# Patient Record
Sex: Male | Born: 1975 | Race: White | Hispanic: No | Marital: Married | Smoking: Former smoker
Health system: Southern US, Community
[De-identification: ages and names within clinical notes are randomized; demographics above are authoritative.]

## PROBLEM LIST (undated history)

## (undated) DIAGNOSIS — G35 Multiple sclerosis: Secondary | ICD-10-CM

## (undated) DIAGNOSIS — H539 Unspecified visual disturbance: Secondary | ICD-10-CM

## (undated) DIAGNOSIS — I10 Essential (primary) hypertension: Secondary | ICD-10-CM

## (undated) DIAGNOSIS — I4891 Unspecified atrial fibrillation: Secondary | ICD-10-CM

## (undated) HISTORY — DX: Unspecified visual disturbance: H53.9

## (undated) HISTORY — PX: CERVICAL FUSION: SHX112

## (undated) HISTORY — DX: Essential (primary) hypertension: I10

---

## 2016-07-01 ENCOUNTER — Ambulatory Visit
Admission: EM | Admit: 2016-07-01 | Discharge: 2016-07-01 | Disposition: A | Discharge status: Home / Self Care | Payer: BLUE CROSS/BLUE SHIELD | Source: Home / Self Care | Attending: Family Medicine | Admitting: Family Medicine

## 2016-07-01 ENCOUNTER — Inpatient Hospital Stay
Admission: EM | Admit: 2016-07-01 | Discharge: 2016-07-04 | DRG: 069 | Disposition: A | Discharge status: Home / Self Care | Payer: BLUE CROSS/BLUE SHIELD | Attending: Internal Medicine | Admitting: Internal Medicine

## 2016-07-01 ENCOUNTER — Emergency Department: Payer: BLUE CROSS/BLUE SHIELD

## 2016-07-01 DIAGNOSIS — I639 Cerebral infarction, unspecified: Secondary | ICD-10-CM | POA: Diagnosis not present

## 2016-07-01 DIAGNOSIS — R531 Weakness: Secondary | ICD-10-CM

## 2016-07-01 DIAGNOSIS — H538 Other visual disturbances: Secondary | ICD-10-CM | POA: Diagnosis present

## 2016-07-01 DIAGNOSIS — G35 Multiple sclerosis: Secondary | ICD-10-CM | POA: Diagnosis present

## 2016-07-01 DIAGNOSIS — R29704 NIHSS score 4: Secondary | ICD-10-CM | POA: Diagnosis present

## 2016-07-01 DIAGNOSIS — G459 Transient cerebral ischemic attack, unspecified: Secondary | ICD-10-CM | POA: Diagnosis not present

## 2016-07-01 DIAGNOSIS — I4891 Unspecified atrial fibrillation: Secondary | ICD-10-CM | POA: Diagnosis present

## 2016-07-01 DIAGNOSIS — Z8249 Family history of ischemic heart disease and other diseases of the circulatory system: Secondary | ICD-10-CM

## 2016-07-01 DIAGNOSIS — G8194 Hemiplegia, unspecified affecting left nondominant side: Secondary | ICD-10-CM | POA: Diagnosis present

## 2016-07-01 DIAGNOSIS — E785 Hyperlipidemia, unspecified: Secondary | ICD-10-CM | POA: Diagnosis present

## 2016-07-01 DIAGNOSIS — H547 Unspecified visual loss: Secondary | ICD-10-CM | POA: Diagnosis present

## 2016-07-01 DIAGNOSIS — Z823 Family history of stroke: Secondary | ICD-10-CM

## 2016-07-01 DIAGNOSIS — K219 Gastro-esophageal reflux disease without esophagitis: Secondary | ICD-10-CM | POA: Diagnosis present

## 2016-07-01 DIAGNOSIS — R29810 Facial weakness: Secondary | ICD-10-CM

## 2016-07-01 DIAGNOSIS — H5340 Unspecified visual field defects: Secondary | ICD-10-CM

## 2016-07-01 DIAGNOSIS — I10 Essential (primary) hypertension: Secondary | ICD-10-CM | POA: Diagnosis present

## 2016-07-01 DIAGNOSIS — F329 Major depressive disorder, single episode, unspecified: Secondary | ICD-10-CM | POA: Diagnosis present

## 2016-07-01 DIAGNOSIS — H02209 Unspecified lagophthalmos unspecified eye, unspecified eyelid: Secondary | ICD-10-CM | POA: Diagnosis present

## 2016-07-01 DIAGNOSIS — Z79899 Other long term (current) drug therapy: Secondary | ICD-10-CM

## 2016-07-01 DIAGNOSIS — Z981 Arthrodesis status: Secondary | ICD-10-CM

## 2016-07-01 LAB — URINALYSIS, ROUTINE W REFLEX MICROSCOPIC
BILIRUBIN URINE: NEGATIVE
Glucose, UA: NEGATIVE mg/dL
Hgb urine dipstick: NEGATIVE
KETONES UR: NEGATIVE mg/dL
LEUKOCYTES UA: NEGATIVE
NITRITE: NEGATIVE
PH: 5 (ref 5.0–8.0)
Protein, ur: NEGATIVE mg/dL
SPECIFIC GRAVITY, URINE: 1.021 (ref 1.005–1.030)

## 2016-07-01 LAB — COMPREHENSIVE METABOLIC PANEL
ALBUMIN: 4.8 g/dL (ref 3.5–5.0)
ALK PHOS: 48 U/L (ref 38–126)
ALT: 15 U/L — AB (ref 17–63)
ANION GAP: 9 (ref 5–15)
AST: 20 U/L (ref 15–41)
BUN: 15 mg/dL (ref 6–20)
CHLORIDE: 104 mmol/L (ref 101–111)
CO2: 27 mmol/L (ref 22–32)
Calcium: 9.3 mg/dL (ref 8.9–10.3)
Creatinine, Ser: 1.11 mg/dL (ref 0.61–1.24)
GFR calc non Af Amer: >60 mL/min (ref >60)
GLUCOSE: 102 mg/dL — AB (ref 65–99)
Potassium: 3.5 mmol/L (ref 3.5–5.1)
SODIUM: 140 mmol/L (ref 135–145)
Total Bilirubin: 0.7 mg/dL (ref 0.3–1.2)
Total Protein: 7.8 g/dL (ref 6.5–8.1)

## 2016-07-01 LAB — DIFFERENTIAL
BASOS PCT: 1 %
Basophils Absolute: 0.1 10*3/uL (ref 0–0.1)
EOS PCT: 6 %
Eosinophils Absolute: 0.7 10*3/uL (ref 0–0.7)
LYMPHS PCT: 35 %
Lymphs Abs: 3.8 10*3/uL — ABNORMAL HIGH (ref 1.0–3.6)
MONO ABS: 0.7 10*3/uL (ref 0.2–1.0)
Monocytes Relative: 6 %
NEUTROS ABS: 5.5 10*3/uL (ref 1.4–6.5)
NEUTROS PCT: 52 %

## 2016-07-01 LAB — CBC
HCT: 47.1 % (ref 40.0–52.0)
Hemoglobin: 15.7 g/dL (ref 13.0–18.0)
MCH: 29.3 pg (ref 26.0–34.0)
MCHC: 33.3 g/dL (ref 32.0–36.0)
MCV: 87.9 fL (ref 80.0–100.0)
PLATELETS: 267 10*3/uL (ref 150–440)
RBC: 5.35 MIL/uL (ref 4.40–5.90)
RDW: 13.5 % (ref 11.5–14.5)
WBC: 10.8 10*3/uL — AB (ref 3.8–10.6)

## 2016-07-01 LAB — URINE DRUG SCREEN, QUALITATIVE (ARMC ONLY)
Amphetamines, Ur Screen: NOT DETECTED
BARBITURATES, UR SCREEN: NOT DETECTED
Benzodiazepine, Ur Scrn: NOT DETECTED
COCAINE METABOLITE, UR ~~LOC~~: NOT DETECTED
Cannabinoid 50 Ng, Ur ~~LOC~~: NOT DETECTED
MDMA (Ecstasy)Ur Screen: NOT DETECTED
METHADONE SCREEN, URINE: NOT DETECTED
OPIATE, UR SCREEN: NOT DETECTED
Phencyclidine (PCP) Ur S: NOT DETECTED
Tricyclic, Ur Screen: NOT DETECTED

## 2016-07-01 LAB — GLUCOSE, CAPILLARY: GLUCOSE-CAPILLARY: 107 mg/dL — AB (ref 65–99)

## 2016-07-01 LAB — PROTIME-INR
INR: 0.95
PROTHROMBIN TIME: 12.7 s (ref 11.4–15.2)

## 2016-07-01 LAB — ETHANOL: Alcohol, Ethyl (B): <5 mg/dL (ref <5)

## 2016-07-01 LAB — TROPONIN I: Troponin I: <0.03 ng/mL (ref <0.03)

## 2016-07-01 LAB — APTT: aPTT: 30 seconds (ref 24–36)

## 2016-07-01 MED ORDER — ASPIRIN 300 MG RE SUPP: 300.0000 mg | Freq: Every day | RECTAL | Status: DC

## 2016-07-01 MED ORDER — ACETAMINOPHEN 325 MG PO TABS
650.0000 mg | ORAL_TABLET | ORAL | Status: DC | PRN
  Administered 2016-07-03 – 2016-07-04 (×3): 650 mg via ORAL
  Filled 2016-07-01 (×3): qty 2

## 2016-07-01 MED ORDER — METOPROLOL TARTRATE 50 MG PO TABS
50.0000 mg | ORAL_TABLET | Freq: Two times a day (BID) | ORAL | Status: DC
  Administered 2016-07-02 – 2016-07-04 (×5): 50 mg via ORAL
  Filled 2016-07-01 (×5): qty 1

## 2016-07-01 MED ORDER — SODIUM CHLORIDE 0.9 % IV SOLN
INTRAVENOUS | Status: DC
  Administered 2016-07-01: via INTRAVENOUS

## 2016-07-01 MED ORDER — ASPIRIN 81 MG PO CHEW
324.0000 mg | CHEWABLE_TABLET | Freq: Once | ORAL | Status: AC
  Administered 2016-07-01: 324 mg via ORAL
  Filled 2016-07-01: qty 4

## 2016-07-01 MED ORDER — STROKE: EARLY STAGES OF RECOVERY BOOK
Freq: Once | Status: AC
  Administered 2016-07-01

## 2016-07-01 MED ORDER — SERTRALINE HCL 50 MG PO TABS
100.0000 mg | ORAL_TABLET | Freq: Every day | ORAL | Status: DC
  Administered 2016-07-02 – 2016-07-04 (×3): 100 mg via ORAL
  Filled 2016-07-01 (×3): qty 2

## 2016-07-01 MED ORDER — ENOXAPARIN SODIUM 40 MG/0.4ML ~~LOC~~ SOLN
40.0000 mg | SUBCUTANEOUS | Status: DC
  Administered 2016-07-02 – 2016-07-03 (×2): 40 mg via SUBCUTANEOUS
  Filled 2016-07-01 (×2): qty 0.4

## 2016-07-01 MED ORDER — ASPIRIN 325 MG PO TABS
325.0000 mg | ORAL_TABLET | Freq: Every day | ORAL | Status: DC
  Administered 2016-07-02 – 2016-07-04 (×3): 325 mg via ORAL
  Filled 2016-07-01 (×3): qty 1

## 2016-07-01 MED ORDER — ACETAMINOPHEN 160 MG/5ML PO SOLN: 650.0000 mg | ORAL | Status: DC | PRN

## 2016-07-01 MED ORDER — ACETAMINOPHEN 650 MG RE SUPP: 650.0000 mg | RECTAL | Status: DC | PRN

## 2016-07-01 NOTE — H&P (Signed)
[facility]   PATIENT NAME: Sean Espinoza    MR#:  [id number]  DATE OF BIRTH:  [birth year]  DATE OF ADMISSION:  07/01/2016  PRIMARY CARE PHYSICIAN: No PCP Per Patient   REQUESTING/REFERRING PHYSICIAN: Sharyn Creamer, MD  CHIEF COMPLAINT:   Chief Complaint  Patient presents with  . Code Stroke    HISTORY OF PRESENT ILLNESS:  Sean Espinoza  is a 41 y.o. male with a known history of Atrial fibrillation being admitted for possible stroke and or multiple sclerosis.  He reports having left eye blurry vision for about 2 weeks associated with some headache.  He notes the symptoms due to his busy work schedule.  Started having roaring sound in his left ear about 3-4 days ago.  Around 1 PM today he started having some left sided facial droop and weakness which persisted and led him to come to the emergency department.  He also reports some questionable left arm and leg weakness mainly noticed at urgent care visit.  Before coming down here.  His initial CT scan was negative. TeleNeurology evaluated him, he was out of TPA window. , they recommended admission with MRI, MRA and evaluation for possible stroke and/or multiple sclerosis  PAST MEDICAL HISTORY:  No past medical history on file. Atrial fibrillation  PAST SURGICAL HISTORY:  No past surgical history on file. C3 to C7 cervical neck fusion - had one surgery 3 years ago, another one 10 years ago while he was in [place]  SOCIAL HISTORY:   Social History  Substance Use Topics  . Smoking status: Never Smoker  . Smokeless tobacco: Never Used  . Alcohol use No  He is a Management consultant for [employer]  FAMILY HISTORY:  No family history on file. Grant father had CVA  Mother and father with hypertension  DRUG ALLERGIES:  No Known Allergies  REVIEW OF SYSTEMS:   Review of Systems  Constitutional: Negative for chills, fever and weight loss.  HENT: Negative for nosebleeds and sore throat.    Eyes: Negative for blurred vision.  Respiratory: Negative for cough, shortness of breath and wheezing.   Cardiovascular: Negative for chest pain, orthopnea, leg swelling and PND.  Gastrointestinal: Negative for abdominal pain, constipation, diarrhea, heartburn, nausea and vomiting.  Genitourinary: Negative for dysuria and urgency.  Musculoskeletal: Negative for back pain.  Skin: Negative for rash.  Neurological: Positive for sensory change and focal weakness. Negative for dizziness, speech change and headaches.  Endo/Heme/Allergies: Does not bruise/bleed easily.  Psychiatric/Behavioral: Negative for depression.   MEDICATIONS AT HOME:   Prior to Admission medications   Medication Sig Start Date End Date Taking? Authorizing Provider  metoprolol (LOPRESSOR) 50 MG tablet Take 50 mg by mouth 2 (two) times daily.   Yes Historical Provider, MD  sertraline (ZOLOFT) 100 MG tablet Take 100 mg by mouth daily.   Yes Historical Provider, MD      VITAL SIGNS:  Blood pressure (!) 145/96, pulse 72, temperature 98 F (36.7 C), temperature source Oral, resp. rate 19, height 6\' 1"  (1.854 m), weight 132.9 kg (293 lb), SpO2 92 %.  PHYSICAL EXAMINATION:  Physical Exam  GENERAL:  41 y.o.-year-old patient lying in the bed with no acute distress.  EYES: Pupils equal, round, reactive to light and accommodation. No scleral icterus. Extraocular muscles intact.  HEENT: Head atraumatic, normocephalic. Oropharynx and nasopharynx clear.  NECK:  Supple, no jugular venous distention. No thyroid enlargement, no tenderness.  LUNGS: Normal breath sounds bilaterally,  no wheezing, rales,rhonchi or crepitation. No use of accessory muscles of respiration.  CARDIOVASCULAR: S1, S2 normal. No murmurs, rubs, or gallops.  ABDOMEN: Soft, nontender, nondistended. Bowel sounds present. No organomegaly or mass.  EXTREMITIES: No pedal edema, cyanosis, or clubbing.  NEUROLOGIC: Cranial nerves II through XII are intact. Muscle  strength 5/5 in all extremities. Sensation intact. Gait not checked. Left sided facial droop PSYCHIATRIC: The patient is alert and oriented x 3.  SKIN: No obvious rash, lesion, or ulcer.   LABORATORY PANEL:   CBC  Recent Labs Lab 07/01/16 2010  WBC 10.8*  HGB 15.7  HCT 47.1  PLT 267   ------------------------------------------------------------------------------------------------------------------  Chemistries   Recent Labs Lab 07/01/16 2010  NA 140  K 3.5  CL 104  CO2 27  GLUCOSE 102*  BUN 15  CREATININE 1.11  CALCIUM 9.3  AST 20  ALT 15*  ALKPHOS 48  BILITOT 0.7   ------------------------------------------------------------------------------------------------------------------  Cardiac Enzymes  Recent Labs Lab 07/01/16 2010  TROPONINI <0.03   ------------------------------------------------------------------------------------------------------------------  RADIOLOGY:  Ct Head Code Stroke W/o Cm  Addendum Date: 07/01/2016   ADDENDUM REPORT: 07/01/2016 20:18 ADDENDUM: These results were called by telephone at the time of interpretation on 07/01/2016 at 8:18 pm to Dr. Jene Every , who verbally acknowledged these results. Electronically Signed   By: Deatra Robinson M.D.   On: 07/01/2016 20:18   Result Date: 07/01/2016 CLINICAL DATA:  Code stroke.  Left-sided facial droop and weakness. EXAM: CT HEAD WITHOUT CONTRAST TECHNIQUE: Contiguous axial images were obtained from the base of the skull through the vertex without intravenous contrast. COMPARISON:  None. FINDINGS: Brain: No mass lesion, intraparenchymal hemorrhage or extra-axial collection. No evidence of acute cortical infarct. Brain parenchyma and CSF-containing spaces are normal for age. Vascular: No hyperdense vessel or unexpected calcification. Skull: Normal visualized skull base, calvarium and extracranial soft tissues. Sinuses/Orbits: No sinus fluid levels or advanced mucosal thickening. No mastoid effusion.  Normal orbits. ASPECTS [facility] Stroke Program Early CT Score) - Ganglionic level infarction (caudate, lentiform nuclei, internal capsule, insula, M1-M3 cortex): 7 - Supraganglionic infarction (M4-M6 cortex): 3 Total score (0-10 with 10 being normal): 10 IMPRESSION: 1. Normal head CT. 2. ASPECTS is 10. I attempted to contact Dr. Jene Every at 8:02 p.m. and am currently awaiting a return phone call. Electronically Signed: By: Deatra Robinson M.D. On: 07/01/2016 20:06   IMPRESSION AND PLAN:   41 year old male with a known history of atrial fibrillation being admitted for stroke workup   * Left-sided weakness and facial droop - Initial head CT negative - We will get MRI and MRA of the brain - Echo and carotid Dopplers - Neurology consultation - Aspirin and statin - Check fasting lipid profile  * History of atrial fibrillation - He is now in normal sinus rhythm - Not on any anticoagulation, continue metoprolol for rate control  * Depression - Continue Zoloft  * Hypertension - Continue metoprolol for blood pressure control    All the records are reviewed and case discussed with ED provider. Management plans discussed with the patient, family and they are in agreement.  CODE STATUS: FULL CODE  TOTAL TIME TAKING CARE OF THIS PATIENT: 45 minutes.    Delfino Lovett M.D on 07/01/2016 at 10:00 PM  Between 7am to 6pm - Pager - [phone]  After 6pm go to [email or web address] - Scientist, research (life sciences) [facility]  Office  [phone]  CC: Primary care physician; No PCP Per Patient   Note: This dictation  was prepared with Dragon dictation along with smaller phrase technology. Any transcriptional errors that result from this process are unintentional.

## 2016-07-01 NOTE — ED Notes (Signed)
Dr Sherryll Burger in to see pt regarding admission

## 2016-07-01 NOTE — ED Provider Notes (Signed)
[facility]    CSN: [id number] Arrival date & time: 07/01/16  1749     History   Chief Complaint Chief Complaint  Patient presents with  . Tinnitus  . Eye Problem    left eye cant focus    HPI Sean Espinoza is a 41 y.o. male.   Patient has wife comes in today with a history of him having difficulty focusing with his left eye for at least 1-2 weeks. Patient states S been going on but then today about 12 about 6 hours ago he started having weakness on left side of his face including his mouth I and jaw. He states she's never had anything like this before. He and his wife has a daughter who recently had or was diagnosed with Bell's palsy he's also has a history of hypertension and a history of A. fib and was placed on Toprol for that. He denies interval with weakness on the left side. Nothing chest pain or shortness of breath. No pertinent family medical history other than the grandmother who's had heart disease and heart attacks. He never smoked and he has no known drug allergies.   The history is provided by the patient and the spouse.  Eye Problem  Location:  Left eye Severity:  Unable to specify Onset quality:  Sudden Timing:  Constant Progression:  Worsening Chronicity:  New Context: not contact lens problem   Relieved by:  Nothing Ineffective treatments:  None tried Associated symptoms: blurred vision and double vision   Associated symptoms: no numbness, no photophobia, no redness, no vomiting and no weakness   Risk factors: no conjunctival hemorrhage, no exposure to pinkeye, no previous injury to eye and no recent URI     History reviewed. No pertinent past medical history.  There are no active problems to display for this patient.   History reviewed. No pertinent surgical history.     Home Medications    Prior to Admission medications   Medication Sig Start Date End Date Taking? Authorizing Provider  metoprolol (LOPRESSOR) 50 MG tablet Take 50  mg by mouth 2 (two) times daily.   Yes Historical Provider, MD  sertraline (ZOLOFT) 100 MG tablet Take 100 mg by mouth daily.   Yes Historical Provider, MD    Family History History reviewed. No pertinent family history.  Social History Social History  Substance Use Topics  . Smoking status: Never Smoker  . Smokeless tobacco: Never Used  . Alcohol use No     Allergies   Patient has no known allergies.   Review of Systems Review of Systems  Eyes: Positive for blurred vision and double vision. Negative for photophobia and redness.  Gastrointestinal: Negative for vomiting.  Neurological: Negative for weakness and numbness.  All other systems reviewed and are negative.    Physical Exam Triage Vital Signs ED Triage Vitals  Enc Vitals Group     BP 07/01/16 1824 (!) 144/91     Pulse Rate 07/01/16 1824 79     Resp 07/01/16 1824 18     Temp 07/01/16 1824 98.4 F (36.9 C)     Temp Source 07/01/16 1824 Oral     SpO2 07/01/16 1824 98 %     Weight 07/01/16 1824 294 lb (133.4 kg)     Height 07/01/16 1823 6\' 1"  (1.854 m)     Head Circumference --      Peak Flow --      Pain Score 07/01/16 1829 0  Pain Loc --      Pain Edu? --      Excl. in GC? --    No data found.   Updated Vital Signs BP (!) 144/91 (BP Location: Left Arm)   Pulse 79   Temp 98.4 F (36.9 C) (Oral)   Resp 18   Ht 6\' 1"  (1.854 m)   Wt 294 lb (133.4 kg)   SpO2 98%   BMI 38.79 kg/m   Visual Acuity Right Eye Distance: 20/20 Left Eye Distance: 20/50 Bilateral Distance: 20/20  Right Eye Near:   Left Eye Near:    Bilateral Near:     Physical Exam  Constitutional: He appears well-developed and well-nourished.  HENT:  Head: Normocephalic and atraumatic.  Right Ear: Hearing, tympanic membrane, external ear and ear canal normal.  Left Ear: Hearing, tympanic membrane, external ear and ear canal normal.  Nose: Nose normal.  Mouth/Throat: Uvula is midline and oropharynx is clear and moist. No oral  lesions.  Eyes: Conjunctivae are normal. Pupils are equal, round, and reactive to light.  Neck: Normal range of motion. Neck supple.  Cardiovascular: Normal rate and regular rhythm.   Pulmonary/Chest: Effort normal.  Musculoskeletal: Normal range of motion. He exhibits no edema or deformity.  Neurological: He is alert. He has normal reflexes. He is not disoriented. He displays no tremor. A cranial nerve deficit is present. No sensory deficit. He exhibits normal muscle tone. He displays no seizure activity.  Patient has weakness of the left upper and lower extremity. His eye gait sometimes is disconjugate with the left I do not seem to move as well as the right eye. His tongue is unable to protrude out completely and he does have marked facial weakness of the left lower jaw area.  Skin: Skin is warm.  Psychiatric: He has a normal mood and affect.  Vitals reviewed.    UC Treatments / Results  Labs (all labs ordered are listed, but only abnormal results are displayed) Labs Reviewed - No data to display  EKG  EKG Interpretation None       Radiology No results found.  Procedures Procedures (including critical care time)  Medications Ordered in UC Medications - No data to display   Initial Impression / Assessment and Plan / UC Course  I have reviewed the triage vital signs and the nursing notes.  Pertinent labs & imaging results that were available during my care of the patient were reviewed by me and considered in my medical decision making (see chart for details).     Explained to him and his wife concerned about the left upper arm and left lower leg weakness. He may have had those weakness before but I do not know that. Also his gait sometimes disconjugate and he has more weakness of the tongue and lower face and I would expect with a simple Bell's palsy. With these findings a history of A. fib I'm strongly suggesting they go to the ED for evaluation. Since everything started  about 6-7 hours ago he's beyond stroke busting notify [facility] of the impending arrival. I his wife checks his upper and lower strength and she agrees there is a discrepancy there  Final Clinical Impressions(s) / UC Diagnoses   Final diagnoses:  None    New Prescriptions New Prescriptions   No medications on file     Hassan Rowan, MD 07/01/16 1930

## 2016-07-01 NOTE — ED Notes (Signed)
Family at bedside. 

## 2016-07-01 NOTE — ED Notes (Signed)
Report given to Tobi Bastos at [facility] ED

## 2016-07-01 NOTE — ED Notes (Signed)
CODE STROKE CALLED TO 333 

## 2016-07-01 NOTE — ED Provider Notes (Signed)
[facility] Emergency Department Provider Note  ____________________________________________   First MD Initiated Contact with Patient 07/01/16 2016     (approximate)  I have reviewed the triage vital signs and the nursing notes.   HISTORY  Chief Complaint Code Stroke   HPI Sean Espinoza is a 41 y.o. male presents for evaluation of weakness over the left face and numbness ov and left arm starting about 1 PM today. This has been preceded by a feeling of blurring of vision in his left eye, and also some strange ringing in his left ear over the last 2 weeks.  Denies any tick bite. He does have a history of atrial fibrillation which resolved 5 years ago and he is currently on metoprolol  No headache. No fevers or chills. No rash. No nausea or vomiting. He has not noticed weakness in his left hand, but reports that on testing at urgent care they were concerned he had weakness he has noticed numbness  No history of multiple sclerosis  No past medical history on file.  There are no active problems to display for this patient.   No past surgical history on file.  Prior to Admission medications   Medication Sig Start Date End Date Taking? Authorizing Provider  metoprolol (LOPRESSOR) 50 MG tablet Take 50 mg by mouth 2 (two) times daily.    Historical Provider, MD  sertraline (ZOLOFT) 100 MG tablet Take 100 mg by mouth daily.    Historical Provider, MD    Allergies Patient has no known allergies.  No family history on file.  Social History Social History  Substance Use Topics  . Smoking status: Never Smoker  . Smokeless tobacco: Never Used  . Alcohol use No    Review of Systems Constitutional: No fever/chills Eyes: Some loss of vision in the left lateral eye starting about 2 weeks ago with some discomfort in the eye ENT: No sore throat. Cardiovascular: Denies chest pain. Respiratory: Denies shortness of breath. Gastrointestinal: No abdominal  pain.  No nausea, no vomiting.  No diarrhea.  No constipation. Genitourinary: Negative for dysuria. Musculoskeletal: Negative for back pain. Skin: Negative for rash. Neurological: See history of present illness  10-point ROS otherwise negative.  ____________________________________________   PHYSICAL EXAM:  VITAL SIGNS: ED Triage Vitals  Enc Vitals Group     BP 07/01/16 1947 (!) 170/110     Pulse Rate 07/01/16 1947 84     Resp 07/01/16 1947 20     Temp 07/01/16 1947 98 F (36.7 C)     Temp Source 07/01/16 1947 Oral     SpO2 07/01/16 1947 97 %     Weight 07/01/16 1948 293 lb (132.9 kg)     Height 07/01/16 1948 6\' 1"  (1.854 m)     Head Circumference --      Peak Flow --      Pain Score 07/01/16 1946 0     Pain Loc --      Pain Edu? --      Excl. in GC? --     Constitutional: Alert and oriented. Well appearing and in no acute distress. Eyes: Conjunctivae are normal. PERRL. EOMI.There is a slight loss of vision in the left lateral field Head: Atraumatic. Nose: No congestion/rhinnorhea. Mouth/Throat: Mucous membranes are moist.  Oropharynx non-erythematous. Neck: No stridor.   Cardiovascular: Normal rate, regular rhythm. Grossly normal heart sounds.  Good peripheral circulation. Respiratory: Normal respiratory effort.  No retractions. Lungs CTAB. Gastrointestinal: Soft and nontender. No  distention.  Musculoskeletal: No lower extremity tenderness nor edema.  No joint effusions. Neurologic:  Normal speech and language.  NIH score equals 3, performed by me at bedside. The patient has no pronator drift. The patient has normal cranial nerve exam except for some moderate weakness of the left seventh cranial nerve. Extraocular movements are normal. Visual fields are normal. Patient has 5 out of 5 strength in all extremities. There is no numbness or gross, acute sensory abnormality in the extremities bilaterally except over the left face and left arm where he has slight decrease  in sensation. No speech disturbance. No dysarthria. No aphasia. No ataxia. Normal finger nose finger bilat. Patient speaking in full and clear sentences.   Skin:  Skin is warm, dry and intact. No rash noted. Psychiatric: Mood and affect are normal. Speech and behavior are normal.  ____________________________________________   LABS (all labs ordered are listed, but only abnormal results are displayed)  Labs Reviewed  CBC - Abnormal; Notable for the following:       Result Value   WBC 10.8 (*)    All other components within normal limits  DIFFERENTIAL - Abnormal; Notable for the following:    Lymphs Abs 3.8 (*)    All other components within normal limits  COMPREHENSIVE METABOLIC PANEL - Abnormal; Notable for the following:    Glucose, Bld 102 (*)    ALT 15 (*)    All other components within normal limits  PROTIME-INR  APTT  TROPONIN I  ETHANOL  RAPID URINE DRUG SCREEN, HOSP PERFORMED  URINALYSIS, ROUTINE W REFLEX MICROSCOPIC  CBG MONITORING, ED   ____________________________________________  EKG  ED ECG REPORT I, Wilhemenia Camba, the attending physician, personally viewed and interpreted this ECG.  Date: 07/01/2016 EKG Time: 2040 Rate: 76 Rhythm: normal sinus rhythm QRS Axis: normal Intervals: normal ST/T Wave abnormalities: normal Conduction Disturbances: none Narrative Interpretation: unremarkable aside from some minimal Q waves in inferiorly  ____________________________________________  RADIOLOGY  Ct Head Code Stroke W/o Cm  Addendum Date: 07/01/2016   ADDENDUM REPORT: 07/01/2016 20:18 ADDENDUM: These results were called by telephone at the time of interpretation on 07/01/2016 at 8:18 pm to Dr. Jene Every , who verbally acknowledged these results. Electronically Signed   By: Deatra Robinson M.D.   On: 07/01/2016 20:18   Result Date: 07/01/2016 CLINICAL DATA:  Code stroke.  Left-sided facial droop and weakness. EXAM: CT HEAD WITHOUT CONTRAST TECHNIQUE:  Contiguous axial images were obtained from the base of the skull through the vertex without intravenous contrast. COMPARISON:  None. FINDINGS: Brain: No mass lesion, intraparenchymal hemorrhage or extra-axial collection. No evidence of acute cortical infarct. Brain parenchyma and CSF-containing spaces are normal for age. Vascular: No hyperdense vessel or unexpected calcification. Skull: Normal visualized skull base, calvarium and extracranial soft tissues. Sinuses/Orbits: No sinus fluid levels or advanced mucosal thickening. No mastoid effusion. Normal orbits. ASPECTS [facility] Stroke Program Early CT Score) - Ganglionic level infarction (caudate, lentiform nuclei, internal capsule, insula, M1-M3 cortex): 7 - Supraganglionic infarction (M4-M6 cortex): 3 Total score (0-10 with 10 being normal): 10 IMPRESSION: 1. Normal head CT. 2. ASPECTS is 10. I attempted to contact Dr. Jene Every at 8:02 p.m. and am currently awaiting a return phone call. Electronically Signed: By: Deatra Robinson M.D. On: 07/01/2016 20:06      ____________________________________________   PROCEDURES  Procedure(s) performed: None  Procedures  Critical Care performed: Yes, see critical care note(s)  CRITICAL CARE Performed by: Sharyn Creamer   Total critical care  time: 40 minutes  Critical care time was exclusive of separately billable procedures and treating other patients.  Critical care was necessary to treat or prevent imminent or life-threatening deterioration.  Critical care was time spent personally by me on the following activities: development of treatment plan with patient and/or surrogate as well as nursing, discussions with consultants, evaluation of patient's response to treatment, examination of patient, obtaining history from patient or surrogate, ordering and performing treatments and interventions, ordering and review of laboratory studies, ordering and review of radiographic studies, pulse oximetry and  re-evaluation of patient's condition.  ____________________________________________   INITIAL IMPRESSION / ASSESSMENT AND PLAN / ED COURSE  Pertinent labs & imaging results that were available during my care of the patient were reviewed by me and considered in my medical decision making (see chart for details).  Patient is not a TPA candidate. He is outside the TPA window with symptomatology today starting at approximately 1 PM. He does also have 2 weeks of preceding symptoms including left eye discomfort and some blurring of vision, which given his age and findings is somewhat suspicious for other neurologic abnormality such as multiple sclerosis as a possible cause. At this point, patient has been seen by tele-neurology, they recommended admission for MRI and MRA and further workup. He is currently not a candidate for neuro interventional care given time window and his minimal symptomatology, and I discussed this with tele-neurology. He is also not a TPA candidate. We given aspirin by mouth, and admitted for further workup.      ____________________________________________   FINAL CLINICAL IMPRESSION(S) / ED DIAGNOSES  Final diagnoses:  Acute left-sided weakness  Visual field cut      NEW MEDICATIONS STARTED DURING THIS VISIT:  New Prescriptions   No medications on file     Note:  This document was prepared using Dragon voice recognition software and may include unintentional dictation errors.     Sharyn Creamer, MD 07/01/16 2046

## 2016-07-01 NOTE — ED Notes (Signed)
ED Provider at bedside. 

## 2016-07-01 NOTE — ED Notes (Signed)
Pt uprite on stretcher in exam room with no distress, no c/o voiced; pt remains A&Ox3, MAEW, grips =& strong, PERRL

## 2016-07-01 NOTE — ED Notes (Signed)
Pt ambulatory to bathroom without difficulty or distress noted for urine sample

## 2016-07-01 NOTE — ED Notes (Addendum)
SOC in progress.  

## 2016-07-01 NOTE — ED Triage Notes (Addendum)
Pt c/o left sided facial drooping today, ringing in the left ear for 2 days. Left eye isnt focusing for the last week. His lips midday way to the left are numb.  RN checked strength in arms and legs, and there is a difference in strength on the left side it is weaker.

## 2016-07-02 ENCOUNTER — Observation Stay: Payer: BLUE CROSS/BLUE SHIELD

## 2016-07-02 ENCOUNTER — Observation Stay (HOSPITAL_BASED_OUTPATIENT_CLINIC_OR_DEPARTMENT_OTHER)
Admit: 2016-07-02 | Discharge: 2016-07-02 | Disposition: A | Discharge status: Home / Self Care | Payer: BLUE CROSS/BLUE SHIELD | Attending: Internal Medicine | Admitting: Internal Medicine

## 2016-07-02 DIAGNOSIS — G459 Transient cerebral ischemic attack, unspecified: Secondary | ICD-10-CM

## 2016-07-02 DIAGNOSIS — R29810 Facial weakness: Secondary | ICD-10-CM

## 2016-07-02 DIAGNOSIS — R531 Weakness: Secondary | ICD-10-CM

## 2016-07-02 DIAGNOSIS — H5340 Unspecified visual field defects: Secondary | ICD-10-CM

## 2016-07-02 LAB — LIPID PANEL
CHOLESTEROL: 197 mg/dL (ref 0–200)
HDL: 29 mg/dL — ABNORMAL LOW (ref >40)
LDL CALC: 127 mg/dL — AB (ref 0–99)
Total CHOL/HDL Ratio: 6.8 RATIO
Triglycerides: 206 mg/dL — ABNORMAL HIGH (ref <150)
VLDL: 41 mg/dL — AB (ref 0–40)

## 2016-07-02 LAB — ECHOCARDIOGRAM COMPLETE
HEIGHTINCHES: 73 in
Weight: 4688 oz

## 2016-07-02 MED ORDER — PANTOPRAZOLE SODIUM 40 MG PO TBEC
40.0000 mg | DELAYED_RELEASE_TABLET | Freq: Every day | ORAL | Status: DC
  Administered 2016-07-02 – 2016-07-04 (×3): 40 mg via ORAL
  Filled 2016-07-02 (×3): qty 1

## 2016-07-02 MED ORDER — GADOBENATE DIMEGLUMINE 529 MG/ML IV SOLN
20.0000 mL | Freq: Once | INTRAVENOUS | Status: AC | PRN
  Administered 2016-07-02: 10:00:00 20 mL via INTRAVENOUS

## 2016-07-02 MED ORDER — METHYLPREDNISOLONE SODIUM SUCC 1000 MG IJ SOLR
1000.0000 mg | Freq: Every day | INTRAMUSCULAR | Status: AC
  Administered 2016-07-02 – 2016-07-04 (×3): 1000 mg via INTRAVENOUS
  Filled 2016-07-02 (×3): qty 8

## 2016-07-02 MED ORDER — ATORVASTATIN CALCIUM 20 MG PO TABS
40.0000 mg | ORAL_TABLET | Freq: Every day | ORAL | Status: DC
  Administered 2016-07-02 – 2016-07-03 (×2): 40 mg via ORAL
  Filled 2016-07-02 (×2): qty 2

## 2016-07-02 NOTE — Evaluation (Signed)
Physical Therapy Evaluation Patient Details Name: Sean Espinoza MRN: [id number] DOB: [birth year] Today's Date: 07/02/2016   History of Present Illness  presented to ER secondary to subacute onset of blurred vision to L eye, roaring sound in L ear, decreased strength/sensation in L face and UE; admitted to rule out acute CVA.  MRI significant for white matter lesions consistent with MS diagnosis (new to patient)  Clinical Impression  Upon evaluation, patient alert and oriented; follows all commands and demonstrates good safety awareness/insight.  Demonstrates mild weakness and delayed speed of activation/coordination of L UE with persistent sensory deficits in L face, L UE.  Intermittent dizziness with visual tracking, but resolves with accommodation.  Able to complete bed mobility, sit/stand, basic transfers and gait (200') without assist device, mod indep/indep.  Good cadence and overall gait speed with good gait symmetry and mechanics.  BERG score 53/55 indicative of very minimal balance deficits; minimal fall risk with activity. Patient without significant mobility deficits related to acute diagnosis at this time.  Did educate about energy conservation, activity pacing and pathological response to heat in light of new MS diagnosis. Patient voiced understanding. No further acute PT needs identified at this time.  Will complete initial order; please reconsult should needs change.    Follow Up Recommendations No PT follow up    Equipment Recommendations       Recommendations for Other Services       Precautions / Restrictions Precautions Precautions: None Restrictions Weight Bearing Restrictions: No      Mobility  Bed Mobility Overal bed mobility: Independent             General bed mobility comments: cautious, no LOB, good safety awareness  Transfers Overall transfer level: Modified independent               General transfer comment: able to complete from bed  surface, no UE support required; good LE strength/power and control  Ambulation/Gait Ambulation/Gait assistance: Modified independent (Device/Increase time) Ambulation Distance (Feet): 200 Feet Assistive device: None       General Gait Details: reciprocal stepping pattern with good arm swing, trunk rotation; able to turn head and complete dynamic gait components without LOB or safety concern.  Stairs            Wheelchair Mobility    Modified Rankin (Stroke Patients Only)       Balance Overall balance assessment: Modified Independent                               Standardized Balance Assessment Standardized Balance Assessment : Berg Balance Test Berg Balance Test Sit to Stand: Able to stand without using hands and stabilize independently Standing Unsupported: Able to stand safely 2 minutes Sitting with Back Unsupported but Feet Supported on Floor or Stool: Able to sit safely and securely 2 minutes Stand to Sit: Sits safely with minimal use of hands Transfers: Able to transfer safely, minor use of hands Standing Unsupported with Eyes Closed: Able to stand 10 seconds with supervision Standing Ubsupported with Feet Together: Able to place feet together independently and stand 1 minute safely From Standing, Reach Forward with Outstretched Arm: Can reach confidently >25 cm (10") From Standing Position, Pick up Object from Floor: Able to pick up shoe safely and easily From Standing Position, Turn to Look Behind Over each Shoulder: Looks behind one side only/other side shows less weight shift Turn 360 Degrees: Able to turn 360  degrees safely in 4 seconds or less Standing Unsupported, Alternately Place Feet on Step/Stool: Able to stand independently and safely and complete 8 steps in 20 seconds Standing Unsupported, One Foot in Front: Able to plae foot ahead of the other independently and hold 30 seconds Standing on One Leg: Able to lift leg independently and hold >  10 seconds Total Score: 53         Pertinent Vitals/Pain Pain Assessment: No/denies pain    Home Living Family/patient expects to be discharged to:: Private residence Living Arrangements: Alone (recently moved to area for work; family still back in [place]) Available Help at Discharge: Family;Available 24 hours/day ([facility], wife/wife's family could be there 24/7 if needed; in [place], mother could be there 24/7 initially if needed) Type of Home: Mobile home (home in [place]; planning to return there upon discharge) Home Access: Stairs to enter Entrance Stairs-Rails: Can reach both;Left;Right Entrance Stairs-Number of Steps: 4 Home Layout: One level Home Equipment: None      Prior Function Level of Independence: Independent         Comments: Indep with ADLs, household and community mobility without assist device; 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 Strength grossly 4-/5 throughout L UE with decreased sensation, coordination and speed of activation) LUE Deficits / Details: grossly 4-/5, except tricep 3+/5; AROM WFL; decreased sensation (light touch, sharp/dull) on LUE  LUE Sensation: decreased light touch    Lower Extremity Assessment Lower Extremity Assessment:  (L LE grossly 4+ to 5/5 without sensory deficit reported.  Negative babinski, clonus.  R LE grossly WFL)    Cervical / Trunk Assessment Cervical / Trunk Assessment: Normal  Communication   Communication: No difficulties  Cognition Arousal/Alertness: Awake/alert Behavior During Therapy: WFL for tasks assessed/performed Overall Cognitive Status: Within Functional Limits for tasks assessed                                 General Comments: reports mild dizziness that "comes and goes"       General Comments      Exercises  Other Exercises Other Exercises: Educated patient on role of heat sensitivity and activity pacing/energy conservation with MS diagnosis; patient voiced understanding/agreement. Other Exercises: Mild vertical nystagmus noted with upward gaze, decreased L eye convergence; mild L facial droop.  May benefit from neuro-opthamology consult   Assessment/Plan    PT Assessment Patent does not need any further PT services  PT Problem List         PT Treatment Interventions      PT Goals (Current goals can be found in the Care Plan section)  Acute Rehab PT Goals Patient Stated Goal: go home PT Goal Formulation: All assessment and education complete, DC therapy Time For Goal Achievement: 07/16/16    Frequency     Barriers to discharge        Co-evaluation               End of Session   Activity Tolerance: Patient tolerated treatment well Patient left: in bed;with call bell/phone within reach   PT Visit Diagnosis: Muscle weakness (generalized) (M62.81);Hemiplegia and hemiparesis Hemiplegia - Right/Left: Left Hemiplegia - dominant/non-dominant: Non-dominant Hemiplegia - caused by: Other cerebrovascular disease    Time: 9604-5409 PT Time  Calculation (min) (ACUTE ONLY): 18 min   Charges:   PT Evaluation $PT Eval Low Complexity: 1 Procedure     PT G Codes:   PT G-Codes **NOT FOR INPATIENT CLASS** Functional Assessment Tool Used: Berg;Clinical judgement;AM-PAC 6 Clicks Basic Mobility Functional Limitation: Mobility: Walking and moving around Mobility: Walking and Moving Around Current Status (O9629): At least 1 percent but less than 20 percent impaired, limited or restricted Mobility: Walking and Moving Around Goal Status [phone]): At least 1 percent but less than 20 percent impaired, limited or restricted Mobility: Walking and Moving Around Discharge Status [phone]): At least 1 percent but less than 20 percent impaired, limited or restricted   Pranit Owensby H. Manson Passey, PT, DPT,  NCS 07/02/16, 5:24 PM [phone]

## 2016-07-02 NOTE — Progress Notes (Signed)
*  PRELIMINARY RESULTS* Echocardiogram 2D Echocardiogram has been performed.  Sean Espinoza 07/02/2016, 2:04 PM

## 2016-07-02 NOTE — Progress Notes (Signed)
SLP Cancellation Note  Patient Details Name: Sean Espinoza MRN: [id number] DOB: [birth year]   Cancelled treatment:       Reason Eval/Treat Not Completed: SLP screened, no needs identified, will sign off (chart reviewed; NSG and pt/wife consulted). Pt denied any difficulty swallowing and is currently on a regular diet. Pt conversed at conversational level w/out deficits noted; pt and wife denied any speech-language deficits.  No further skilled ST services indicated as pt appears at his baseline. Pt agreed. NSG to reconsult if any change in status.    Jerilynn Som, MS, CCC-SLP Watson,Katherine 07/02/2016, 1:43 PM

## 2016-07-02 NOTE — Evaluation (Signed)
Occupational Therapy Evaluation Patient Details Name: Sean Espinoza MRN: [id number] DOB: 14-May-1975 Today's Date: 07/02/2016    History of Present Illness Pt is a 41 y.o.malewho reports that about 2 weeks ago he noted blurry vision from his left eye. About 3-4 days ago he noted a roaring sensation in his left ear. Then at 1300 on 4/2 he noted left facial droop, some slurring of speech, left sided weakness and left sided numbness. Patient thought he may be having as stroke and presented for evaluation evening of 4/2. MRI of the brain reviewed and shows bilateral white matter lesions in a distribution consistent with MS. Three of these lesions were contrast enhancing. Although with no previous clinical symptoms, MRI suggests lesions in more than one time and space. Acute MS treatment indicated per neurology.    Clinical Impression   Pt seen for OT evaluation this date. 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 Permanent residence in [place] with spouse and 2 adolescent children. Pt's mother present for evaluation, lives in [place] but willing/able to provide 24/7 support if needed ("whatever we need") upon discharge. Pt reports family in [place] could also provide 24/7 assist if needed. Pt presents with sensory deficits in LUE, decreased strength in LUE, L facial droop/decreased sensation, blurry vision in L eye only which with additional time goes away when R eye is closed, impaired convergence and accommodation, R pupil larger than L pupil upon exam. Pt able to perform functional tasks at modif indep level, cautious with good safety awareness and no LOB noted. Pt reports dizziness that "comes and goes" with no clear pattern. Pt would benefit from additional skilled OT services on outpatient basis to address noted impairments and support safety and  functional independence at home, compensatory strategies for visual/cognitive/sensory/strength deficits and to minimize falls risk.  No additional acute care OT needs at this time. Will sign off. Please re-consult if additional acute OT needs are identified.    Follow Up Recommendations  Outpatient OT    Equipment Recommendations  None recommended by OT    Recommendations for Other Services       Precautions / Restrictions Precautions Precautions: None Restrictions Weight Bearing Restrictions: No      Mobility Bed Mobility Overal bed mobility: Modified Independent             General bed mobility comments: cautious, no LOB, good safety awareness  Transfers Overall transfer level: Modified independent               General transfer comment: cautious, no LOB, good safety awareness    Balance Overall balance assessment: No apparent balance deficits (not formally assessed);Modified Independent                                         ADL either performed or assessed with clinical judgement   ADL Overall ADL's : Modified independent                                       General ADL Comments: Pt able to perform all bed mobility, functional transfers, toileting, LB dressing tasks at modified indep level, cautious, good safety awareness  Vision Baseline Vision/History: No visual deficits Patient Visual Report: Blurring of vision (L eye blurry vision only, additional time for L eye to accommodate to near/far) Vision Assessment?: Yes Eye Alignment: Within Functional Limits Ocular Range of Motion: Within Functional Limits Alignment/Gaze Preference: Within Defined Limits Tracking/Visual Pursuits: Able to track stimulus in all quads without difficulty Saccades: Decreased speed of saccadic movement Convergence: Impaired - to be further tested in functional context Visual Fields: No apparent deficits Additional Comments: R pupil  larger than L pupil; additional time required for L eye to accommodate near/far, increased blurriness with nearsight     Perception     Praxis Praxis Praxis tested?: Within functional limits    Pertinent Vitals/Pain Pain Assessment: No/denies pain     Hand Dominance Right   Extremity/Trunk Assessment Upper Extremity Assessment Upper Extremity Assessment: Overall WFL for tasks assessed;LUE deficits/detail LUE Deficits / Details: grossly 4-/5, except tricep 3+/5; AROM WFL; decreased sensation (light touch, sharp/dull) on LUE  LUE Sensation: decreased light touch   Lower Extremity Assessment Lower Extremity Assessment: Defer to PT evaluation;Overall [facility] for tasks assessed   Cervical / Trunk Assessment Cervical / Trunk Assessment: Normal   Communication Communication Communication: No difficulties   Cognition Arousal/Alertness: Awake/alert Behavior During Therapy: WFL for tasks assessed/performed Overall Cognitive Status: Within Functional Limits for tasks assessed                                 General Comments: reports mild dizziness that "comes and goes"    General Comments       Exercises     Shoulder Instructions      Home Living Family/patient expects to be discharged to:: Private residence Living Arrangements: Spouse/significant other;Children (In [place] lives with spouse, 2 adolescent children; recently moved to [place] living alone for work assignment) Available Help at Discharge: Family;Available 24 hours/day ([facility], wife/wife's family could be there 24/7 if needed; in [place], mother could be there 24/7 initially if needed) Type of Home: Mobile home (home in [place]) Home Access: Stairs to enter [employer] of Steps: 4 Entrance Stairs-Rails: Can reach both;Left;Right Home Layout: One level     Bathroom Shower/Tub: Walk-in shower;Door   [employer] Toilet: Standard Bathroom Accessibility: Yes How Accessible: Accessible via walker Home  Equipment: None          Prior Functioning/Environment Level of Independence: Independent        Comments: Pt was indep with ADL, IADL, including caregiving for children, driving, 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       Co-evaluation              End of Session Equipment Utilized During Treatment: Gait belt  Activity Tolerance: Patient tolerated treatment well Patient left: in bed;with call bell/phone within reach;with family/visitor present  OT Visit Diagnosis: Muscle weakness (generalized) (M62.81);Hemiplegia and hemiparesis Hemiplegia - Right/Left: Left Hemiplegia - dominant/non-dominant: Non-Dominant Hemiplegia - caused by: Unspecified  Time: 1610-9604 OT Time Calculation (min): 23 min Charges:  OT General Charges $OT Visit: 1 Procedure OT Evaluation $OT Eval Low Complexity: 1 Procedure OT Treatments $Self Care/Home Management : 8-22 mins G-Codes: OT G-codes **NOT FOR INPATIENT CLASS** Functional Assessment Tool Used: AM-PAC 6 Clicks Daily Activity;Clinical judgement Functional Limitation: Self care Self Care Current Status (V4098): At least 20 percent but less than 40 percent impaired, limited or restricted Self Care Goal Status (J1914): At least 20 percent but less than 40 percent impaired, limited or restricted Self Care Discharge Status [phone]): At least 20 percent but less than 40 percent impaired, limited or restricted   Richrd Prime, MPH, MS, OTR/L ascom [phone] 07/02/16, 3:55 PM

## 2016-07-02 NOTE — Progress Notes (Signed)
OT Cancellation Note  Patient Details Name: Sean Espinoza MRN: [id number] DOB: 1976-01-26   Cancelled Treatment:    Reason Eval/Treat Not Completed: Patient at procedure or test/ unavailable. Order received, chart reviewed. Pt out of room for testing. Will re-attempt OT evaluation at later date/time as pt is available.  Richrd Prime, MPH, MS, OTR/L ascom [phone] 07/02/16, 9:21 AM

## 2016-07-02 NOTE — Consult Note (Signed)
Reason for Consult:Left sided weakness, visual changes Referring Physician: Luberta Mutter  CC: Left sided weakness, visual changes  HPI: Sean Espinoza is an 41 y.o. male who reports that about two weeks ago he noted blurry vision from his left eye.  About 3-4 days ago he noted a roaring sensation in his left ear.  Then at 1300 on yesterday he noted left facial droop, some slurring of speech, left sided weakness and left sided numbness.  Patient thought he may be having as stroke and presented for evaluation.  Initial NIHSS of 4.    Past medical history: Atrial fibrillation  History reviewed. No pertinent surgical history.  Family history: Parents alive and well.  Son alive and well.    Social History:  reports that he has never smoked. He has never used smokeless tobacco. He reports that he does not drink alcohol or use drugs.  No Known Allergies  Medications:  I have reviewed the patient's current medications. Prior to Admission:  Prescriptions Prior to Admission  Medication Sig Dispense Refill Last Dose  . lansoprazole (PREVACID) 30 MG capsule Take 20 mg by mouth daily at 12 noon.     . metoprolol (LOPRESSOR) 50 MG tablet Take 50 mg by mouth 2 (two) times daily.     . sertraline (ZOLOFT) 100 MG tablet Take 100 mg by mouth daily.      Scheduled: . aspirin  300 mg Rectal Daily   Or  . aspirin  325 mg Oral Daily  . atorvastatin  40 mg Oral q1800  . enoxaparin (LOVENOX) injection  40 mg Subcutaneous Q24H  . methylPREDNISolone (SOLU-MEDROL) injection  1,000 mg Intravenous Daily  . metoprolol  50 mg Oral BID  . pantoprazole  40 mg Oral Daily  . sertraline  100 mg Oral Daily    ROS: History obtained from the patient  General ROS: negative for - chills, fatigue, fever, night sweats, weight gain or weight loss Psychological ROS: negative for - behavioral disorder, hallucinations, memory difficulties, mood swings or suicidal ideation Ophthalmic ROS: as noted in HPI ENT ROS:  negative for - epistaxis, nasal discharge, oral lesions, sore throat, tinnitus or vertigo Allergy and Immunology ROS: negative for - hives or itchy/watery eyes Hematological and Lymphatic ROS: negative for - bleeding problems, bruising or swollen lymph nodes Endocrine ROS: negative for - galactorrhea, hair pattern changes, polydipsia/polyuria or temperature intolerance Respiratory ROS: negative for - cough, hemoptysis, shortness of breath or wheezing Cardiovascular ROS: negative for - chest pain, dyspnea on exertion, edema or irregular heartbeat Gastrointestinal ROS: negative for - abdominal pain, diarrhea, hematemesis, nausea/vomiting or stool incontinence Genito-Urinary ROS: negative for - dysuria, hematuria, incontinence or urinary frequency/urgency Musculoskeletal ROS: negative for - joint swelling or muscular weakness Neurological ROS: as noted in HPI Dermatological ROS: negative for rash and skin lesion changes  Physical Examination: Blood pressure 124/76, pulse 63, temperature 98 F (36.7 C), temperature source Oral, resp. rate 20, height 6\' 1"  (1.854 m), weight 132.9 kg (293 lb), SpO2 96 %.  HEENT-  Normocephalic, no lesions, without obvious abnormality.  Normal external eye and conjunctiva.  Normal TM's bilaterally.  Normal auditory canals and external ears. Normal external nose, mucus membranes and septum.  Normal pharynx. Cardiovascular- S1, S2 normal, pulses palpable throughout   Lungs- chest clear, no wheezing, rales, normal symmetric air entry Abdomen- soft, non-tender; bowel sounds normal; no masses,  no organomegaly Extremities- no edema Lymph-no adenopathy palpable Musculoskeletal-no joint tenderness, deformity or swelling Skin-warm and dry, no hyperpigmentation, vitiligo, or  suspicious lesions  Neurological Examination   Mental Status: Alert, oriented, thought content appropriate.  Speech fluent without evidence of aphasia.  Able to follow 3 step commands without  difficulty. Cranial Nerves: II: Discs difficult to visualize; Visual fields grossly normal, pupils equal, round, reactive to light and accommodation III,IV, VI: ptosis not present, extra-ocular motions intact bilaterally V,VII: left facial droop, facial light touch sensation decreased on the left VIII: hearing normal bilaterally IX,X: gag reflex present XI: bilateral shoulder shrug XII: midline tongue extension Motor: Right : Upper extremity   5/5    Left:     Upper extremity   5-/5  Lower extremity   5/5     Lower extremity   5-/5 Tone and bulk:normal tone throughout; no atrophy noted Sensory: Pinprick and light touch decreased in the LUE Deep Tendon Reflexes: 2+ and symmetric throughout Plantars: Right: downgoing   Left: downgoing Cerebellar: Normal finger-to-nose and normal heel-to-shin testing bilaterally Gait: not tested due to safety concerns   Laboratory Studies:   Basic Metabolic Panel:  Recent Labs Lab 07/01/16 2010  NA 140  K 3.5  CL 104  CO2 27  GLUCOSE 102*  BUN 15  CREATININE 1.11  CALCIUM 9.3    Liver Function Tests:  Recent Labs Lab 07/01/16 2010  AST 20  ALT 15*  ALKPHOS 48  BILITOT 0.7  PROT 7.8  ALBUMIN 4.8   No results for input(s): LIPASE, AMYLASE in the last 168 hours. No results for input(s): AMMONIA in the last 168 hours.  CBC:  Recent Labs Lab 07/01/16 2010  WBC 10.8*  NEUTROABS 5.5  HGB 15.7  HCT 47.1  MCV 87.9  PLT 267    Cardiac Enzymes:  Recent Labs Lab 07/01/16 2010  TROPONINI <0.03    BNP: Invalid input(s): POCBNP  CBG:  Recent Labs Lab 07/01/16 2045  GLUCAP 107*    Microbiology: No results found for this or any previous visit.  Coagulation Studies:  Recent Labs  07/01/16 2010  LABPROT 12.7  INR 0.95    Urinalysis:  Recent Labs Lab 07/01/16 2020  COLORURINE YELLOW*  LABSPEC 1.021  PHURINE 5.0  GLUCOSEU NEGATIVE  HGBUR NEGATIVE  BILIRUBINUR NEGATIVE  KETONESUR NEGATIVE  PROTEINUR  NEGATIVE  NITRITE NEGATIVE  LEUKOCYTESUR NEGATIVE    Lipid Panel:     Component Value Date/Time   CHOL 197 07/02/2016 0353   TRIG 206 (H) 07/02/2016 0353   HDL 29 (L) 07/02/2016 0353   CHOLHDL 6.8 07/02/2016 0353   VLDL 41 (H) 07/02/2016 0353   LDLCALC 127 (H) 07/02/2016 0353    HgbA1C: No results found for: HGBA1C  Urine Drug Screen:     Component Value Date/Time   LABOPIA NONE DETECTED 07/01/2016 2020   COCAINSCRNUR NONE DETECTED 07/01/2016 2020   LABBENZ NONE DETECTED 07/01/2016 2020   AMPHETMU NONE DETECTED 07/01/2016 2020   THCU NONE DETECTED 07/01/2016 2020   LABBARB NONE DETECTED 07/01/2016 2020    Alcohol Level:  Recent Labs Lab 07/01/16 2020  ETH <5    Other results: EKG: sinus rhythm at 76 bpm.  Imaging: Dg Chest 2 View  Result Date: 07/02/2016 CLINICAL DATA:  Shortness of breath.  Weakness. EXAM: CHEST  2 VIEW COMPARISON:  No prior exams . FINDINGS: Mediastinum and hilar structures normal. Low lung volumes with mild bibasilar atelectasis. No pleural effusion or pneumothorax. Heart size normal. Cervicothoracic spine fusion. IMPRESSION: Low lung volumes with mild bibasilar atelectasis . Electronically Signed   By: Maisie Fus  Register   On:  07/02/2016 10:02   Mr Laqueta Jean BM Contrast  Result Date: 07/02/2016 CLINICAL DATA:  Blurred vision on the left over the last 2 weeks. Headache. Atrial fibrillation. Left-sided facial droop. EXAM: MRI HEAD WITHOUT AND WITH CONTRAST TECHNIQUE: Multiplanar, multiecho pulse sequences of the brain and surrounding structures were obtained without and with intravenous contrast. CONTRAST:  20mL MULTIHANCE GADOBENATE DIMEGLUMINE 529 MG/ML IV SOLN COMPARISON:  CT 07/01/2016 FINDINGS: Brain: Brainstem and cerebellum are normal. Within the cerebral hemispheres, there are numerous scattered foci of T2 and FLAIR signal primarily within the deep white matter but with some involvement of the subcortical white matter. Some of these show low level  restricted diffusion. Findings are most consistent with demyelinating disease/ multiple sclerosis. The possibility of small vessel infarctions is considerably less likely given the pattern. After contrast administration, enhancement occurs within 80 right posterior frontal subcortical white matter focus. There is also enhancement of a a left posterior parietal subcortical white matter focus. Small focus of enhancement seen within the right side of the corpus callosum. Vascular: Major vessels at the base of the brain show flow. Skull and upper cervical spine: Negative Sinuses/Orbits: Clear/normal Other: None significant IMPRESSION: Multifocal white matter lesions most consistent with multiple sclerosis/ demyelinating disease. Deep and subcortical white matter involvement. Scattered areas of low level restricted diffusion. Contrast enhancement of 3 foci, in the right posterior frontal subcortical white matter, the right corpus callosum, in the left parietal subcortical white matter. Electronically Signed   By: Paulina Fusi M.D.   On: 07/02/2016 10:03   US Carotid Bilateral (at [facility] And [facility] Only)  Result Date: 07/02/2016 CLINICAL DATA:  TIA. EXAM: BILATERAL CAROTID DUPLEX ULTRASOUND TECHNIQUE: Wallace Cullens scale imaging, color Doppler and duplex ultrasound were performed of bilateral carotid and vertebral arteries in the neck. COMPARISON:  MRI 07/02/2016 . FINDINGS: Criteria: Quantification of carotid stenosis is based on velocity parameters that correlate the residual internal carotid diameter with NASCET-based stenosis levels, using the diameter of the distal internal carotid lumen as the denominator for stenosis measurement. The following velocity measurements were obtained: RIGHT ICA:  59/26 cm/sec CCA:  74/19 cm/sec SYSTOLIC ICA/CCA RATIO:  0.8 DIASTOLIC ICA/CCA RATIO:  1.3 ECA:  75 cm/sec LEFT ICA:  58/24 cm/sec CCA:  89/20 cm/sec SYSTOLIC ICA/CCA RATIO:  0.7 DIASTOLIC ICA/CCA RATIO:  1.2 ECA:  74 cm/sec RIGHT CAROTID  ARTERY: No significant carotid atherosclerotic vascular disease. No flow limiting stenosis. RIGHT VERTEBRAL ARTERY:  Patent with antegrade flow. LEFT CAROTID ARTERY: No significant carotid atherosclerotic vascular disease. No flow limiting stenosis . LEFT VERTEBRAL ARTERY:  Patent with antegrade flow. IMPRESSION: 1. No significant carotid atherosclerotic vascular disease. No flow limiting stenosis. 2. Vertebrals are patent antegrade flow . Electronically Signed   By: Maisie Fus  Register   On: 07/02/2016 11:22   Ct Head Code Stroke W/o Cm  Addendum Date: 07/01/2016   ADDENDUM REPORT: 07/01/2016 20:18 ADDENDUM: These results were called by telephone at the time of interpretation on 07/01/2016 at 8:18 pm to Dr. Jene Every , who verbally acknowledged these results. Electronically Signed   By: Deatra Robinson M.D.   On: 07/01/2016 20:18   Result Date: 07/01/2016 CLINICAL DATA:  Code stroke.  Left-sided facial droop and weakness. EXAM: CT HEAD WITHOUT CONTRAST TECHNIQUE: Contiguous axial images were obtained from the base of the skull through the vertex without intravenous contrast. COMPARISON:  None. FINDINGS: Brain: No mass lesion, intraparenchymal hemorrhage or extra-axial collection. No evidence of acute cortical infarct. Brain parenchyma and CSF-containing spaces are  normal for age. Vascular: No hyperdense vessel or unexpected calcification. Skull: Normal visualized skull base, calvarium and extracranial soft tissues. Sinuses/Orbits: No sinus fluid levels or advanced mucosal thickening. No mastoid effusion. Normal orbits. ASPECTS [facility] Stroke Program Early CT Score) - Ganglionic level infarction (caudate, lentiform nuclei, internal capsule, insula, M1-M3 cortex): 7 - Supraganglionic infarction (M4-M6 cortex): 3 Total score (0-10 with 10 being normal): 10 IMPRESSION: 1. Normal head CT. 2. ASPECTS is 10. I attempted to contact Dr. Jene Every at 8:02 p.m. and am currently awaiting a return phone call.  Electronically Signed: By: Deatra Robinson M.D. On: 07/01/2016 20:06     Assessment/Plan: 41 year old male presenting with a tow week history of decreased vision and a more recent history of left sided numbness and weakness.  MRI of the brain reviewed and shows bilateral white matter lesions in a distribution consistent with MS.  Three of these lesions were contrast enhancing.  Although with no previous clinical symptoms, MRI suggests lesions in more than one time and space.  Acute MS treatment indicated.    Recommendations: 1.  Solumedrol 1000mg  IV daily for 3 days 2.  Lyme titer 3.  Ophthalmology consult  4.  Patient to follow up with neurology as an outpatient.  Would like to see the MS specialist in [facility] with [facility]- Dr. Epimenio Foot.  5.  Agree with PT and OT consults  Thana Farr, MD Neurology [phone] 07/02/2016, 2:30 PM

## 2016-07-02 NOTE — Progress Notes (Signed)
PT Cancellation Note  Patient Details Name: ZADIEL BREDAHL MRN: [id number] DOB: June 06, 1975   Cancelled Treatment:    Reason Eval/Treat Not Completed: Patient at procedure or test/unavailable (Consult received and chart reviewed.  Patient currently off unit for diagnostic testing.  Will re-attempt at later time/date as patient available and medically appropriate.)   Nyanna Heideman H. Manson Passey, PT, DPT, NCS 07/02/16, 11:03 AM [phone]

## 2016-07-02 NOTE — Progress Notes (Signed)
[facility] [place] [facility]   PATIENT NAME: Sean Espinoza    MR#:  [id number]  DATE OF BIRTH:  1975-11-16  SUBJECTIVE: Seen at the bedside, admitted for blurred vision on the left eye, left hand weakness and possible TIA versus multiple sclerosis. Patient MRI of the brain shows demyelinated plaques concerning for multiple sclerosis. Patient still has weakness in the left arm, blurred vision in the left eye without any other neurological changes. No weakness in the left leg.   CHIEF COMPLAINT:   Chief Complaint  Patient presents with  . Code Stroke    REVIEW OF SYSTEMS:    Review of Systems  Constitutional: Negative for chills and fever.  HENT: Negative for hearing loss.   Eyes: Positive for blurred vision. Negative for double vision and photophobia.  Respiratory: Negative for cough, hemoptysis and shortness of breath.   Cardiovascular: Negative for palpitations, orthopnea and leg swelling.  Gastrointestinal: Negative for abdominal pain, diarrhea and vomiting.  Genitourinary: Negative for dysuria and urgency.  Musculoskeletal: Negative for myalgias and neck pain.  Skin: Negative for rash.  Neurological: Positive for focal weakness. Negative for dizziness, seizures, weakness and headaches.  Psychiatric/Behavioral: Negative for memory loss. The patient does not have insomnia.     Nutrition: Tolerating Diet: Tolerating PT:      DRUG ALLERGIES:  No Known Allergies  VITALS:  Blood pressure (!) 134/99, pulse 68, temperature 98.4 F (36.9 C), temperature source Oral, resp. rate 20, height 6\' 1"  (1.854 m), weight 132.9 kg (293 lb), SpO2 97 %.  PHYSICAL EXAMINATION:   Physical Exam  GENERAL:  41 y.o.-year-old patient lying in the bed with no acute distress.  EYES: Pupils equal, round, reactive to light and accommodation. No scleral icterus. Extraocular muscles intact.  HEENT: Head atraumatic, normocephalic. Oropharynx and nasopharynx clear.   NECK:  Supple, no jugular venous distention. No thyroid enlargement, no tenderness.  LUNGS: Normal breath sounds bilaterally, no wheezing, rales,rhonchi or crepitation. No use of accessory muscles of respiration.  CARDIOVASCULAR: S1, S2 normal. No murmurs, rubs, or gallops.  ABDOMEN: Soft, nontender, nondistended. Bowel sounds present. No organomegaly or mass.  EXTREMITIES: No pedal edema, cyanosis, or clubbing.  NEUROLOGIC: Cranial nerves II through XII are intact. Muscle strength 3/5  On LUEextremity. Strength 5 /5 and left lower extremities, right upper and lower extremity.  /PSYCHIATRIC: The patient is alert and oriented x 3.  SKIN: No obvious rash, lesion, or ulcer.    LABORATORY PANEL:   CBC  Recent Labs Lab 07/01/16 2010  WBC 10.8*  HGB 15.7  HCT 47.1  PLT 267   ------------------------------------------------------------------------------------------------------------------  Chemistries   Recent Labs Lab 07/01/16 2010  NA 140  K 3.5  CL 104  CO2 27  GLUCOSE 102*  BUN 15  CREATININE 1.11  CALCIUM 9.3  AST 20  ALT 15*  ALKPHOS 48  BILITOT 0.7   ------------------------------------------------------------------------------------------------------------------  Cardiac Enzymes  Recent Labs Lab 07/01/16 2010  TROPONINI <0.03   ------------------------------------------------------------------------------------------------------------------  RADIOLOGY:  Dg Chest 2 View  Result Date: 07/02/2016 CLINICAL DATA:  Shortness of breath.  Weakness. EXAM: CHEST  2 VIEW COMPARISON:  No prior exams . FINDINGS: Mediastinum and hilar structures normal. Low lung volumes with mild bibasilar atelectasis. No pleural effusion or pneumothorax. Heart size normal. Cervicothoracic spine fusion. IMPRESSION: Low lung volumes with mild bibasilar atelectasis . Electronically Signed   By: Maisie Fus  Register   On: 07/02/2016 10:02   Mr Brain W Wo Contrast  Result Date:  07/02/2016 CLINICAL DATA:  Blurred vision on the left over the last 2 weeks. Headache. Atrial fibrillation. Left-sided facial droop. EXAM: MRI HEAD WITHOUT AND WITH CONTRAST TECHNIQUE: Multiplanar, multiecho pulse sequences of the brain and surrounding structures were obtained without and with intravenous contrast. CONTRAST:  20mL MULTIHANCE GADOBENATE DIMEGLUMINE 529 MG/ML IV SOLN COMPARISON:  CT 07/01/2016 FINDINGS: Brain: Brainstem and cerebellum are normal. Within the cerebral hemispheres, there are numerous scattered foci of T2 and FLAIR signal primarily within the deep white matter but with some involvement of the subcortical white matter. Some of these show low level restricted diffusion. Findings are most consistent with demyelinating disease/ multiple sclerosis. The possibility of small vessel infarctions is considerably less likely given the pattern. After contrast administration, enhancement occurs within 80 right posterior frontal subcortical white matter focus. There is also enhancement of a a left posterior parietal subcortical white matter focus. Small focus of enhancement seen within the right side of the corpus callosum. Vascular: Major vessels at the base of the brain show flow. Skull and upper cervical spine: Negative Sinuses/Orbits: Clear/normal Other: None significant IMPRESSION: Multifocal white matter lesions most consistent with multiple sclerosis/ demyelinating disease. Deep and subcortical white matter involvement. Scattered areas of low level restricted diffusion. Contrast enhancement of 3 foci, in the right posterior frontal subcortical white matter, the right corpus callosum, in the left parietal subcortical white matter. Electronically Signed   By: Paulina Fusi M.D.   On: 07/02/2016 10:03   US Carotid Bilateral (at [facility] And [facility] Only)  Result Date: 07/02/2016 CLINICAL DATA:  TIA. EXAM: BILATERAL CAROTID DUPLEX ULTRASOUND TECHNIQUE: Wallace Cullens scale imaging, color Doppler and duplex ultrasound  were performed of bilateral carotid and vertebral arteries in the neck. COMPARISON:  MRI 07/02/2016 . FINDINGS: Criteria: Quantification of carotid stenosis is based on velocity parameters that correlate the residual internal carotid diameter with NASCET-based stenosis levels, using the diameter of the distal internal carotid lumen as the denominator for stenosis measurement. The following velocity measurements were obtained: RIGHT ICA:  59/26 cm/sec CCA:  74/19 cm/sec SYSTOLIC ICA/CCA RATIO:  0.8 DIASTOLIC ICA/CCA RATIO:  1.3 ECA:  75 cm/sec LEFT ICA:  58/24 cm/sec CCA:  89/20 cm/sec SYSTOLIC ICA/CCA RATIO:  0.7 DIASTOLIC ICA/CCA RATIO:  1.2 ECA:  74 cm/sec RIGHT CAROTID ARTERY: No significant carotid atherosclerotic vascular disease. No flow limiting stenosis. RIGHT VERTEBRAL ARTERY:  Patent with antegrade flow. LEFT CAROTID ARTERY: No significant carotid atherosclerotic vascular disease. No flow limiting stenosis . LEFT VERTEBRAL ARTERY:  Patent with antegrade flow. IMPRESSION: 1. No significant carotid atherosclerotic vascular disease. No flow limiting stenosis. 2. Vertebrals are patent antegrade flow . Electronically Signed   By: Maisie Fus  Register   On: 07/02/2016 11:22   Ct Head Code Stroke W/o Cm  Addendum Date: 07/01/2016   ADDENDUM REPORT: 07/01/2016 20:18 ADDENDUM: These results were called by telephone at the time of interpretation on 07/01/2016 at 8:18 pm to Dr. Jene Every , who verbally acknowledged these results. Electronically Signed   By: Deatra Robinson M.D.   On: 07/01/2016 20:18   Result Date: 07/01/2016 CLINICAL DATA:  Code stroke.  Left-sided facial droop and weakness. EXAM: CT HEAD WITHOUT CONTRAST TECHNIQUE: Contiguous axial images were obtained from the base of the skull through the vertex without intravenous contrast. COMPARISON:  None. FINDINGS: Brain: No mass lesion, intraparenchymal hemorrhage or extra-axial collection. No evidence of acute cortical infarct. Brain parenchyma and  CSF-containing spaces are normal for age. Vascular: No hyperdense vessel or unexpected calcification. Skull: Normal  visualized skull base, calvarium and extracranial soft tissues. Sinuses/Orbits: No sinus fluid levels or advanced mucosal thickening. No mastoid effusion. Normal orbits. ASPECTS [facility] Stroke Program Early CT Score) - Ganglionic level infarction (caudate, lentiform nuclei, internal capsule, insula, M1-M3 cortex): 7 - Supraganglionic infarction (M4-M6 cortex): 3 Total score (0-10 with 10 being normal): 10 IMPRESSION: 1. Normal head CT. 2. ASPECTS is 10. I attempted to contact Dr. Jene Every at 8:02 p.m. and am currently awaiting a return phone call. Electronically Signed: By: Deatra Robinson M.D. On: 07/01/2016 20:06     ASSESSMENT AND PLAN:   Active Problems:   TIA (transient ischemic attack)   Weakness   #1. left eye blurred vision, weakness of the left hand; the brain concerning for multiple sclerosis: Explained the findings to patient, waiting for neurology input, may need MRI of the cervical spine and , IV steroids. #2 proximal atrial fibrillation: Controlled, continue beta blockers #3. Hyperlipidemia: Tinea atorvastatin 40 mg by mouth daily. #4 GERD continue PPIs. #5 depression: Continue Zoloft 100 mg by mouth daily.    All the records are reviewed and case discussed with Care Management/Social Workerr. Management plans discussed with the patient, family and they are in agreement.  CODE STATUS: full  TOTAL TIME TAKING CARE OF THIS PATIENT:35 minutes.   POSSIBLE D/C IN 1-2DAYS, DEPENDING ON CLINICAL CONDITION.   Katha Hamming M.D on 07/02/2016 at 12:45 PM  Between 7am to 6pm - Pager - [phone]  After 6pm go to [email or web address] - password [email or web address] [facility]  [facility]  Office  [phone]  CC: Primary care physician; No PCP Per Patient

## 2016-07-03 DIAGNOSIS — R29704 NIHSS score 4: Secondary | ICD-10-CM | POA: Diagnosis present

## 2016-07-03 DIAGNOSIS — Z79899 Other long term (current) drug therapy: Secondary | ICD-10-CM | POA: Diagnosis not present

## 2016-07-03 DIAGNOSIS — F329 Major depressive disorder, single episode, unspecified: Secondary | ICD-10-CM | POA: Diagnosis present

## 2016-07-03 DIAGNOSIS — G459 Transient cerebral ischemic attack, unspecified: Secondary | ICD-10-CM | POA: Diagnosis present

## 2016-07-03 DIAGNOSIS — H538 Other visual disturbances: Secondary | ICD-10-CM

## 2016-07-03 DIAGNOSIS — G8194 Hemiplegia, unspecified affecting left nondominant side: Secondary | ICD-10-CM | POA: Diagnosis present

## 2016-07-03 DIAGNOSIS — Z981 Arthrodesis status: Secondary | ICD-10-CM | POA: Diagnosis not present

## 2016-07-03 DIAGNOSIS — I4891 Unspecified atrial fibrillation: Secondary | ICD-10-CM | POA: Diagnosis present

## 2016-07-03 DIAGNOSIS — I10 Essential (primary) hypertension: Secondary | ICD-10-CM | POA: Diagnosis present

## 2016-07-03 DIAGNOSIS — K219 Gastro-esophageal reflux disease without esophagitis: Secondary | ICD-10-CM | POA: Diagnosis present

## 2016-07-03 DIAGNOSIS — Z823 Family history of stroke: Secondary | ICD-10-CM | POA: Diagnosis not present

## 2016-07-03 DIAGNOSIS — H547 Unspecified visual loss: Secondary | ICD-10-CM | POA: Diagnosis present

## 2016-07-03 DIAGNOSIS — G35 Multiple sclerosis: Secondary | ICD-10-CM | POA: Diagnosis present

## 2016-07-03 DIAGNOSIS — Z8249 Family history of ischemic heart disease and other diseases of the circulatory system: Secondary | ICD-10-CM | POA: Diagnosis not present

## 2016-07-03 DIAGNOSIS — E785 Hyperlipidemia, unspecified: Secondary | ICD-10-CM | POA: Diagnosis present

## 2016-07-03 DIAGNOSIS — R29810 Facial weakness: Secondary | ICD-10-CM | POA: Diagnosis present

## 2016-07-03 DIAGNOSIS — H02209 Unspecified lagophthalmos unspecified eye, unspecified eyelid: Secondary | ICD-10-CM | POA: Diagnosis present

## 2016-07-03 LAB — HEMOGLOBIN A1C
HEMOGLOBIN A1C: 5.1 % (ref 4.8–5.6)
MEAN PLASMA GLUCOSE: 100 mg/dL

## 2016-07-03 LAB — HIV ANTIBODY (ROUTINE TESTING W REFLEX): HIV SCREEN 4TH GENERATION: NONREACTIVE

## 2016-07-03 LAB — B. BURGDORFI ANTIBODIES

## 2016-07-03 MED ORDER — POLYVINYL ALCOHOL 1.4 % OP SOLN
2.0000 [drp] | Freq: Three times a day (TID) | OPHTHALMIC | Status: DC
  Administered 2016-07-03 – 2016-07-04 (×2): 2 [drp] via OPHTHALMIC
  Filled 2016-07-03: qty 15

## 2016-07-03 NOTE — Progress Notes (Signed)
[facility] - [place] at [facility]   PATIENT NAME: Sean Espinoza    MR#:  [id number]  DATE OF BIRTH:  1975-12-01  SUBJECTIVE: Dr. on high-dose steroids for MS flare. His left hand weakness, left eye blurred  Vision  slightly better but not completely  Resolved.  CHIEF COMPLAINT:   Chief Complaint  Patient presents with  . Code Stroke    REVIEW OF SYSTEMS:    Review of Systems  Constitutional: Negative for chills and fever.  HENT: Negative for hearing loss.   Eyes: Positive for blurred vision. Negative for double vision and photophobia.  Respiratory: Negative for cough, hemoptysis and shortness of breath.   Cardiovascular: Negative for palpitations, orthopnea and leg swelling.  Gastrointestinal: Negative for abdominal pain, diarrhea and vomiting.  Genitourinary: Negative for dysuria and urgency.  Musculoskeletal: Negative for myalgias and neck pain.  Skin: Negative for rash.  Neurological: Positive for focal weakness. Negative for dizziness, seizures, weakness and headaches.  Psychiatric/Behavioral: Negative for memory loss. The patient does not have insomnia.     Nutrition: Tolerating Diet: Tolerating PT:      DRUG ALLERGIES:  No Known Allergies  VITALS:  Blood pressure (!) 142/85, pulse 82, temperature 98 F (36.7 C), temperature source Oral, resp. rate 20, height 6\' 1"  (1.854 m), weight 132.9 kg (293 lb), SpO2 97 %.  PHYSICAL EXAMINATION:   Physical Exam  GENERAL:  41 y.o.-year-old patient lying in the bed with no acute distress.  EYES: Pupils equal, round, reactive to light and accommodation. No scleral icterus. Extraocular muscles intact.  HEENT: Head atraumatic, normocephalic. Oropharynx and nasopharynx clear.  NECK:  Supple, no jugular venous distention. No thyroid enlargement, no tenderness.  LUNGS: Normal breath sounds bilaterally, no wheezing, rales,rhonchi or crepitation. No use of accessory muscles of respiration.   CARDIOVASCULAR: S1, S2 normal. No murmurs, rubs, or gallops.  ABDOMEN: Soft, nontender, nondistended. Bowel sounds present. No organomegaly or mass.  EXTREMITIES: No pedal edema, cyanosis, or clubbing.  NEUROLOGIC: Cranial nerves II through XII are intact. Muscle strength 3/5  On LUEextremity. Strength 5 /5 and left lower extremities, right upper and lower extremity.  /PSYCHIATRIC: The patient is alert and oriented x 3.  SKIN: No obvious rash, lesion, or ulcer.    LABORATORY PANEL:   CBC  Recent Labs Lab 07/01/16 2010  WBC 10.8*  HGB 15.7  HCT 47.1  PLT 267   ------------------------------------------------------------------------------------------------------------------  Chemistries   Recent Labs Lab 07/01/16 2010  NA 140  K 3.5  CL 104  CO2 27  GLUCOSE 102*  BUN 15  CREATININE 1.11  CALCIUM 9.3  AST 20  ALT 15*  ALKPHOS 48  BILITOT 0.7   ------------------------------------------------------------------------------------------------------------------  Cardiac Enzymes  Recent Labs Lab 07/01/16 2010  TROPONINI <0.03   ------------------------------------------------------------------------------------------------------------------  RADIOLOGY:  Dg Chest 2 View  Result Date: 07/02/2016 CLINICAL DATA:  Shortness of breath.  Weakness. EXAM: CHEST  2 VIEW COMPARISON:  No prior exams . FINDINGS: Mediastinum and hilar structures normal. Low lung volumes with mild bibasilar atelectasis. No pleural effusion or pneumothorax. Heart size normal. Cervicothoracic spine fusion. IMPRESSION: Low lung volumes with mild bibasilar atelectasis . Electronically Signed   By: Maisie Fus  Register   On: 07/02/2016 10:02   Mr Brain W Wo Contrast  Result Date: 07/02/2016 CLINICAL DATA:  Blurred vision on the left over the last 2 weeks. Headache. Atrial fibrillation. Left-sided facial droop. EXAM: MRI HEAD WITHOUT AND WITH CONTRAST TECHNIQUE: Multiplanar, multiecho pulse sequences of the  brain  and surrounding structures were obtained without and with intravenous contrast. CONTRAST:  20mL MULTIHANCE GADOBENATE DIMEGLUMINE 529 MG/ML IV SOLN COMPARISON:  CT 07/01/2016 FINDINGS: Brain: Brainstem and cerebellum are normal. Within the cerebral hemispheres, there are numerous scattered foci of T2 and FLAIR signal primarily within the deep white matter but with some involvement of the subcortical white matter. Some of these show low level restricted diffusion. Findings are most consistent with demyelinating disease/ multiple sclerosis. The possibility of small vessel infarctions is considerably less likely given the pattern. After contrast administration, enhancement occurs within 80 right posterior frontal subcortical white matter focus. There is also enhancement of a a left posterior parietal subcortical white matter focus. Small focus of enhancement seen within the right side of the corpus callosum. Vascular: Major vessels at the base of the brain show flow. Skull and upper cervical spine: Negative Sinuses/Orbits: Clear/normal Other: None significant IMPRESSION: Multifocal white matter lesions most consistent with multiple sclerosis/ demyelinating disease. Deep and subcortical white matter involvement. Scattered areas of low level restricted diffusion. Contrast enhancement of 3 foci, in the right posterior frontal subcortical white matter, the right corpus callosum, in the left parietal subcortical white matter. Electronically Signed   By: Paulina Fusi M.D.   On: 07/02/2016 10:03   US Carotid Bilateral (at [facility] Only)  Result Date: 07/02/2016 CLINICAL DATA:  TIA. EXAM: BILATERAL CAROTID DUPLEX ULTRASOUND TECHNIQUE: Wallace Cullens scale imaging, color Doppler and duplex ultrasound were performed of bilateral carotid and vertebral arteries in the neck. COMPARISON:  MRI 07/02/2016 . FINDINGS: Criteria: Quantification of carotid stenosis is based on velocity parameters that correlate the residual internal  carotid diameter with NASCET-based stenosis levels, using the diameter of the distal internal carotid lumen as the denominator for stenosis measurement. The following velocity measurements were obtained: RIGHT ICA:  59/26 cm/sec CCA:  74/19 cm/sec SYSTOLIC ICA/CCA RATIO:  0.8 DIASTOLIC ICA/CCA RATIO:  1.3 ECA:  75 cm/sec LEFT ICA:  58/24 cm/sec CCA:  89/20 cm/sec SYSTOLIC ICA/CCA RATIO:  0.7 DIASTOLIC ICA/CCA RATIO:  1.2 ECA:  74 cm/sec RIGHT CAROTID ARTERY: No significant carotid atherosclerotic vascular disease. No flow limiting stenosis. RIGHT VERTEBRAL ARTERY:  Patent with antegrade flow. LEFT CAROTID ARTERY: No significant carotid atherosclerotic vascular disease. No flow limiting stenosis . LEFT VERTEBRAL ARTERY:  Patent with antegrade flow. IMPRESSION: 1. No significant carotid atherosclerotic vascular disease. No flow limiting stenosis. 2. Vertebrals are patent antegrade flow . Electronically Signed   By: Maisie Fus  Register   On: 07/02/2016 11:22   Ct Head Code Stroke W/o Cm  Addendum Date: 07/01/2016   ADDENDUM REPORT: 07/01/2016 20:18 ADDENDUM: These results were called by telephone at the time of interpretation on 07/01/2016 at 8:18 pm to Dr. Jene Every , who verbally acknowledged these results. Electronically Signed   By: Deatra Robinson M.D.   On: 07/01/2016 20:18   Result Date: 07/01/2016 CLINICAL DATA:  Code stroke.  Left-sided facial droop and weakness. EXAM: CT HEAD WITHOUT CONTRAST TECHNIQUE: Contiguous axial images were obtained from the base of the skull through the vertex without intravenous contrast. COMPARISON:  None. FINDINGS: Brain: No mass lesion, intraparenchymal hemorrhage or extra-axial collection. No evidence of acute cortical infarct. Brain parenchyma and CSF-containing spaces are normal for age. Vascular: No hyperdense vessel or unexpected calcification. Skull: Normal visualized skull base, calvarium and extracranial soft tissues. Sinuses/Orbits: No sinus fluid levels or advanced  mucosal thickening. No mastoid effusion. Normal orbits. ASPECTS [facility] Stroke Program Early CT Score) - Ganglionic level infarction (caudate, lentiform  nuclei, internal capsule, insula, M1-M3 cortex): 7 - Supraganglionic infarction (M4-M6 cortex): 3 Total score (0-10 with 10 being normal): 10 IMPRESSION: 1. Normal head CT. 2. ASPECTS is 10. I attempted to contact Dr. Jene Every at 8:02 p.m. and am currently awaiting a return phone call. Electronically Signed: By: Deatra Robinson M.D. On: 07/01/2016 20:06     ASSESSMENT AND PLAN:   Active Problems:   TIA (transient ischemic attack)   Weakness   #1. left eye blurred vision, weakness of the left hand; MS flare: Newly diagnosed. Continue IV steroids 1 g daily for 3 days, ophthalmology consult for optic neuritis. Patient needs follow-up with MS specialist in [place]. PTtherapy did not recommend any needs, occupational and  recommended outpatient OT  #2 proximal atrial fibrillation: Controlled, continue beta blockers #3. Hyperlipidemia: Tinea atorvastatin 40 mg by mouth daily. #4 GERD continue PPIs. #5 depression: Continue Zoloft 100 mg by mouth daily.    All the records are reviewed and case discussed with Care Management/Social Workerr. Management plans discussed with the patient, family and they are in agreement.  CODE STATUS: full  TOTAL TIME TAKING CARE OF THIS PATIENT:35 minutes.   POSSIBLE D/C IN 1-2DAYS, DEPENDING ON CLINICAL CONDITION.   Katha Hamming M.D on 07/03/2016 at 10:14 AM  Between 7am to 6pm - Pager - [phone]  After 6pm go to [email or web address] - password [email or web address] [facility]  [place] [facility]  Office  [phone]  CC: Primary care physician; No PCP Per Patient

## 2016-07-03 NOTE — H&P (Signed)
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  Chief complaint: blurred vision left eye HPI: Pt states that he developed blurred vision of the left eye about 2 weeks ago.  He was admitted via the ED 2 days ago for stroke vs. Multiple sclerosis (given MRI with demyelinating plaques visible) when presenting with symptoms of left facial droop and left sided numbness and weakness.  He has never had such an episode in the past.   History reviewed. No pertinent past medical history.  Healthy per patient.   Review of Systems  Constitutional: Negative for chills and fever.  HENT: Negative for hearing loss.   Eyes: Positive for blurred vision. Negative for double vision, photophobia, pain, discharge and redness.  Respiratory: Negative for cough and hemoptysis.   Cardiovascular: Negative for chest pain and palpitations.  Gastrointestinal: Negative for heartburn, nausea and vomiting.  Genitourinary: Negative for dysuria.  Skin: Negative for itching and rash.  Neurological: Negative for dizziness and headaches.    History reviewed. No pertinent surgical history.  History reviewed. No pertinent family history.  Social History:  reports that he has never smoked. He has never used smokeless tobacco. He reports that he does not drink alcohol or use drugs.  Is moving to this area from [place] with his wife and kids later this year.  Allergies: No Known Allergies  Medications: I have reviewed the patient's current medications.  Results for orders placed or performed during the hospital encounter of 07/01/16 (from the past 48 hour(s))  Protime-INR     Status: None   Collection Time: 07/01/16  8:10 PM  Result Value Ref Range   Prothrombin Time 12.7 11.4 - 15.2 seconds   INR 0.95   APTT     Status: None   Collection Time: 07/01/16  8:10 PM  Result Value Ref Range   aPTT 30 24 - 36 seconds  CBC     Status: Abnormal    Collection Time: 07/01/16  8:10 PM  Result Value Ref Range   WBC 10.8 (H) 3.8 - 10.6 K/uL   RBC 5.35 4.40 - 5.90 MIL/uL   Hemoglobin 15.7 13.0 - 18.0 g/dL   HCT 47.1 40.0 - 52.0 %   MCV 87.9 80.0 - 100.0 fL   MCH 29.3 26.0 - 34.0 pg   MCHC 33.3 32.0 - 36.0 g/dL   RDW 13.5 11.5 - 14.5 %   Platelets 267 150 - 440 K/uL  Differential     Status: Abnormal   Collection Time: 07/01/16  8:10 PM  Result Value Ref Range   Neutrophils Relative % 52 %   Neutro Abs 5.5 1.4 - 6.5 K/uL   Lymphocytes Relative 35 %   Lymphs Abs 3.8 (H) 1.0 - 3.6 K/uL   Monocytes Relative 6 %   Monocytes Absolute 0.7 0.2 - 1.0 K/uL   Eosinophils Relative 6 %   Eosinophils Absolute 0.7 0 - 0.7 K/uL   Basophils Relative 1 %   Basophils Absolute 0.1 0 - 0.1 K/uL  Comprehensive metabolic panel     Status: Abnormal   Collection Time: 07/01/16  8:10 PM  Result Value Ref Range   Sodium 140 135 - 145 mmol/L   Potassium 3.5 3.5 - 5.1 mmol/L   Chloride 104 101 - 111 mmol/L   CO2 27 22 - 32 mmol/L   Glucose, Bld 102 (H) 65 - 99 mg/dL   BUN 15 6 - 20 mg/dL  Creatinine, Ser 1.11 0.61 - 1.24 mg/dL   Calcium 9.3 8.9 - 83.3 mg/dL   Total Protein 7.8 6.5 - 8.1 g/dL   Albumin 4.8 3.5 - 5.0 g/dL   AST 20 15 - 41 U/L   ALT 15 (L) 17 - 63 U/L   Alkaline Phosphatase 48 38 - 126 U/L   Total Bilirubin 0.7 0.3 - 1.2 mg/dL   GFR calc non Af Amer >60 >60 mL/min   GFR calc Af Amer >60 >60 mL/min    Comment: (NOTE) The eGFR has been calculated using the CKD EPI equation. This calculation has not been validated in all clinical situations. eGFR's persistently <60 mL/min signify possible Chronic Kidney Disease.    Anion gap 9 5 - 15  Troponin I     Status: None   Collection Time: 07/01/16  8:10 PM  Result Value Ref Range   Troponin I <0.03 <0.03 ng/mL  Ethanol     Status: None   Collection Time: 07/01/16  8:20 PM  Result Value Ref Range   Alcohol, Ethyl (B) <5 <5 mg/dL    Comment:        LOWEST DETECTABLE LIMIT FOR SERUM  ALCOHOL IS 5 mg/dL FOR MEDICAL PURPOSES ONLY   Urinalysis, Routine w reflex microscopic     Status: Abnormal   Collection Time: 07/01/16  8:20 PM  Result Value Ref Range   Color, Urine YELLOW (A) YELLOW   APPearance CLEAR (A) CLEAR   Specific Gravity, Urine 1.021 1.005 - 1.030   pH 5.0 5.0 - 8.0   Glucose, UA NEGATIVE NEGATIVE mg/dL   Hgb urine dipstick NEGATIVE NEGATIVE   Bilirubin Urine NEGATIVE NEGATIVE   Ketones, ur NEGATIVE NEGATIVE mg/dL   Protein, ur NEGATIVE NEGATIVE mg/dL   Nitrite NEGATIVE NEGATIVE   Leukocytes, UA NEGATIVE NEGATIVE  Urine Drug Screen, Qualitative ([facility] only)     Status: None   Collection Time: 07/01/16  8:20 PM  Result Value Ref Range   Tricyclic, Ur Screen NONE DETECTED NONE DETECTED   Amphetamines, Ur Screen NONE DETECTED NONE DETECTED   MDMA (Ecstasy)Ur Screen NONE DETECTED NONE DETECTED   Cocaine Metabolite,Ur [place] NONE DETECTED NONE DETECTED   Opiate, Ur Screen NONE DETECTED NONE DETECTED   Phencyclidine (PCP) Ur S NONE DETECTED NONE DETECTED   Cannabinoid 50 Ng, Ur [place] NONE DETECTED NONE DETECTED   Barbiturates, Ur Screen NONE DETECTED NONE DETECTED   Benzodiazepine, Ur Scrn NONE DETECTED NONE DETECTED   Methadone Scn, Ur NONE DETECTED NONE DETECTED    Comment: (NOTE) 100  Tricyclics, urine               Cutoff 1000 ng/mL 200  Amphetamines, urine             Cutoff 1000 ng/mL 300  MDMA (Ecstasy), urine           Cutoff 500 ng/mL 400  Cocaine Metabolite, urine       Cutoff 300 ng/mL 500  Opiate, urine                   Cutoff 300 ng/mL 600  Phencyclidine (PCP), urine      Cutoff 25 ng/mL 700  Cannabinoid, urine              Cutoff 50 ng/mL 800  Barbiturates, urine             Cutoff 200 ng/mL 900  Benzodiazepine, urine           Cutoff  200 ng/mL 1000 Methadone, urine                Cutoff 300 ng/mL 1100 1200 The urine drug screen provides only a preliminary, unconfirmed 1300 analytical test result and should not be used for non-medical 1400  purposes. Clinical consideration and professional judgment should 1500 be applied to any positive drug screen result due to possible 1600 interfering substances. A more specific alternate chemical method 1700 must be used in order to obtain a confirmed analytical result.  1800 Gas chromato graphy / mass spectrometry (GC/MS) is the preferred 1900 confirmatory method.   Glucose, capillary     Status: Abnormal   Collection Time: 07/01/16  8:45 PM  Result Value Ref Range   Glucose-Capillary 107 (H) 65 - 99 mg/dL  Lipid panel     Status: Abnormal   Collection Time: 07/02/16  3:53 AM  Result Value Ref Range   Cholesterol 197 0 - 200 mg/dL   Triglycerides 047 (H) <150 mg/dL   HDL 29 (L) >37 mg/dL   Total CHOL/HDL Ratio 6.8 RATIO   VLDL 41 (H) 0 - 40 mg/dL   LDL Cholesterol 256 (H) 0 - 99 mg/dL    Comment:        Total Cholesterol/HDL:CHD Risk Coronary Heart Disease Risk Table                     Men   Women  1/2 Average Risk   3.4   3.3  Average Risk       5.0   4.4  2 X Average Risk   9.6   7.1  3 X Average Risk  23.4   11.0        Use the calculated Patient Ratio above and the CHD Risk Table to determine the patient's CHD Risk.        ATP III CLASSIFICATION (LDL):  <100     mg/dL   Optimal  690-503  mg/dL   Near or Above                    Optimal  130-159  mg/dL   Borderline  997-137  mg/dL   High  >345     mg/dL   Very High   Hemoglobin A1c     Status: None   Collection Time: 07/02/16  3:53 AM  Result Value Ref Range   Hgb A1c MFr Bld 5.1 4.8 - 5.6 %    Comment: (NOTE)         Pre-diabetes: 5.7 - 6.4         Diabetes: >6.4         Glycemic control for adults with diabetes: <7.0    Mean Plasma Glucose 100 mg/dL    Comment: (NOTE) Performed At: [facility] [address] [place], [place] [id number] Mila Homer MD AT:[id number]   HIV antibody     Status: None   Collection Time: 07/02/16  3:53 AM  Result Value Ref Range   HIV Screen 4th Generation  wRfx Non Reactive Non Reactive    Comment: (NOTE) Performed At: [facility] [address] [place], [place] [id number] Mila Homer MD EO:[id number]     Dg Chest 2 View  Result Date: 07/02/2016 CLINICAL DATA:  Shortness of breath.  Weakness. EXAM: CHEST  2 VIEW COMPARISON:  No prior exams . FINDINGS: Mediastinum and hilar structures normal. Low lung volumes with mild bibasilar atelectasis. No pleural effusion or pneumothorax.  Heart size normal. Cervicothoracic spine fusion. IMPRESSION: Low lung volumes with mild bibasilar atelectasis . Electronically Signed   By: Marcello Moores  Register   On: 07/02/2016 10:02   Mr Brain W Wo Contrast  Result Date: 07/02/2016 CLINICAL DATA:  Blurred vision on the left over the last 2 weeks. Headache. Atrial fibrillation. Left-sided facial droop. EXAM: MRI HEAD WITHOUT AND WITH CONTRAST TECHNIQUE: Multiplanar, multiecho pulse sequences of the brain and surrounding structures were obtained without and with intravenous contrast. CONTRAST:  55m MULTIHANCE GADOBENATE DIMEGLUMINE 529 MG/ML IV SOLN COMPARISON:  CT 07/01/2016 FINDINGS: Brain: Brainstem and cerebellum are normal. Within the cerebral hemispheres, there are numerous scattered foci of T2 and FLAIR signal primarily within the deep white matter but with some involvement of the subcortical white matter. Some of these show low level restricted diffusion. Findings are most consistent with demyelinating disease/ multiple sclerosis. The possibility of small vessel infarctions is considerably less likely given the pattern. After contrast administration, enhancement occurs within 80 right posterior frontal subcortical white matter focus. There is also enhancement of a a left posterior parietal subcortical white matter focus. Small focus of enhancement seen within the right side of the corpus callosum. Vascular: Major vessels at the base of the brain show flow. Skull and upper cervical spine: Negative Sinuses/Orbits:  Clear/normal Other: None significant IMPRESSION: Multifocal white matter lesions most consistent with multiple sclerosis/ demyelinating disease. Deep and subcortical white matter involvement. Scattered areas of low level restricted diffusion. Contrast enhancement of 3 foci, in the right posterior frontal subcortical white matter, the right corpus callosum, in the left parietal subcortical white matter. Electronically Signed   By: MNelson ChimesM.D.   On: 07/02/2016 10:03   [place] (at [facility])  Result Date: 07/02/2016 CLINICAL DATA:  TIA. EXAM: BILATERAL CAROTID DUPLEX ULTRASOUND TECHNIQUE: GPearline Cablesscale imaging, color Doppler and duplex ultrasound were performed of bilateral carotid and vertebral arteries in the neck. COMPARISON:  MRI 07/02/2016 . FINDINGS: Criteria: Quantification of carotid stenosis is based on velocity parameters that correlate the residual internal carotid diameter with NASCET-based stenosis levels, using the diameter of the distal internal carotid lumen as the denominator for stenosis measurement. The following velocity measurements were obtained: RIGHT ICA:  59/26 cm/sec CCA:  793/26cm/sec SYSTOLIC ICA/CCA RATIO:  0.8 DIASTOLIC ICA/CCA RATIO:  1.3 ECA:  75 cm/sec LEFT ICA:  58/24 cm/sec CCA:  871/24cm/sec SYSTOLIC ICA/CCA RATIO:  0.7 DIASTOLIC ICA/CCA RATIO:  1.2 ECA:  74 cm/sec RIGHT CAROTID ARTERY: No significant carotid atherosclerotic vascular disease. No flow limiting stenosis. RIGHT VERTEBRAL ARTERY:  Patent with antegrade flow. LEFT CAROTID ARTERY: No significant carotid atherosclerotic vascular disease. No flow limiting stenosis . LEFT VERTEBRAL ARTERY:  Patent with antegrade flow. IMPRESSION: 1. No significant carotid atherosclerotic vascular disease. No flow limiting stenosis. 2. Vertebrals are patent antegrade flow . Electronically Signed   By: TMarcello Moores Register   On: 07/02/2016 11:22   Ct Head Code Stroke W/o Cm  Addendum Date: 07/01/2016   ADDENDUM REPORT:  07/01/2016 20:18 ADDENDUM: These results were called by telephone at the time of interpretation on 07/01/2016 at 8:18 pm to Dr. RLavonia Drafts, who verbally acknowledged these results. Electronically Signed   By: KUlyses JarredM.D.   On: 07/01/2016 20:18   Result Date: 07/01/2016 CLINICAL DATA:  Code stroke.  Left-sided facial droop and weakness. EXAM: CT HEAD WITHOUT CONTRAST TECHNIQUE: Contiguous axial images were obtained from the base of the skull through the vertex without intravenous  contrast. COMPARISON:  None. FINDINGS: Brain: No mass lesion, intraparenchymal hemorrhage or extra-axial collection. No evidence of acute cortical infarct. Brain parenchyma and CSF-containing spaces are normal for age. Vascular: No hyperdense vessel or unexpected calcification. Skull: Normal visualized skull base, calvarium and extracranial soft tissues. Sinuses/Orbits: No sinus fluid levels or advanced mucosal thickening. No mastoid effusion. Normal orbits. ASPECTS [facility] Stroke Program Early CT Score) - Ganglionic level infarction (caudate, lentiform nuclei, internal capsule, insula, M1-M3 cortex): 7 - Supraganglionic infarction (M4-M6 cortex): 3 Total score (0-10 with 10 being normal): 10 IMPRESSION: 1. Normal head CT. 2. ASPECTS is 10. I attempted to contact Dr. Lavonia Drafts at 8:02 p.m. and am currently awaiting a return phone call. Electronically Signed: By: Ulyses Jarred M.D. On: 07/01/2016 20:06    Blood pressure (!) 142/85, pulse 82, temperature 98 F (36.7 C), temperature source Oral, resp. rate 20, height '6\' 1"'$  (1.854 m), weight 132.9 kg (293 lb), SpO2 97 %.  Mental status: Alert and Oriented x 4  Visual Acuity:  20/20 OD  20/25-2 near [place]  Pupils:  Equally round/ reactive to light.  No Afferent pupillary defect.  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  2. Demyelinating disease on MRI: No sign of optic neuritis on exam.  Blurred vision may be due to dry eye but would confirm in eye clinic with refraction and formal visual field testing.  Delynn Flavin [facility] 07/03/2016, 1:06 PM

## 2016-07-03 NOTE — Plan of Care (Signed)
Problem: Education: Goal: Knowledge of  General Education information/materials will improve Outcome: Progressing VSS, free of falls during shift.  NIH 2.  Neuro checks stable.  Denies pain, no needs overnight.  Bed in low position, wife at bedside.  Call bell within reach, WCTM.

## 2016-07-03 NOTE — Plan of Care (Signed)
Problem: Health Behavior/Discharge Planning: Goal: Ability to manage health-related needs will improve Outcome: Progressing Eye md saw  Today  Solumedrol  Given  Today again

## 2016-07-04 NOTE — Plan of Care (Signed)
Problem: Education: Goal: Knowledge of [facility] Education information/materials will improve Outcome: Progressing VSS, free of falls during shift.  NIH 2.  Neuro checks stable.  HA resolved, denies pain.  No complaints overnight.  Wife at bedside, call bell within reach.  WCTM.

## 2016-07-04 NOTE — Progress Notes (Signed)
[facility]         [place], [place].   07/04/2016  Patient: Sean Espinoza   Date of Birth:  1976-03-21  Date of admission:  07/01/2016  Date of Discharge  07/04/2016    To Whom it May Concern:   Jeromey Cuny  may return to work on 07/15/2016.   If you have any questions or concerns, please don't hesitate to call.  Sincerely,   Milagros Loll R M.D Office : [phone]   .

## 2016-07-04 NOTE — Progress Notes (Signed)
Discharge instructions along with home medications and follow up gone over with patient and wife. Both verbalize that they understood instructions. No prescriptions given to patient. IV removed. Pt being discharged home on room air, no distress noted. Shaheen Mende S Fenton, RN 

## 2016-07-04 NOTE — Discharge Instructions (Signed)
No driving till you follow up with opthalmology.  Resume diet as before

## 2016-07-06 NOTE — Discharge Summary (Signed)
[facility] [place] [facility]   PATIENT NAME: Sean Espinoza    MR#:  [id number]  DATE OF BIRTH:  05/23/1975  DATE OF ADMISSION:  07/01/2016 ADMITTING PHYSICIAN: Delfino Lovett, MD  DATE OF DISCHARGE: 07/04/2016 11:20 AM  PRIMARY CARE PHYSICIAN: No PCP Per Patient   ADMISSION DIAGNOSIS:  TIA (transient ischemic attack) [G45.9] Facial droop [R29.810] Visual field cut [H53.40] Acute left-sided weakness [M62.89]  DISCHARGE DIAGNOSIS:  Active Problems:   TIA (transient ischemic attack)   Weakness   Blurred vision, left eye   SECONDARY DIAGNOSIS:  History reviewed. No pertinent past medical history.   ADMITTING HISTORY  HISTORY OF PRESENT ILLNESS:  Sean Espinoza  is a 41 y.o. male with a known history of Atrial fibrillation being admitted for possible stroke and or multiple sclerosis.  He reports having left eye blurry vision for about 2 weeks associated with some headache.  He notes the symptoms due to his busy work schedule.  Started having roaring sound in his left ear about 3-4 days ago.  Around 1 PM today he started having some left sided facial droop and weakness which persisted and led him to come to the emergency department.  He also reports some questionable left arm and leg weakness mainly noticed at urgent care visit.  Before coming down here.  His initial CT scan was negative. TeleNeurology evaluated him, he was out of TPA window. , they recommended admission with MRI, MRA and evaluation for possible stroke and/or multiple sclerosis    HOSPITAL COURSE:   * Multiple sclerosis flare with left eye blurred vision and left-sided weakness. Patient treated with high-dose IV steroids with 1 g of Solu-Medrol daily for 3 days. Symptoms have improved but not resolved completely. He was also seen by ophthalmology and suggested outpatient follow-up. No optic neuritis. He will need outpatient occupational therapy which has been set up. Discharge home to follow-up with  Dr. Epimenio Foot of neurology in [place] for multiple sclerosis. I have advised him to not drive until he follows with neurology regarding his blood vision.  Work note given for 10 days at discharge.  CONSULTS OBTAINED:  Treatment Team:  Thana Farr, MD Sherald Hess, MD  DRUG ALLERGIES:  No Known Allergies  DISCHARGE MEDICATIONS:   Discharge Medication List as of 07/04/2016 10:28 AM    CONTINUE these medications which have NOT CHANGED   Details  lansoprazole (PREVACID) 30 MG capsule Take 20 mg by mouth daily at 12 noon., Historical Med    metoprolol (LOPRESSOR) 50 MG tablet Take 50 mg by mouth 2 (two) times daily., Historical Med    sertraline (ZOLOFT) 100 MG tablet Take 100 mg by mouth daily., Historical Med        Today   VITAL SIGNS:  Blood pressure 129/82, pulse 70, temperature 97.6 F (36.4 C), temperature source Oral, resp. rate 18, height 6\' 1"  (1.854 m), weight 132.9 kg (293 lb), SpO2 96 %.  I/O:  No intake or output data in the 24 hours ending 07/06/16 1330  PHYSICAL EXAMINATION:  Physical Exam  GENERAL:  41 y.o.-year-old patient lying in the bed with no acute distress.  LUNGS: Normal breath sounds bilaterally, no wheezing, rales,rhonchi or crepitation. No use of accessory muscles of respiration.  CARDIOVASCULAR: S1, S2 normal. No murmurs, rubs, or gallops.  ABDOMEN: Soft, non-tender, non-distended. Bowel sounds present. No organomegaly or mass.  NEUROLOGIC: Moves all 4 extremities. Mild left-sided weakness PSYCHIATRIC: The patient is alert and oriented x 3.  SKIN:  No obvious rash, lesion, or ulcer.   DATA REVIEW:   CBC  Recent Labs Lab 07/01/16 2010  WBC 10.8*  HGB 15.7  HCT 47.1  PLT 267    Chemistries   Recent Labs Lab 07/01/16 2010  NA 140  K 3.5  CL 104  CO2 27  GLUCOSE 102*  BUN 15  CREATININE 1.11  CALCIUM 9.3  AST 20  ALT 15*  ALKPHOS 48  BILITOT 0.7    Cardiac Enzymes  Recent Labs Lab 07/01/16 2010   TROPONINI <0.03    Microbiology Results  No results found for this or any previous visit.  RADIOLOGY:  No results found.  Follow up with PCP in 1 week.  Management plans discussed with the patient, family and they are in agreement.  CODE STATUS:  Code Status History    Date Active Date Inactive Code Status Order ID Comments User Context   07/01/2016 11:23 PM 07/04/2016  2:31 PM Full Code [id number]  Delfino Lovett, MD ED      TOTAL TIME TAKING CARE OF THIS PATIENT ON DAY OF DISCHARGE: more than 30 minutes.   Milagros Loll R M.D on 07/06/2016 at 1:30 PM  Between 7am to 6pm - Pager - [phone]  After 6pm go to [email or web address] - password [email or web address] [facility]  [facility]  Office  [phone]  CC: Primary care physician; No PCP Per Patient  Note: This dictation was prepared with Dragon dictation along with smaller phrase technology. Any transcriptional errors that result from this process are unintentional.

## 2016-07-16 ENCOUNTER — Telehealth: Payer: Self-pay | Admitting: *Deleted

## 2016-07-16 ENCOUNTER — Telehealth: Payer: Self-pay | Admitting: Neurology

## 2016-07-16 ENCOUNTER — Ambulatory Visit: Payer: BLUE CROSS/BLUE SHIELD | Admitting: Neurology

## 2016-07-16 NOTE — Telephone Encounter (Signed)
Pt and his wife called because after driving 4 hours from [place] they were told the appointment was cancelled. Pt is new MS pt and has questions, he is unable to return to work until he has been seen. Pt said that he has until 4-20 to turn in FMLA paperwork or he will be without a job.  Pt did agree to have another appointment rescheduled and to be put on a wait list but he would very much like to be seen on 07-17-2016 because of the cancellation being done due to no fault of his own. He can be reached on his moblile but also gave his wife # [phone]

## 2016-07-16 NOTE — Telephone Encounter (Signed)
Left message letting him know that our office is without power and his appt has been canceled today.  Provided our number to call back to reschedule his new patient appt.

## 2016-07-16 NOTE — Telephone Encounter (Signed)
I have spoken with pt's wife, Rosey Bath, and given appt. tomorrow afternoon, arrival time of 3pm, then will fit him in asap/fim

## 2016-07-17 ENCOUNTER — Ambulatory Visit (INDEPENDENT_AMBULATORY_CARE_PROVIDER_SITE_OTHER): Payer: BLUE CROSS/BLUE SHIELD | Admitting: Neurology

## 2016-07-17 ENCOUNTER — Other Ambulatory Visit: Payer: Self-pay | Admitting: *Deleted

## 2016-07-17 ENCOUNTER — Encounter: Payer: Self-pay | Admitting: Neurology

## 2016-07-17 DIAGNOSIS — H469 Unspecified optic neuritis: Secondary | ICD-10-CM | POA: Diagnosis not present

## 2016-07-17 DIAGNOSIS — R202 Paresthesia of skin: Secondary | ICD-10-CM | POA: Insufficient documentation

## 2016-07-17 DIAGNOSIS — R200 Anesthesia of skin: Secondary | ICD-10-CM | POA: Diagnosis not present

## 2016-07-17 DIAGNOSIS — G35 Multiple sclerosis: Secondary | ICD-10-CM | POA: Insufficient documentation

## 2016-07-17 DIAGNOSIS — Z79899 Other long term (current) drug therapy: Secondary | ICD-10-CM

## 2016-07-17 DIAGNOSIS — R269 Unspecified abnormalities of gait and mobility: Secondary | ICD-10-CM

## 2016-07-17 DIAGNOSIS — G35D Multiple sclerosis, unspecified: Secondary | ICD-10-CM

## 2016-07-17 NOTE — Progress Notes (Signed)
[facility]  PATIENT: Sean Espinoza DOB: [birth year]  REFERRING DOCTOR OR PCP:  Referred by Dr. Thad Ranger SOURCE: paitent, ED notes, imaging reports, lab reports, MRI images on PACS  _________________________________   HISTORICAL  CHIEF COMPLAINT:  Chief Complaint  Patient presents with  . Abnormal MRI    Sts. mid March 2018, he began having blurry vision left eye.  Sts. he was seen in the ER at [facility] where he was told MRI showed MS lesions. He received 3 days of IV steroids. Denies family hx. of MS./fim    HISTORY OF PRESENT ILLNESS:  I had the pleasure seeing you patient, Sean Espinoza, at [facility] for neurologic consultation regarding his abnormal MRI worrisome for multiple sclerosis.  In mid March, he had the onset of blurry vision out of the left eye. He noted that the visual field was worse on the left side than the right side. Additionally he was unable to read out of the left eye. Colors were mildly desaturated. On April 2, he had the onset of left facial numbness. Over the next day the numbness increased to involve the entire left side, worse in the arm than the leg. He also noted mild weakness in the left arm and reduced balance and gait.[name] He presented to [facility] and an MRI showed many T2/FLAIR hyperintense foci, a few that enhanced, consistent with the diagnosis of MS. He was admitted and received 3 days of IV Solu-Medrol. His symptoms improved some and he was discharged.   His vision is better now than it was last month but is not back to baseline.  He notes that he is still having some difficulty with his gait, especially balance. He has not fallen. He feels dizzy at times.     He notes mild weakness in the left arm and feels strong elsewhere. The numbness on the left side of his body has almost entirely resolved though there is still limited a bit of numbness in the left arm.  He denies  any difficulty with bowel or bladder function.  He notes that he has been much more fatigued the last month. He was feeling a little bit better but the fatigue worsened again this week. He is sleeping well most nights and only has occasional nights when he has trouble staying asleep.  He denies any significant problems with depression.  He is on sertraline for anxiety.. About 6 months ago he began to note decreased focus and attention. This has persisted. It is not necessarily worse the last month.  I personally reviewed the MRI of the brain dated 07/02/2016. It shows many T2/FLAIR hyperintense foci many in the periventricular and  juxtacortical white matter. There is a normal hemisphere focus and 2 small pontine foci.   4 foci enhanced after contrast administration (right frontal subcortical white matter, right corpus callosum, left parietal subcortical white matter and left mesial temporal lobe)  REVIEW OF SYSTEMS: Constitutional: No fevers, chills, sweats, or change in appetite.   Notes fatigue. Eyes: No visual changes, double vision, eye pain Ear, nose and throat: No hearing loss, ear pain, nasal congestion, sore throat Cardiovascular: No chest pain, palpitations Respiratory: No shortness of breath at rest or with exertion.   No wheezes GastrointestinaI: No nausea, vomiting, diarrhea, abdominal pain, fecal incontinence Genitourinary: No dysuria, urinary retention or frequency.  No nocturia. Musculoskeletal: No neck pain, back pain Integumentary: No rash, pruritus, skin lesions Neurological: as above Psychiatric: No depression  at this time.  Mild anxiety Endocrine: No palpitations, diaphoresis, change in appetite, change in weigh or increased thirst Hematologic/Lymphatic: No anemia, purpura, petechiae. Allergic/Immunologic: No itchy/runny eyes, nasal congestion, recent allergic reactions, rashes  ALLERGIES: No Known Allergies  HOME MEDICATIONS:  Current Outpatient Prescriptions:    .  lansoprazole (PREVACID) 30 MG capsule, Take 20 mg by mouth daily at 12 noon., Disp: , Rfl:  .  metoprolol (LOPRESSOR) 50 MG tablet, Take 50 mg by mouth 2 (two) times daily., Disp: , Rfl:  .  sertraline (ZOLOFT) 100 MG tablet, Take 100 mg by mouth daily., Disp: , Rfl:   PAST MEDICAL HISTORY: Past Medical History:  Diagnosis Date  . Hypertension   . Vision abnormalities     PAST SURGICAL HISTORY: Past Surgical History:  Procedure Laterality Date  . CERVICAL FUSION  2005, 2014    FAMILY HISTORY: Family History  Problem Relation Age of Onset  . Lupus Mother   . Healthy Father   . Lymphoma Maternal Grandfather   . Colon cancer Paternal Grandmother     SOCIAL HISTORY:  Social History   Social History  . Marital status: Married    Spouse name: N/A  . Number of children: N/A  . Years of education: N/A   Occupational History  . Not on file.   Social History Main Topics  . Smoking status: Former Smoker    Quit date: 2003  . Smokeless tobacco: Never Used  . Alcohol use No  . Drug use: No  . Sexual activity: Not on file   Other Topics Concern  . Not on file   Social History Narrative  . No narrative on file     PHYSICAL EXAM  Vitals:   07/17/16 1533  BP: 138/86  Pulse: 77  Resp: 18  Weight: 289 lb 8 oz (131.3 kg)  Height: 6\' 1"  (1.854 m)    Body mass index is 38.19 kg/m.   General: The patient is well-developed and well-nourished and in no acute distress  Eyes:  Funduscopic exam shows normal optic discs and retinal vessels.  Neck: The neck is supple, no carotid bruits are noted.  The neck is nontender.  Cardiovascular: The heart has a regular rate and rhythm with a normal S1 and S2. There were no murmurs, gallops or rubs. Lungs are clear to auscultation.  Skin: Extremities are without significant edema.  Musculoskeletal:  Back is nontender  Neurologic Exam  Mental status: The patient is alert and oriented x 3 at the time of the examination.  The patient has apparent normal recent and remote memory, with an apparently normal attention span and concentration ability.   Speech is normal.  Cranial nerves: Extraocular movements are full. Pupils Show a 1+ left afferent pupillary defect.  He has mildly reduced visual acuity out of the left eye. Colors are desaturated out of the left eye.  Visual field with finger counting is normal bilaterally. Facial symmetry is present. There is good facial sensation to soft touch bilaterally.Facial strength is normal.  Trapezius and sternocleidomastoid strength is normal. No dysarthria is noted.  The tongue is midline, and the patient has symmetric elevation of the soft palate. No obvious hearing deficits are noted.  Motor:  Muscle bulk is normal.   Tone is normal. Strength is  5 / 5 in all 4 extremities.   Sensory: Sensory testing is intact to pinprick and temperature and touch in all 4 extremities.  There is reduced vibration sensation in the left arm.  Coordination: Cerebellar testing reveals good finger-nose-finger and heel-to-shin bilaterally.  Gait and station: Station is normal.   Gait is . There is no foot drop. Tandem walk is very wide. Romberg is negative.   Reflexes: Deep tendon reflexes are symmetric and increased at the knees with spread. Deep tendon reflexes are increased at the ankles without clonus.    Plantar responses are flexor.    DIAGNOSTIC DATA (LABS, IMAGING, TESTING) - I reviewed patient records, labs, notes, testing and imaging myself where available.  Lab Results  Component Value Date   WBC 10.8 (H) 07/01/2016   HGB 15.7 07/01/2016   HCT 47.1 07/01/2016   MCV 87.9 07/01/2016   PLT 267 07/01/2016      Component Value Date/Time   NA 140 07/01/2016 2010   K 3.5 07/01/2016 2010   CL 104 07/01/2016 2010   CO2 27 07/01/2016 2010   GLUCOSE 102 (H) 07/01/2016 2010   BUN 15 07/01/2016 2010   CREATININE 1.11 07/01/2016 2010   CALCIUM 9.3 07/01/2016 2010   PROT 7.8  07/01/2016 2010   ALBUMIN 4.8 07/01/2016 2010   AST 20 07/01/2016 2010   ALT 15 (L) 07/01/2016 2010   ALKPHOS 48 07/01/2016 2010   BILITOT 0.7 07/01/2016 2010   GFRNONAA >60 07/01/2016 2010   GFRAA >60 07/01/2016 2010   Lab Results  Component Value Date   CHOL 197 07/02/2016   HDL 29 (L) 07/02/2016   LDLCALC 127 (H) 07/02/2016   TRIG 206 (H) 07/02/2016   CHOLHDL 6.8 07/02/2016   Lab Results  Component Value Date   HGBA1C 5.1 07/02/2016        ASSESSMENT AND PLAN  Multiple sclerosis (HCC) - Plan: Quantiferon tb gold assay, Stratify JCV Antibody Test (Quest), Hepatic Function Panel, Hep B Surface Antibody, Hep B Surface Antigen, Hepatitis B Core AB, Total, Varicella zoster antibody, IgG  High risk medication use - Plan: Quantiferon tb gold assay, Stratify JCV Antibody Test (Quest), Hepatic Function Panel, Hep B Surface Antibody, Hep B Surface Antigen, Hepatitis B Core AB, Total, Varicella zoster antibody, IgG  Gait disturbance  Left optic neuritis  Numbness   In summary, Mr. Jeanlouis is a 41 year old man who had optic neuritis on the left last month and left-sided numbness and gait disturbance this month. His MRI is very consistent with MS showing lesions in the periventricular white matter, juxtacortical white matter, brain stem and cerebellum. 4 foci enhanced after contrast consistent with more acute lesions. He meets the McDonald criteria and he does not need to have a lumbar puncture performed.   I reviewed his brain MRI in the presence of Mr. Senat, his wife and his parents.  Questions were answered. We went over treatment options. He would prefer an oral agent and we spent most of the time discussing Tecfidera and Aubagio as well as an oral agent in clinical trials.    We will check blood work and that him know the results and he will think a little bit more about those 2 options. We also need to check an MRI of the cervical spine due to his gait disturbance,  hyperreflexia(to determine if he has an MS-related transverse myelitis or a compressive myelopathy that could also be playing a role in his symptoms.  He reports a lot of fatigue. If this persists, consider modafinil or a stimulant.   He should be able to return to work next week and I gave him a note. We reviewed the symptoms of relapses and he  is to call us if he is concerned about new worsening neurologic symptoms.   Otherwise, he will return to see me for visit in 2 months.  Thank you for asking me to see Mr. Curington. Please let me know if I can be of further assistance with MRI of the patients in the future.    Richard A. Epimenio Foot, MD, PhD 07/17/2016, 5:48 PM Dir., [facility]  Certified in Neurology, Clinical Neurophysiology, Sleep Medicine, Pain Medicine and Neuroimaging   [facility] [address] [place], [place] [address] [phone]

## 2016-07-18 LAB — VARICELLA ZOSTER ANTIBODY, IGG: Varicella zoster IgG: >4000 index (ref >165)

## 2016-07-18 LAB — HEPATIC FUNCTION PANEL
ALT: 17 IU/L (ref 0–44)
AST: 14 IU/L (ref 0–40)
Albumin: 4.8 g/dL (ref 3.5–5.5)
Alkaline Phosphatase: 61 IU/L (ref 39–117)
Bilirubin Total: 0.4 mg/dL (ref 0.0–1.2)
Bilirubin, Direct: 0.09 mg/dL (ref 0.00–0.40)
TOTAL PROTEIN: 7.3 g/dL (ref 6.0–8.5)

## 2016-07-18 LAB — HEPATITIS B CORE ANTIBODY, TOTAL: Hep B Core Total Ab: NEGATIVE

## 2016-07-18 LAB — HEPATITIS B SURFACE ANTIGEN: HEP B S AG: NEGATIVE

## 2016-07-18 LAB — HEPATITIS B SURFACE ANTIBODY,QUALITATIVE: HEP B SURFACE AB, QUAL: NONREACTIVE

## 2016-07-19 LAB — QUANTIFERON IN TUBE
QFT TB AG MINUS NIL VALUE: 0 [IU]/mL
QUANTIFERON MITOGEN VALUE: 5.84 IU/mL
QUANTIFERON TB AG VALUE: 0.1 IU/mL
QUANTIFERON TB GOLD: NEGATIVE
Quantiferon Nil Value: 0.1 IU/mL

## 2016-07-19 LAB — QUANTIFERON TB GOLD ASSAY (BLOOD)

## 2016-07-23 ENCOUNTER — Telehealth: Payer: Self-pay | Admitting: *Deleted

## 2016-07-23 NOTE — Telephone Encounter (Signed)
I have spoken with Sean Espinoza this afternoon and per RAS, advised that lab work done in our office is ok--he can start either Aubagio or Tecfidera, as discussed in last ov.  He verbalized understanding of same, sts. would like to start Tecfidera.  SRF completed and faxed to [employer]/fim

## 2016-07-24 ENCOUNTER — Ambulatory Visit (INDEPENDENT_AMBULATORY_CARE_PROVIDER_SITE_OTHER): Payer: BLUE CROSS/BLUE SHIELD

## 2016-07-24 DIAGNOSIS — R200 Anesthesia of skin: Secondary | ICD-10-CM

## 2016-07-24 DIAGNOSIS — R269 Unspecified abnormalities of gait and mobility: Secondary | ICD-10-CM

## 2016-07-24 DIAGNOSIS — G35 Multiple sclerosis: Secondary | ICD-10-CM

## 2016-07-24 MED ORDER — GADOPENTETATE DIMEGLUMINE 469.01 MG/ML IV SOLN: 20.0000 mL | Freq: Once | INTRAVENOUS | Status: AC | PRN

## 2016-07-25 NOTE — Telephone Encounter (Signed)
Fax received from [facility]. Pt.'s Tecfidera rx. has been sent to [facility], phone# [phone]/fim

## 2016-07-29 ENCOUNTER — Telehealth: Payer: Self-pay | Admitting: *Deleted

## 2016-07-29 ENCOUNTER — Encounter: Payer: Self-pay | Admitting: *Deleted

## 2016-07-29 NOTE — Telephone Encounter (Signed)
I spoke with Sean Espinoza last week.  Tecfidera srf was completed and sent in/fim

## 2016-07-29 NOTE — Telephone Encounter (Signed)
Tecfidera PA completed by phone with [facility]. PA# [id number].  Pt. is a new dx. and so has not been on any other dmt's for MS/fim

## 2016-07-29 NOTE — Telephone Encounter (Signed)
I have spoken with [facility]. They will fax a PA form for Tecfidera to me at [phone]/fim

## 2016-07-29 NOTE — Telephone Encounter (Signed)
Sh[facility] is requesting prior auth. For pt Tecfidera  Call [phone]

## 2016-07-30 ENCOUNTER — Telehealth: Payer: Self-pay | Admitting: Neurology

## 2016-07-30 NOTE — Telephone Encounter (Signed)
I have spoken with Sean Espinoza and amended FMLA paperwork as requested, and faxed to [facility] [employer]/fim

## 2016-07-30 NOTE — Telephone Encounter (Signed)
Patients wife called office in reference to incomplete FLMA paperwork wife would like to know if she is able to forward the email to our clinic to have the paperwork completed. Please call

## 2016-08-05 ENCOUNTER — Ambulatory Visit: Payer: BLUE CROSS/BLUE SHIELD | Admitting: Neurology

## 2016-08-13 NOTE — Telephone Encounter (Signed)
Fax received from [facility]. Tecfidera approved for dates 07/29/16 thru 07/30/2018. PA# [employer] [id number] SS/fim

## 2016-08-27 ENCOUNTER — Ambulatory Visit (INDEPENDENT_AMBULATORY_CARE_PROVIDER_SITE_OTHER): Payer: BLUE CROSS/BLUE SHIELD | Admitting: Neurology

## 2016-08-27 ENCOUNTER — Encounter: Payer: Self-pay | Admitting: Neurology

## 2016-08-27 VITALS — BP 128/87 | HR 79 | Resp 16 | Ht 73.0 in | Wt 290.0 lb

## 2016-08-27 DIAGNOSIS — H469 Unspecified optic neuritis: Secondary | ICD-10-CM

## 2016-08-27 DIAGNOSIS — G35D Multiple sclerosis, unspecified: Secondary | ICD-10-CM

## 2016-08-27 DIAGNOSIS — R269 Unspecified abnormalities of gait and mobility: Secondary | ICD-10-CM

## 2016-08-27 DIAGNOSIS — R200 Anesthesia of skin: Secondary | ICD-10-CM | POA: Diagnosis not present

## 2016-08-27 DIAGNOSIS — M5432 Sciatica, left side: Secondary | ICD-10-CM

## 2016-08-27 DIAGNOSIS — G35 Multiple sclerosis: Secondary | ICD-10-CM | POA: Diagnosis not present

## 2016-08-27 MED ORDER — ETODOLAC 400 MG PO TABS: 400.0000 mg | ORAL_TABLET | Freq: Two times a day (BID) | ORAL | 1 refills | Status: DC

## 2016-08-27 MED ORDER — CYCLOBENZAPRINE HCL 5 MG PO TABS: 5.0000 mg | ORAL_TABLET | Freq: Three times a day (TID) | ORAL | 1 refills | Status: DC | PRN

## 2016-08-27 NOTE — Progress Notes (Signed)
[facility]  PATIENT: Sean Espinoza DOB: Feb 12, 1976  REFERRING DOCTOR OR PCP:  Referred by Dr. Thad Ranger SOURCE: paitent, ED notes, imaging reports, lab reports, MRI images on PACS  _________________________________   HISTORICAL  CHIEF COMPLAINT:  Chief Complaint  Patient presents with  . Multiple Sclerosis    Sts. he is tolerating Tecfidera well with just some flushing.  Today he has new c/o left lower back pain radiating down back and front of left leg to knee.  First noted while getting out of bed 2 days ago.  Pain worse with bearing wt. on left leg/fim    HISTORY OF PgRESENT ILLNESS:   Sean Espinoza is a 41 yo man diagnosed with multiple sclerosis in early 2018.   He is now reporting a lot of left lower back pain radiating down the left leg.  Left Sciatica:   Two days ago, he woke up and when he got out of bed noted shooting pain down the left leg.   He notes no numbness but has tingling in that leg.   He feels weight bearing is worse on that leg.   He has no h/o lumbar spine issues.   He only notes mild buttock pain when sitting.   However, pain goes down the left leg when sitting on the left cheek.   MS:   He is tolerating Tecfidera ok.  He has had some flushing.    He denies any significant GI issues.   He has GERD and that is unchanged.    No new exacerbation  Gait/strength/sensation   He reports that his gait is doing well. He he denies any stumbling or falls. He notes no major difficulty with strength or sensation in the arms or legs.   The numbness that he was having on the left side has resolved.  Bladder: He denies any significant problem with bladder urgency or frequency or hesitancy. He does not have any incontinence.  Vision: He denies any difficulty with visual acuity, diplopia or eye pain.  Fatigue/sleep:  He notes mild fatigue, physical more than cognitive. Fatigue is worse with heat.[name]  He is sleeping worse since the leg pain started.    .  Mood/cognition:    This is stable.   He denies any significant problems with depression.  He is on sertraline for anxiety.. About 6 months ago he began to note decreased focus and attention. This has persisted. It is not necessarily worse the last month.  MS History:   In mid March, he had the onset of blurry vision out of the left eye. He noted that the visual field was worse on the left side than the right side. Additionally he was unable to read out of the left eye. Colors were mildly desaturated. On April 2, he had the onset of left facial numbness. Over the next day the numbness increased to involve the entire left side, worse in the arm than the leg. He also noted mild weakness in the left arm and reduced balance and gait.[name] He presented to [facility] and an MRI 07/02/2016 showed many T2/FLAIR hyperintense foci, four foci  enhanced  (right frontal subcortical white matter, right corpus callosum, left parietal subcortical white matter and left mesial temporal lobe), consistent with the diagnosis of MS. He was admitted and received 3 days of IV Solu-Medrol. His symptoms improved some and he was discharged.  He was referred to me and Gilford Raid was started in March 2018.     REVIEW  OF SYSTEMS: Constitutional: No fevers, chills, sweats, or change in appetite.   Notes fatigue. Eyes: No visual changes, double vision, eye pain Ear, nose and throat: No hearing loss, ear pain, nasal congestion, sore throat Cardiovascular: No chest pain, palpitations Respiratory: No shortness of breath at rest or with exertion.   No wheezes GastrointestinaI: No nausea, vomiting, diarrhea, abdominal pain, fecal incontinence Genitourinary: No dysuria, urinary retention or frequency.  No nocturia. Musculoskeletal: No neck pain, back pain Integumentary: No rash, pruritus, skin lesions Neurological: as above Psychiatric: No depression at this time.  Mild anxiety Endocrine: No palpitations, diaphoresis, change in  appetite, change in weigh or increased thirst Hematologic/Lymphatic: No anemia, purpura, petechiae. Allergic/Immunologic: No itchy/runny eyes, nasal congestion, recent allergic reactions, rashes  ALLERGIES: No Known Allergies  HOME MEDICATIONS:  Current Outpatient Prescriptions:  .  Dimethyl Fumarate 240 MG CPDR, Take by mouth., Disp: , Rfl:  .  lansoprazole (PREVACID) 30 MG capsule, Take 20 mg by mouth daily at 12 noon., Disp: , Rfl:  .  metoprolol (LOPRESSOR) 50 MG tablet, Take 50 mg by mouth 2 (two) times daily., Disp: , Rfl:  .  sertraline (ZOLOFT) 100 MG tablet, Take 100 mg by mouth daily., Disp: , Rfl:  .  cyclobenzaprine (FLEXERIL) 5 MG tablet, Take 1 tablet (5 mg total) by mouth every 8 (eight) hours as needed for muscle spasms., Disp: 90 tablet, Rfl: 1 .  etodolac (LODINE) 400 MG tablet, Take 1 tablet (400 mg total) by mouth 2 (two) times daily., Disp: 60 tablet, Rfl: 1 No current facility-administered medications for this visit.   Facility-Administered Medications Ordered in Other Visits:  .  gadopentetate dimeglumine (MAGNEVIST) injection 20 mL, 20 mL, Intravenous, Once PRN, Harlow Carrizales, Pearletha Furl, MD  PAST MEDICAL HISTORY: Past Medical History:  Diagnosis Date  . Hypertension   . Vision abnormalities     PAST SURGICAL HISTORY: Past Surgical History:  Procedure Laterality Date  . CERVICAL FUSION  2005, 2014    FAMILY HISTORY: Family History  Problem Relation Age of Onset  . Lupus Mother   . Healthy Father   . Lymphoma Maternal Grandfather   . Colon cancer Paternal Grandmother     SOCIAL HISTORY:  Social History   Social History  . Marital status: Married    Spouse name: N/A  . Number of children: N/A  . Years of education: N/A   Occupational History  . Not on file.   Social History Main Topics  . Smoking status: Former Smoker    Quit date: 2003  . Smokeless tobacco: Never Used  . Alcohol use No  . Drug use: No  . Sexual activity: Not on file    Other Topics Concern  . Not on file   Social History Narrative  . No narrative on file     PHYSICAL EXAM  Vitals:   08/27/16 1119  BP: 128/87  Pulse: 79  Resp: 16  Weight: 290 lb (131.5 kg)  Height: 6\' 1"  (1.854 m)    Body mass index is 38.26 kg/m.   General: The patient is well-developed and well-nourished and in no acute distress  Musculoskeletal: He is moderately tender over the left piriformis muscle and mildly tender over the lower lumbar paraspinal muscles.   Neurologic Exam  Mental status: The patient is alert and oriented x 3 at the time of the examination. The patient has apparent normal recent and remote memory, with an apparently normal attention span and concentration ability.   Speech is normal.  Cranial nerves: Extraocular movements are full. Pupils Show a 1+ left afferent pupillary defect.    Colors are desaturated out of the left eye.   Facial strength and sensation is normal.  Trapezius and sternocleidomastoid strength is normal. No dysarthria is noted.  The tongue is midline, and the patient has symmetric elevation of the soft palate. No obvious hearing deficits are noted.  Motor:  Muscle bulk is normal.   Tone is normal. Strength is  5 / 5 in all 4 extremities.   Sensory: Sensory testing is intact to pinprick and temperature and touch in all 4 extremities.  There is reduced vibration sensation in the left arm.  Coordination: Cerebellar testing reveals good finger-nose-finger and heel-to-shin bilaterally.  Gait and station: Station is normal.   Gait is mildly arthritic . There is no foot drop. Tandem walk is very wide. Romberg is negative.   Reflexes: Deep tendon reflexes are symmetric and increased at the knees with spread. Deep tendon reflexes are increased at the ankles without clonus.       DIAGNOSTIC DATA (LABS, IMAGING, TESTING) - I reviewed patient records, labs, notes, testing and imaging myself where available.  Lab Results  Component  Value Date   WBC 10.8 (H) 07/01/2016   HGB 15.7 07/01/2016   HCT 47.1 07/01/2016   MCV 87.9 07/01/2016   PLT 267 07/01/2016      Component Value Date/Time   NA 140 07/01/2016 2010   K 3.5 07/01/2016 2010   CL 104 07/01/2016 2010   CO2 27 07/01/2016 2010   GLUCOSE 102 (H) 07/01/2016 2010   BUN 15 07/01/2016 2010   CREATININE 1.11 07/01/2016 2010   CALCIUM 9.3 07/01/2016 2010   PROT 7.3 07/17/2016 1625   ALBUMIN 4.8 07/17/2016 1625   AST 14 07/17/2016 1625   ALT 17 07/17/2016 1625   ALKPHOS 61 07/17/2016 1625   BILITOT 0.4 07/17/2016 1625   GFRNONAA >60 07/01/2016 2010   GFRAA >60 07/01/2016 2010   Lab Results  Component Value Date   CHOL 197 07/02/2016   HDL 29 (L) 07/02/2016   LDLCALC 127 (H) 07/02/2016   TRIG 206 (H) 07/02/2016   CHOLHDL 6.8 07/02/2016   Lab Results  Component Value Date   HGBA1C 5.1 07/02/2016        ASSESSMENT AND PLAN  Multiple sclerosis (HCC)  Left optic neuritis  Gait disturbance  Numbness  Sciatica, left side   1.   Continue Tecfidera. 2.   The back and leg pain could be due to either a sciatica, possibly from a piriformis syndrome or a left lumbar radiculopathy.   I did a trigger point injection with 80 mg Depo-Medrol in 4 mL Marcaine into the left piriformis muscle. He tolerated the procedure well. If he is not better next week, consider a lumbar MRI. 3.    Try to remain active and exercise as tolerated. 4.    Return for regular visit in 4 months but sooner if there are new or worsening symptoms. All back in a week or 2 if the back and leg are not any better.   Yuette Putnam A. Epimenio Foot, MD, PhD 08/27/2016, 5:54 PM Dir., [facility]  Certified in Neurology, Clinical Neurophysiology, Sleep Medicine, Pain Medicine and Neuroimaging   [facility] [address] [place], [place] [address] [phone]

## 2016-09-01 DIAGNOSIS — E8881 Metabolic syndrome: Secondary | ICD-10-CM | POA: Insufficient documentation

## 2016-09-01 DIAGNOSIS — I10 Essential (primary) hypertension: Secondary | ICD-10-CM | POA: Insufficient documentation

## 2016-09-01 DIAGNOSIS — E6609 Other obesity due to excess calories: Secondary | ICD-10-CM | POA: Insufficient documentation

## 2016-09-01 DIAGNOSIS — F419 Anxiety disorder, unspecified: Secondary | ICD-10-CM | POA: Insufficient documentation

## 2016-09-01 DIAGNOSIS — E782 Mixed hyperlipidemia: Secondary | ICD-10-CM | POA: Insufficient documentation

## 2016-09-01 DIAGNOSIS — L03116 Cellulitis of left lower limb: Secondary | ICD-10-CM | POA: Insufficient documentation

## 2016-09-01 DIAGNOSIS — L03112 Cellulitis of left axilla: Secondary | ICD-10-CM | POA: Insufficient documentation

## 2016-09-01 DIAGNOSIS — Z6831 Body mass index (BMI) 31.0-31.9, adult: Secondary | ICD-10-CM | POA: Insufficient documentation

## 2016-09-01 DIAGNOSIS — K219 Gastro-esophageal reflux disease without esophagitis: Secondary | ICD-10-CM | POA: Insufficient documentation

## 2016-09-03 ENCOUNTER — Ambulatory Visit
Admission: EM | Admit: 2016-09-03 | Discharge: 2016-09-03 | Disposition: A | Discharge status: Home / Self Care | Payer: BLUE CROSS/BLUE SHIELD | Attending: Family Medicine | Admitting: Family Medicine

## 2016-09-03 DIAGNOSIS — L02416 Cutaneous abscess of left lower limb: Secondary | ICD-10-CM

## 2016-09-03 DIAGNOSIS — L03116 Cellulitis of left lower limb: Secondary | ICD-10-CM

## 2016-09-03 HISTORY — DX: Multiple sclerosis: G35

## 2016-09-03 HISTORY — DX: Unspecified atrial fibrillation: I48.91

## 2016-09-03 MED ORDER — SULFAMETHOXAZOLE-TRIMETHOPRIM 800-160 MG PO TABS
1.0000 | ORAL_TABLET | Freq: Once | ORAL | Status: AC
Start: 2016-09-03 — End: 2016-09-03
  Administered 2016-09-03: 1 via ORAL

## 2016-09-03 MED ORDER — SULFAMETHOXAZOLE-TRIMETHOPRIM 800-160 MG PO TABS: 1.0000 | ORAL_TABLET | Freq: Two times a day (BID) | ORAL | 0 refills | Status: DC

## 2016-09-03 MED ORDER — CEFAZOLIN SODIUM 1 G IJ SOLR
1.0000 g | Freq: Once | INTRAMUSCULAR | Status: AC
  Administered 2016-09-03: 1 g via INTRAMUSCULAR

## 2016-09-03 NOTE — ED Provider Notes (Signed)
[facility]    CSN: [id number] Arrival date & time: 09/03/16  1954     History   Chief Complaint Chief Complaint  Patient presents with  . Recurrent Skin Infections    HPI Sean Espinoza is a 41 y.o. male.   41 yo male with a c/o left leg boil for 5 days. States he was seen at an urgent care in [place] 2 days ago and given Rocephin and started on Keflex. Patient states that redness and pain has worsened. Denies any fevers or chills.    The history is provided by the patient.    Past Medical History:  Diagnosis Date  . Atrial fibrillation (HCC)   . Hypertension   . MS (multiple sclerosis) (HCC)   . Vision abnormalities     Patient Active Problem List   Diagnosis Date Noted  . Sciatica, left side 08/27/2016  . Multiple sclerosis (HCC) 07/17/2016  . Gait disturbance 07/17/2016  . Left optic neuritis 07/17/2016  . Numbness 07/17/2016  . Blurred vision, left eye   . TIA (transient ischemic attack) 07/01/2016  . Weakness 07/01/2016    Past Surgical History:  Procedure Laterality Date  . CERVICAL FUSION  2005, 2014       Home Medications    Prior to Admission medications   Medication Sig Start Date End Date Taking? Authorizing Provider  cyclobenzaprine (FLEXERIL) 5 MG tablet Take 1 tablet (5 mg total) by mouth every 8 (eight) hours as needed for muscle spasms. 08/27/16  Yes Sater, Pearletha Furl, MD  Dimethyl Fumarate 240 MG CPDR Take by mouth.   Yes [provider]  etodolac (LODINE) 400 MG tablet Take 1 tablet (400 mg total) by mouth 2 (two) times daily. 08/27/16  Yes Sater, Pearletha Furl, MD  metoprolol (LOPRESSOR) 50 MG tablet Take 50 mg by mouth 2 (two) times daily.   Yes [provider]  omeprazole (PRILOSEC) 20 MG capsule Take 20 mg by mouth daily.   Yes [provider]  sertraline (ZOLOFT) 100 MG tablet Take 100 mg by mouth daily.   Yes [provider]  lansoprazole (PREVACID) 30 MG capsule Take 20 mg by mouth  daily at 12 noon.    [provider]  sulfamethoxazole-trimethoprim (BACTRIM DS,SEPTRA DS) 800-160 MG tablet Take 1 tablet by mouth 2 (two) times daily. 09/03/16   Payton Mccallum, MD    Family History Family History  Problem Relation Age of Onset  . Lupus Mother   . Healthy Father   . Lymphoma Maternal Grandfather   . Colon cancer Paternal Grandmother     Social History Social History  Substance Use Topics  . Smoking status: Former Smoker    Quit date: 2003  . Smokeless tobacco: Never Used  . Alcohol use No     Allergies   Patient has no known allergies.   Review of Systems Review of Systems   Physical Exam Triage Vital Signs ED Triage Vitals  Enc Vitals Group     BP 09/03/16 2150 115/84     Pulse Rate 09/03/16 2150 (!) 112     Resp 09/03/16 2150 16     Temp 09/03/16 2150 98.6 F (37 C)     Temp Source 09/03/16 2150 Oral     SpO2 09/03/16 2150 100 %     Weight 09/03/16 2148 290 lb (131.5 kg)     Height 09/03/16 2148 6\' 1"  (1.854 m)     Head Circumference --  Peak Flow --      Pain Score 09/03/16 2149 5     Pain Loc --      Pain Edu? --      Excl. in GC? --    No data found.   Updated Vital Signs BP 115/84 (BP Location: Left Arm)   Pulse (!) 112   Temp 98.6 F (37 C) (Oral)   Resp 16   Ht 6\' 1"  (1.854 m)   Wt 290 lb (131.5 kg)   SpO2 100%   BMI 38.26 kg/m   Visual Acuity Right Eye Distance:   Left Eye Distance:   Bilateral Distance:    Right Eye Near:   Left Eye Near:    Bilateral Near:     Physical Exam  Constitutional: He appears well-developed and well-nourished. No distress.  Skin: He is not diaphoretic. There is erythema.  4x7cm area of skin warmth, blanchable erythema, and tenderness to palpation on left lower leg with small amount of purulent drainage in central area   Nursing note and vitals reviewed.    UC Treatments / Results  Labs (all labs ordered are listed, but only abnormal results are displayed) Labs  Reviewed  AEROBIC CULTURE (SUPERFICIAL SPECIMEN)    EKG  EKG Interpretation None       Radiology No results found.  Procedures Procedures (including critical care time)  Medications Ordered in UC Medications  sulfamethoxazole-trimethoprim (BACTRIM DS,SEPTRA DS) 800-160 MG per tablet 1 tablet (1 tablet Oral Given 09/03/16 2239)  ceFAZolin (ANCEF) injection 1 g (1 g Intramuscular Given 09/03/16 2239)     Initial Impression / Assessment and Plan / UC Course  I have reviewed the triage vital signs and the nursing notes.  Pertinent labs & imaging results that were available during my care of the patient were reviewed by me and considered in my medical decision making (see chart for details).       Final Clinical Impressions(s) / UC Diagnoses   Final diagnoses:  Abscess of left leg  Cellulitis of leg, left    New Prescriptions New Prescriptions   SULFAMETHOXAZOLE-TRIMETHOPRIM (BACTRIM DS,SEPTRA DS) 800-160 MG TABLET    Take 1 tablet by mouth 2 (two) times daily.   1. diagnosis reviewed with patient 2. Patient given Ancef 1gm im x 1 and bactrim ds 1 tab po x1 3. rx as per orders above; reviewed possible side effects, interactions, risks and benefits  3. Recommend supportive treatment with warm compresses to area 4. Follow-up prn if symptoms worsen or don't improve   Payton Mccallum, MD 09/03/16 2254

## 2016-09-03 NOTE — ED Triage Notes (Signed)
Patient complains of boil on left lower leg boil. Patient states that he noticed on Friday. Patient states that he was seen by an urgent care in [place] on Sunday. Patient states that he was given a rocephin shot and given Keflex 500mg  QID x 10 days. Patient states that since Sunday redness has spread and area has become more painful. Patient states that pain is worse when standing.

## 2016-09-06 LAB — AEROBIC CULTURE  (SUPERFICIAL SPECIMEN): SPECIAL REQUESTS: NORMAL

## 2016-09-06 LAB — AEROBIC CULTURE W GRAM STAIN (SUPERFICIAL SPECIMEN)

## 2016-09-08 ENCOUNTER — Ambulatory Visit
Admission: EM | Admit: 2016-09-08 | Discharge: 2016-09-08 | Disposition: A | Discharge status: Home / Self Care | Payer: BLUE CROSS/BLUE SHIELD | Attending: Internal Medicine | Admitting: Internal Medicine

## 2016-09-08 DIAGNOSIS — L02416 Cutaneous abscess of left lower limb: Secondary | ICD-10-CM

## 2016-09-08 DIAGNOSIS — L03116 Cellulitis of left lower limb: Secondary | ICD-10-CM | POA: Diagnosis not present

## 2016-09-08 DIAGNOSIS — L0291 Cutaneous abscess, unspecified: Secondary | ICD-10-CM

## 2016-09-08 MED ORDER — CLINDAMYCIN HCL 150 MG PO CAPS: 450.0000 mg | ORAL_CAPSULE | Freq: Three times a day (TID) | ORAL | 0 refills | Status: DC

## 2016-09-08 MED ORDER — MUPIROCIN 2 % EX OINT: 1.0000 "application " | TOPICAL_OINTMENT | Freq: Three times a day (TID) | CUTANEOUS | 0 refills | Status: DC

## 2016-09-08 MED ORDER — CEFTRIAXONE SODIUM 1 G IJ SOLR
1.0000 g | Freq: Once | INTRAMUSCULAR | Status: AC
  Administered 2016-09-08: 1 g via INTRAMUSCULAR

## 2016-09-08 NOTE — Discharge Instructions (Signed)
Apply warm compresses as described and instructed 4 times daily for 10 minutes each time. Dried thoroughly and apply Bactroban to all areas but not directly on the wound. Elevate your leg is much as possible. Return to our clinic in 2 days for repacking. Stop taking the Septra and change now to clindamycin

## 2016-09-08 NOTE — ED Provider Notes (Signed)
CSN: [id number]     Arrival date & time 09/08/16  4098 History   First MD Initiated Contact with Patient 09/08/16 1009     Chief Complaint  Patient presents with  . 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 Was seen initially at [facility] on 09/01/2016. At that visit he was given a injection of 7 placed on Keflex and started on warm compresses. He states that he was unable to elevate his leg very much and can only do the warm compresses twice a day due to his work schedule. He is on his feet almost all day long. That seems be improving so he came to our center on Tuesday 5 days prior to this visit. At that time his medications were changed to Septra because of culture results. Patient states he is taking all his medications but despite this he is still having swelling and no real improvement in his leg. He denies any fever or chills.       Past Medical History:  Diagnosis Date  . Atrial fibrillation (HCC)   . Hypertension   . MS (multiple sclerosis) (HCC)   . Vision abnormalities    Past Surgical History:  Procedure Laterality Date  . CERVICAL FUSION  2005, 2014   Family History  Problem Relation Age of Onset  . Lupus Mother   . Healthy Father   . Lymphoma Maternal Grandfather   . Colon cancer Paternal Grandmother    Social History  Substance Use Topics  . Smoking status: Former Smoker    Quit date: 2003  . Smokeless tobacco: Never Used  . Alcohol use No    Review of Systems  Constitutional: Positive for activity change. Negative for chills, fatigue and fever.  Skin: Positive for wound.  All other systems reviewed and are negative.   Allergies  Patient has no known allergies.  Home Medications   Prior to Admission medications   Medication Sig Start Date End  Date Taking? Authorizing Provider  cyclobenzaprine (FLEXERIL) 5 MG tablet Take 1 tablet (5 mg total) by mouth every 8 (eight) hours as needed for muscle spasms. 08/27/16  Yes Sater, Pearletha Furl, MD  Dimethyl Fumarate 240 MG CPDR Take by mouth.   Yes [provider]  etodolac (LODINE) 400 MG tablet Take 1 tablet (400 mg total) by mouth 2 (two) times daily. 08/27/16  Yes Sater, Pearletha Furl, MD  lansoprazole (PREVACID) 30 MG capsule Take 20 mg by mouth daily at 12 noon.   Yes [provider]  metoprolol (LOPRESSOR) 50 MG tablet Take 50 mg by mouth 2 (two) times daily.   Yes [provider]  omeprazole (PRILOSEC) 20 MG capsule Take 20 mg by mouth daily.   Yes [provider]  sertraline (ZOLOFT) 100 MG tablet Take 100 mg by mouth daily.   Yes [provider]  clindamycin (CLEOCIN) 150 MG capsule Take 3 capsules (450 mg total) by mouth 3 (three) times daily. 09/08/16   Lutricia Feil, PA-C  mupirocin ointment (BACTROBAN) 2 % Apply 1 application topically 3 (three) times daily. 09/08/16   Lutricia Feil, PA-C   Meds Ordered and Administered this Visit   Medications  cefTRIAXone (ROCEPHIN) injection 1 g (1 g Intramuscular Given 09/08/16 1044)    BP 124/79 (BP Location: Left Arm)   Pulse 83   Temp 98.4 F (36.9  C) (Oral)   Resp 16   Ht 6\' 1"  (1.854 m)   Wt 290 lb (131.5 kg)   SpO2 98%   BMI 38.26 kg/m  No data found.   Physical Exam  Constitutional: He is oriented to person, place, and time. He appears well-developed and well-nourished. No distress.  HENT:  Head: Normocephalic.  Eyes: Pupils are equal, round, and reactive to light.  Neck: Normal range of motion.  Musculoskeletal: Normal range of motion. He exhibits edema and tenderness.  Neurological: He is alert and oriented to person, place, and time.  Skin: Skin is warm and dry. He is not diaphoretic. There is erythema.  Psychiatric: He has a normal mood and affect. His behavior is normal.  Judgment and thought content normal.  Nursing note and vitals reviewed.   Urgent Care Course     .Marland KitchenIncision and Drainage Date/Time: 09/08/2016 12:38 PM Performed by: Lutricia Feil Authorized by: Eustace Moore   Consent:    Consent obtained:  Verbal   Consent given by:  Patient   Risks discussed:  Incomplete drainage and pain   Alternatives discussed:  Alternative treatment Location:    Type:  Abscess   Location:  Lower extremity   Lower extremity location:  Leg   Leg location:  L lower leg Pre-procedure details:    Skin preparation:  Chloraprep Anesthesia (see MAR for exact dosages):    Anesthesia method:  Local infiltration   Local anesthetic:  Lidocaine 1% w/o epi Procedure type:    Complexity:  Complex Procedure details:    Needle aspiration: no     Incision types:  Single straight   Incision depth:  Subcutaneous   Scalpel blade:  11   Wound management:  Probed and deloculated, irrigated with saline and extensive cleaning   Drainage:  Purulent and serosanguinous   Drainage amount:  Scant   Wound treatment:  Drain placed   Packing materials:  1/4 in gauze Post-procedure details:    Patient tolerance of procedure:  Tolerated well, no immediate complications   (including critical care time)  Labs Review Labs Reviewed - No data to display  Imaging Review No results found.   Visual Acuity Review  Right Eye Distance:   Left Eye Distance:   Bilateral Distance:    Right Eye Near:   Left Eye Near:    Bilateral Near:     Medications  cefTRIAXone (ROCEPHIN) injection 1 g (1 g Intramuscular Given 09/08/16 1044)      MDM   1. Abscess   2. Cellulitis of left lower extremity    Discharge Medication List as of 09/08/2016 10:50 AM    START taking these medications   Details  clindamycin (CLEOCIN) 150 MG capsule Take 3 capsules (450 mg total) by mouth 3 (three) times daily., Starting Sun 09/08/2016, Normal    mupirocin ointment (BACTROBAN) 2 % Apply 1  application topically 3 (three) times daily., Starting Sun 09/08/2016, Normal      Plan: 1. Test/x-ray results and diagnosis reviewed with patient 2. rx as per orders; risks, benefits, potential side effects reviewed with patient 3. Recommend supportive treatment with Elevation above her heart today. Stressed the use of warm compresses 4 times daily and the proper way to perform them and applying Bactroban afterwards. Him follow-up in 2 days for repacking and assessing progression of the infection. 4. F/u 2 days     Lutricia Feil, PA-C 09/08/16 1244

## 2016-09-08 NOTE — ED Triage Notes (Signed)
As per patient was here Tuesday and infection was resulted as MRSA not getting improved got shot and was Rx two different antibiotics not helping his infection.

## 2016-09-10 ENCOUNTER — Ambulatory Visit (INDEPENDENT_AMBULATORY_CARE_PROVIDER_SITE_OTHER): Payer: BLUE CROSS/BLUE SHIELD

## 2016-09-10 ENCOUNTER — Ambulatory Visit
Admission: EM | Admit: 2016-09-10 | Discharge: 2016-09-10 | Disposition: A | Discharge status: Home / Self Care | Payer: BLUE CROSS/BLUE SHIELD | Attending: Family Medicine | Admitting: Family Medicine

## 2016-09-10 DIAGNOSIS — L03116 Cellulitis of left lower limb: Secondary | ICD-10-CM | POA: Diagnosis not present

## 2016-09-10 DIAGNOSIS — L0291 Cutaneous abscess, unspecified: Secondary | ICD-10-CM

## 2016-09-10 DIAGNOSIS — Z5189 Encounter for other specified aftercare: Secondary | ICD-10-CM

## 2016-09-10 DIAGNOSIS — L02416 Cutaneous abscess of left lower limb: Secondary | ICD-10-CM

## 2016-09-10 NOTE — ED Provider Notes (Signed)
CSN: [id number]     Arrival date & time 09/10/16  4098 History   First MD Initiated Contact with Patient 09/10/16 [phone]     Chief Complaint  Patient presents with  . Abscess   (Consider location/radiation/quality/duration/timing/severity/associated sxs/prior Treatment) HPI  41 year old male returns today following being seen on Sunday 2 days prior to this visit. An I&D was performed on his left lower leg over a chronic abscess. The amount of purulence was drained and a drain was placed to the depths of the wound. Because of his culture results and the lower MIC the patient was switched from Septra to clindamycin. He was also told to continue with his warm compresses and continue elevating and apply Bactroban ointment to the area after the warm compresses. He returns today. He remains afebrile. He has stayed at home elevating his leg he tells me a week long. The wound does appear improved.         Past Medical History:  Diagnosis Date  . Atrial fibrillation (HCC)   . Hypertension   . MS (multiple sclerosis) (HCC)   . Vision abnormalities    Past Surgical History:  Procedure Laterality Date  . CERVICAL FUSION  2005, 2014   Family History  Problem Relation Age of Onset  . Lupus Mother   . Healthy Father   . Lymphoma Maternal Grandfather   . Colon cancer Paternal Grandmother    Social History  Substance Use Topics  . Smoking status: Former Smoker    Quit date: 2003  . Smokeless tobacco: Never Used  . Alcohol use No    Review of Systems  Constitutional: Positive for activity change. Negative for chills, fatigue and fever.  Skin: Positive for wound.  All other systems reviewed and are negative.   Allergies  Patient has no known allergies.  Home Medications   Prior to Admission medications   Medication Sig Start Date End Date Taking? Authorizing Provider  clindamycin (CLEOCIN) 150 MG capsule Take 3 capsules (450 mg total) by mouth 3 (three) times daily. 09/08/16  Yes  Lutricia Feil, PA-C  cyclobenzaprine (FLEXERIL) 5 MG tablet Take 1 tablet (5 mg total) by mouth every 8 (eight) hours as needed for muscle spasms. 08/27/16  Yes Sater, Pearletha Furl, MD  Dimethyl Fumarate 240 MG CPDR Take by mouth.   Yes [provider]  etodolac (LODINE) 400 MG tablet Take 1 tablet (400 mg total) by mouth 2 (two) times daily. 08/27/16  Yes Sater, Pearletha Furl, MD  lansoprazole (PREVACID) 30 MG capsule Take 20 mg by mouth daily at 12 noon.   Yes [provider]  metoprolol (LOPRESSOR) 50 MG tablet Take 50 mg by mouth 2 (two) times daily.   Yes [provider]  mupirocin ointment (BACTROBAN) 2 % Apply 1 application topically 3 (three) times daily. 09/08/16  Yes Lutricia Feil, PA-C  omeprazole (PRILOSEC) 20 MG capsule Take 20 mg by mouth daily.   Yes [provider]  sertraline (ZOLOFT) 100 MG tablet Take 100 mg by mouth daily.   Yes [provider]   Meds Ordered and Administered this Visit  Medications - No data to display  BP 133/89 (BP Location: Left Arm)   Pulse 86   Temp 98.3 F (36.8 C) (Oral)   Resp 18   Ht 6\' 1"  (1.854 m)   Wt 290 lb (131.5 kg)   SpO2 99%   BMI 38.26 kg/m  No data found.   Physical Exam  Constitutional: He is  oriented to person, place, and time. He appears well-developed and well-nourished. No distress.  HENT:  Head: Normocephalic.  Eyes: Pupils are equal, round, and reactive to light.  Neck: Normal range of motion.  Musculoskeletal: Normal range of motion. He exhibits edema and tenderness.  Neurological: He is alert and oriented to person, place, and time.  Skin: Skin is warm and dry. He is not diaphoretic. There is erythema.  Psychiatric: He has a normal mood and affect. His behavior is normal. Judgment and thought content normal.  Nursing note and vitals reviewed.       Urgent Care Course     Procedures (including critical care time)  Labs Review Labs Reviewed - No data to  display  Imaging Review Dg Tibia/fibula Left  Result Date: 09/10/2016 CLINICAL DATA:  Redness and swelling. EXAM: LEFT TIBIA AND FIBULA - 2 VIEW COMPARISON:  None FINDINGS: There is no evidence of fracture or other focal bone lesions. Soft tissues are unremarkable. IMPRESSION: Negative. Electronically Signed   By: Signa Kell M.D.   On: 09/10/2016 09:22     Visual Acuity Review  Right Eye Distance:   Left Eye Distance:   Bilateral Distance:    Right Eye Near:   Left Eye Near:    Bilateral Near:        Procedure: The skin was prepped with chlorhexidine solution. The drain was removed. Serosanguineous fluid continued to drain from the wound. There is no purulence however. The wound had coalesced greatly and is looking much better. Ther wound was repacked loosely with another drain  Of quarter-inch gauze. A dry sterile dressing was then applied over the wound. The patient will continue with his previous care plan. He will follow-up in 2 days for removal of the drain and most likely at that time not having to require another drain can continue a warm compress regimen as he has been doing.     MDM   1. Abscess   2. Cellulitis of left lower extremity    Plan: 1. Test/x-ray results and diagnosis reviewed with patient 2. rx as per orders; risks, benefits, potential side effects reviewed with patient 3. Recommend supportive treatment with Continued warm compresses  4 times daily application of Bactroban and completion of the antibiotics. We'll plan on removing the drain in 2 days and likely not having to replace it. And then continue on with the warm compresses Bactroban until completion and the wound has healed. I have told the patient that if there is any question about the healing that we could consider sending him to wound care center. He will return to work tomorrow without restrictions. 4. F/u prn if symptoms worsen or don't improve     Lutricia Feil, PA-C 09/10/16  1011

## 2016-09-10 NOTE — ED Triage Notes (Signed)
Patient complains of abscess to left lower leg. Patient states that he still feels like area is not improving.

## 2016-09-13 ENCOUNTER — Encounter: Payer: Self-pay | Admitting: *Deleted

## 2016-09-13 ENCOUNTER — Ambulatory Visit
Admission: EM | Admit: 2016-09-13 | Discharge: 2016-09-13 | Disposition: A | Discharge status: Home / Self Care | Payer: BLUE CROSS/BLUE SHIELD | Attending: Emergency Medicine | Admitting: Emergency Medicine

## 2016-09-13 DIAGNOSIS — Z5189 Encounter for other specified aftercare: Secondary | ICD-10-CM

## 2016-09-13 NOTE — ED Triage Notes (Signed)
Here for follow up wound check to left lower leg.

## 2016-09-13 NOTE — ED Provider Notes (Signed)
CSN: [id number]     Arrival date & time 09/13/16  0813 History   None    Chief Complaint  Patient presents with  . Wound Check   (Consider location/radiation/quality/duration/timing/severity/associated sxs/prior Treatment) HPI  Mr. Sean Espinoza returns today saying that he is noticed an improvement in his leg. The abscess appears to have decreased in size and intensity. He is continuing to use warm compresses 4 times daily followed by application of Bactroban ointment. Afebrile.       Past Medical History:  Diagnosis Date  . Atrial fibrillation (HCC)   . Hypertension   . MS (multiple sclerosis) (HCC)   . Vision abnormalities    Past Surgical History:  Procedure Laterality Date  . CERVICAL FUSION  2005, 2014   Family History  Problem Relation Age of Onset  . Lupus Mother   . Healthy Father   . Lymphoma Maternal Grandfather   . Colon cancer Paternal Grandmother    Social History  Substance Use Topics  . Smoking status: Former Smoker    Quit date: 2003  . Smokeless tobacco: Never Used  . Alcohol use No    Review of Systems  Constitutional: Negative for activity change, chills, fatigue and fever.  Skin: Positive for wound.  All other systems reviewed and are negative.   Allergies  Patient has no known allergies.  Home Medications   Prior to Admission medications   Medication Sig Start Date End Date Taking? Authorizing Provider  clindamycin (CLEOCIN) 150 MG capsule Take 3 capsules (450 mg total) by mouth 3 (three) times daily. 09/08/16  Yes Lutricia Feil, PA-C  cyclobenzaprine (FLEXERIL) 5 MG tablet Take 1 tablet (5 mg total) by mouth every 8 (eight) hours as needed for muscle spasms. 08/27/16  Yes Sater, Pearletha Furl, MD  lansoprazole (PREVACID) 30 MG capsule Take 20 mg by mouth daily at 12 noon.   Yes [provider]  metoprolol (LOPRESSOR) 50 MG tablet Take 50 mg by mouth 2 (two) times daily.   Yes [provider]  mupirocin ointment (BACTROBAN)  2 % Apply 1 application topically 3 (three) times daily. 09/08/16  Yes Lutricia Feil, PA-C  omeprazole (PRILOSEC) 20 MG capsule Take 20 mg by mouth daily.   Yes [provider]  sertraline (ZOLOFT) 100 MG tablet Take 100 mg by mouth daily.   Yes [provider]  Dimethyl Fumarate 240 MG CPDR Take by mouth.    [provider]  etodolac (LODINE) 400 MG tablet Take 1 tablet (400 mg total) by mouth 2 (two) times daily. 08/27/16   Sater, Pearletha Furl, MD   Meds Ordered and Administered this Visit  Medications - No data to display  BP 120/85 (BP Location: Left Arm)   Pulse 74   Temp 98.6 F (37 C) (Oral)   Resp 16   Ht 6\' 1"  (1.854 m)   Wt 273 lb (123.8 kg)   SpO2 99%   BMI 36.02 kg/m  No data found.   Physical Exam  Constitutional: He is oriented to person, place, and time. He appears well-developed and well-nourished. No distress.  HENT:  Head: Normocephalic.  Eyes: Pupils are equal, round, and reactive to light.  Neck: Normal range of motion.  Musculoskeletal: Normal range of motion.  Neurological: He is alert and oriented to person, place, and time.  Skin: Skin is warm and dry. He is not diaphoretic.  Psychiatric: He has a normal mood and affect. His behavior is normal. Judgment and thought content normal.  Nursing note and vitals reviewed.     Urgent Care Course     Procedures (including critical care time)  Labs Review Labs Reviewed - No data to display  Imaging Review No results found.   Visual Acuity Review  Right Eye Distance:   Left Eye Distance:   Bilateral Distance:    Right Eye Near:   Left Eye Near:    Bilateral Near:         MDM   1. Wound check, abscess    I told patient that his wound should continue to improve but he still must remain aggressive with 3-4 times daily warm compresses applying Bactroban and continue taking the oral medications until completion. If he notices that it is worsening /lags in improvement  is also given the phone number and address of the [place] wound care center. He  May always return to our clinic also. I will not schedule him for follow-up.    Lutricia Feil, PA-C 09/13/16 [phone]

## 2016-10-21 ENCOUNTER — Telehealth: Payer: Self-pay | Admitting: Neurology

## 2016-10-21 NOTE — Telephone Encounter (Signed)
Pt is having concerning cognitive memory issues that he would like a call back to discuss, please call

## 2016-10-21 NOTE — Telephone Encounter (Signed)
I have spoken with Sean Espinoza this morning.  He c/o increased difficulty with memory over the last few weeks (for example can't remember what exit to take when driving.) I moved his Oct. f/u to 11/04/16/fim

## 2016-11-04 ENCOUNTER — Encounter: Payer: Self-pay | Admitting: Neurology

## 2016-11-04 ENCOUNTER — Ambulatory Visit (INDEPENDENT_AMBULATORY_CARE_PROVIDER_SITE_OTHER): Payer: BLUE CROSS/BLUE SHIELD | Admitting: Neurology

## 2016-11-04 VITALS — BP 138/96 | HR 70 | Ht 73.0 in | Wt 296.5 lb

## 2016-11-04 DIAGNOSIS — G35 Multiple sclerosis: Secondary | ICD-10-CM | POA: Diagnosis not present

## 2016-11-04 DIAGNOSIS — H469 Unspecified optic neuritis: Secondary | ICD-10-CM

## 2016-11-04 DIAGNOSIS — R4789 Other speech disturbances: Secondary | ICD-10-CM

## 2016-11-04 DIAGNOSIS — M5432 Sciatica, left side: Secondary | ICD-10-CM | POA: Diagnosis not present

## 2016-11-04 DIAGNOSIS — R269 Unspecified abnormalities of gait and mobility: Secondary | ICD-10-CM | POA: Diagnosis not present

## 2016-11-04 DIAGNOSIS — R200 Anesthesia of skin: Secondary | ICD-10-CM

## 2016-11-04 DIAGNOSIS — F988 Other specified behavioral and emotional disorders with onset usually occurring in childhood and adolescence: Secondary | ICD-10-CM

## 2016-11-04 DIAGNOSIS — G35D Multiple sclerosis, unspecified: Secondary | ICD-10-CM

## 2016-11-04 MED ORDER — AMPHETAMINE-DEXTROAMPHET ER 20 MG PO CP24: 20.0000 mg | ORAL_CAPSULE | Freq: Every day | ORAL | 0 refills | Status: DC

## 2016-11-04 NOTE — Progress Notes (Addendum)
[facility]  PATIENT: Sean Espinoza DOB: [birth year]  REFERRING DOCTOR OR PCP:  Referred by Dr. Thad Ranger SOURCE: paitent, ED notes, imaging reports, lab reports, MRI images on PACS  _________________________________   HISTORICAL  CHIEF COMPLAINT:  Chief Complaint  Patient presents with  . New Patient (Initial Visit)    RM 4, alone.  About 2-3 weeks ago, in a meeting, could not get words out. This was the first episode. Forgets where he is going while driving. This is a new sx.   . Memory Loss    Today's MMSE 29/30.     HISTORY OF PgRESENT ILLNESS:  Terrian Sentell is a 41 yo man diagnosed with multiple sclerosis diagnosed in early 2018.  He is noting more cognitive issues.   MS:    He is tolerating Tecfidera ok.  No mor eflushing    He denies any significant GI issues.   He has GERD and that is unchanged.    No new exacerbation  Cognition:    A couple weeks ago, while on a teleconference, he was having a lot of difficulties getting the right words out. He felt he was speaking gibberish.   He was getting words out but they were jumbled and out of order.  After a few minutes the call was stopped and he feels this went on x 10 minutes.  He notes losing his train of thought in conversation.   MMSE today is 29/30.    He understood everything and he thought he was talking normally but his supervisor noted the changes.    He has noted decreased focus and attention.  He has had this for a while but it seems worse,   He notes missing turns and driving past where he was supposed to go a few times.     Left Sciatica:   He is noting continued lower back and leg pain.  Pain is worse first thing in the morning and is better after he's walked some. Pain started May 2018.   He notes some numbness and tingling in the left leg.   He feels weight bearing is worse on that leg.   He has no h/o lumbar spine issues.   He only notes mild buttock pain when sitting.   However, pain goes  down the left leg when sitting on the left cheek.    A TPI at the last visit only helped x 1 day only.    MS:   He is tolerating Tecfidera ok.  He has had some flushing.    He denies any significant GI issues.   He has GERD and that is unchanged.    No new exacerbation  Gait/strength/sensation   He reports that his gait is doing well. He he denies any stumbling or falls. He notes no major difficulty with strength or sensation in the arms or legs.   The numbness that he was having on the left side has resolved.  Bladder: He denies any significant problem with bladder urgency or frequency or hesitancy. He does not have any incontinence.  Vision: He denies any difficulty with visual acuity, diplopia or eye pain.  Fatigue/sleep:  He notes mild fatigue, physical more than cognitive. Fatigue is worse with heat.[name]  He is sleeping worse since the leg pain started.   .  Mood:    He notes more irritability but no change in depression.   He is on Zoloft.  X 6 years, more for anxiety than depression.  MS History:   In mid March, he had the onset of blurry vision out of the left eye. He noted that the visual field was worse on the left side than the right side. Additionally he was unable to read out of the left eye. Colors were mildly desaturated. On April 2, he had the onset of left facial numbness. Over the next day the numbness increased to involve the entire left side, worse in the arm than the leg. He also noted mild weakness in the left arm and reduced balance and gait.[name] He presented to [facility] and an MRI 07/02/2016 showed many T2/FLAIR hyperintense foci, four foci  enhanced  (right frontal subcortical white matter, right corpus callosum, left parietal subcortical white matter and left mesial temporal lobe), consistent with the diagnosis of MS. He was admitted and received 3 days of IV Solu-Medrol. His symptoms improved some and he was discharged.  He was referred to me and Gilford Raid was started in  March 2018.     REVIEW OF SYSTEMS: Constitutional: No fevers, chills, sweats, or change in appetite.   Notes fatigue. Eyes: No visual changes, double vision, eye pain Ear, nose and throat: No hearing loss, ear pain, nasal congestion, sore throat Cardiovascular: No chest pain, palpitations Respiratory: No shortness of breath at rest or with exertion.   No wheezes GastrointestinaI: No nausea, vomiting, diarrhea, abdominal pain, fecal incontinence Genitourinary: No dysuria, urinary retention or frequency.  No nocturia. Musculoskeletal: No neck pain, back pain Integumentary: No rash, pruritus, skin lesions Neurological: as above Psychiatric: No depression at this time.  Mild anxiety Endocrine: No palpitations, diaphoresis, change in appetite, change in weigh or increased thirst Hematologic/Lymphatic: No anemia, purpura, petechiae. Allergic/Immunologic: No itchy/runny eyes, nasal congestion, recent allergic reactions, rashes  ALLERGIES: No Known Allergies  HOME MEDICATIONS:  Current Outpatient Prescriptions:  .  cyclobenzaprine (FLEXERIL) 5 MG tablet, Take 1 tablet (5 mg total) by mouth every 8 (eight) hours as needed for muscle spasms., Disp: 90 tablet, Rfl: 1 .  Dimethyl Fumarate 240 MG CPDR, Take by mouth., Disp: , Rfl:  .  etodolac (LODINE) 400 MG tablet, Take 1 tablet (400 mg total) by mouth 2 (two) times daily., Disp: 60 tablet, Rfl: 1 .  lansoprazole (PREVACID) 30 MG capsule, Take 20 mg by mouth daily at 12 noon., Disp: , Rfl:  .  metoprolol (LOPRESSOR) 50 MG tablet, Take 50 mg by mouth 2 (two) times daily., Disp: , Rfl:  .  omeprazole (PRILOSEC) 20 MG capsule, Take 20 mg by mouth daily., Disp: , Rfl:  .  sertraline (ZOLOFT) 100 MG tablet, Take 100 mg by mouth daily., Disp: , Rfl:  .  amphetamine-dextroamphetamine (ADDERALL XR) 20 MG 24 hr capsule, Take 1 capsule (20 mg total) by mouth daily., Disp: 30 capsule, Rfl: 0 No current facility-administered medications for this visit.     Facility-Administered Medications Ordered in Other Visits:  .  gadopentetate dimeglumine (MAGNEVIST) injection 20 mL, 20 mL, Intravenous, Once PRN, Sater, Pearletha Furl, MD  PAST MEDICAL HISTORY: Past Medical History:  Diagnosis Date  . Atrial fibrillation (HCC)   . Hypertension   . MS (multiple sclerosis) (HCC)   . Vision abnormalities     PAST SURGICAL HISTORY: Past Surgical History:  Procedure Laterality Date  . CERVICAL FUSION  2005, 2014    FAMILY HISTORY: Family History  Problem Relation Age of Onset  . Lupus Mother   . Healthy Father   . Lymphoma Maternal Grandfather   . Colon cancer Paternal  Grandmother     SOCIAL HISTORY:  Social History   Social History  . Marital status: Married    Spouse name: N/A  . Number of children: N/A  . Years of education: N/A   Occupational History  . Not on file.   Social History Main Topics  . Smoking status: Former Smoker    Quit date: 2003  . Smokeless tobacco: Never Used  . Alcohol use No  . Drug use: No  . Sexual activity: Not on file   Other Topics Concern  . Not on file   Social History Narrative  . No narrative on file     PHYSICAL EXAM  Vitals:   11/04/16 1423  BP: (!) 138/96  Pulse: 70  Weight: 296 lb 8 oz (134.5 kg)  Height: 6\' 1"  (1.854 m)    Body mass index is 39.12 kg/m.   General: The patient is well-developed and well-nourished and in no acute distress  Musculoskeletal: He is moderately tender over the left piriformis muscle and mildly tender over the lower lumbar paraspinal muscles.   Neurologic Exam  Mental status: He scored 29/30 on the MMSE, losing one point on recall. With a hint, he was 30/30.  The patient is alert and oriented x 3 at the time of the examination. The patient has apparent normal recent and remote memory, with an apparently normal attention span and concentration ability.   Speech is normal.  Cranial nerves: Extraocular movements are full. The pupils show a 1+ left  APD.    Colors are desaturated out of the left eye.   Facial strength and sensation is normal.  Trapezius and sternocleidomastoid strength is normal. No dysarthria is noted.  The tongue is midline, and the patient has symmetric elevation of the soft palate. No obvious hearing deficits are noted.  Motor:  Muscle bulk is normal.   Tone is normal. Strength is  5 / 5 in all 4 extremities.   Sensory: Sensory testing is intact to pinprick and temperature and touch in all 4 extremities.  He has reduced vibration sensation in the left arm and left leg.   Coordination: Cerebellar testing reveals good finger-nose-finger and heel-to-shin bilaterally.  Gait and station: Station is normal.   Gait is mildly wide . There is no foot drop. Tandem walk is very wide. Romberg is negative.   Reflexes: Deep tendon reflexes are symmetric and increased at the knees with spread. Deep tendon reflexes are increased at the ankles without clonus.       DIAGNOSTIC DATA (LABS, IMAGING, TESTING) - I reviewed patient records, labs, notes, testing and imaging myself where available.  Lab Results  Component Value Date   WBC 10.8 (H) 07/01/2016   HGB 15.7 07/01/2016   HCT 47.1 07/01/2016   MCV 87.9 07/01/2016   PLT 267 07/01/2016      Component Value Date/Time   NA 140 07/01/2016 2010   K 3.5 07/01/2016 2010   CL 104 07/01/2016 2010   CO2 27 07/01/2016 2010   GLUCOSE 102 (H) 07/01/2016 2010   BUN 15 07/01/2016 2010   CREATININE 1.11 07/01/2016 2010   CALCIUM 9.3 07/01/2016 2010   PROT 7.3 07/17/2016 1625   ALBUMIN 4.8 07/17/2016 1625   AST 14 07/17/2016 1625   ALT 17 07/17/2016 1625   ALKPHOS 61 07/17/2016 1625   BILITOT 0.4 07/17/2016 1625   GFRNONAA >60 07/01/2016 2010   GFRAA >60 07/01/2016 2010   Lab Results  Component Value Date   CHOL  197 07/02/2016   HDL 29 (L) 07/02/2016   LDLCALC 127 (H) 07/02/2016   TRIG 206 (H) 07/02/2016   CHOLHDL 6.8 07/02/2016   Lab Results  Component Value Date    HGBA1C 5.1 07/02/2016        ASSESSMENT AND PLAN  Multiple sclerosis (HCC) - Plan: CANCELED: MR LUMBAR SPINE WO CONTRAST  Left optic neuritis  Sciatica, left side - Plan: MR LUMBAR SPINE WO CONTRAST, CANCELED: MR LUMBAR SPINE WO CONTRAST  Gait disturbance - Plan: MR LUMBAR SPINE WO CONTRAST  Verbal fluency disorder  Numbness  Attention deficit disorder (ADD) in adult   1.   Continue Tecfidera.  At about the time of the next visit, we will check an MRI of the brain to make sure that there is not any subclinical progression. If present, consider a change to a different medication. 2.   The back and left leg pain have persisted despite conservative measures (NSAIDs, muscle relaxants, trigger point injections, exercises).   We will check an MRI of the lumbar spine and consider epidural steroid injection or referral to surgery if significant findings are noted. 3.    The episode of verbal disfluency could be related to decreased focus and attention. I think an exacerbation is unlikely.  4.   He is having difficulty with attention and focus. I will add Adderall XR.[name] 5.    Return for regular visit in 4 months but sooner if there are new or worsening symptoms.    Richard A. Epimenio Foot, MD, PhD 11/06/2016, 5:15 PM Dir., [facility]  Certified in Neurology, Clinical Neurophysiology, Sleep Medicine, Pain Medicine and Neuroimaging   [facility] [address] [place], [place] [address] [phone]

## 2016-11-05 ENCOUNTER — Telehealth: Payer: Self-pay | Admitting: Neurology

## 2016-11-05 NOTE — Telephone Encounter (Signed)
I started the case for the MRI Lumbar but they informed me it did not meet the guidelines. I then spoke to a [employer] nurse and told her the patient has MS and left sided sciatica she also informed me it does not meet the guidelines and that it is pending a peer to peer. The phone number for the peer to peer is [phone] and the member ID is [id number] & DOB is 1975-06-20. The case does close in 2 business days.

## 2016-11-05 NOTE — Telephone Encounter (Signed)
I did the P2P     Authorization #  [id number]    Expires   12/04/16

## 2016-11-06 ENCOUNTER — Telehealth: Payer: Self-pay | Admitting: Neurology

## 2016-11-06 DIAGNOSIS — F988 Other specified behavioral and emotional disorders with onset usually occurring in childhood and adolescence: Secondary | ICD-10-CM | POA: Insufficient documentation

## 2016-11-06 NOTE — Telephone Encounter (Signed)
Patient states [facility] needs reason for patient to take amphetamine-dextroamphetamine (ADDERALL XR) 20 MG 24 hr capsule.

## 2016-11-06 NOTE — Telephone Encounter (Signed)
Noted, thank you for your help!  °

## 2016-11-07 NOTE — Telephone Encounter (Signed)
RN call patient about [facility] needs diagnose code for adderall medication.Pt stated they need a diagnose code for the meds. Rn will [facility].

## 2016-11-07 NOTE — Telephone Encounter (Signed)
Rn call patient that PA was approve and to get medication tomorrow at [facility]. Rn stated if there are any issues he can call us.

## 2016-11-07 NOTE — Telephone Encounter (Signed)
[facility]n [facility] for [facility] at [phone]. RN stated pt has diagnose of ADD. PA was approve via phone of authorization number of [id number] to expire 11/08/2019.

## 2016-11-12 NOTE — Telephone Encounter (Signed)
Fax received from [facility].  Adderall SR approved for dates 11/07/16 thru 11/08/19.  PA# [employer] ([employer] [phone] and [address]) [id number] ML/fim

## 2016-11-25 ENCOUNTER — Ambulatory Visit
Admission: RE | Admit: 2016-11-25 | Discharge: 2016-11-25 | Disposition: A | Discharge status: Home / Self Care | Payer: BLUE CROSS/BLUE SHIELD | Source: Ambulatory Visit | Attending: Neurology | Admitting: Neurology

## 2016-11-25 DIAGNOSIS — M5432 Sciatica, left side: Secondary | ICD-10-CM

## 2016-11-25 DIAGNOSIS — R269 Unspecified abnormalities of gait and mobility: Secondary | ICD-10-CM

## 2016-11-28 ENCOUNTER — Telehealth: Payer: Self-pay | Admitting: *Deleted

## 2016-11-28 NOTE — Telephone Encounter (Signed)
-----   Message from Asa Lente, MD sent at 11/27/2016  4:04 PM EDT ----- Please let the patient notes that the MRI of the lumbar spine shows mild disc degenerative changes in the bottom 3 levels but there is nerve root compression

## 2016-11-28 NOTE — Telephone Encounter (Signed)
I have spoken with Sean Espinoza this afternoon and per RAS, reviewed MRI results as below.  He verbalized understanding of same/fim

## 2016-12-07 ENCOUNTER — Encounter: Payer: Self-pay | Admitting: Neurology

## 2016-12-07 ENCOUNTER — Other Ambulatory Visit: Payer: Self-pay | Admitting: Neurology

## 2016-12-08 ENCOUNTER — Ambulatory Visit
Admission: EM | Admit: 2016-12-08 | Discharge: 2016-12-08 | Disposition: A | Discharge status: Home / Self Care | Payer: BLUE CROSS/BLUE SHIELD | Attending: Family Medicine | Admitting: Family Medicine

## 2016-12-08 DIAGNOSIS — L02412 Cutaneous abscess of left axilla: Secondary | ICD-10-CM

## 2016-12-08 MED ORDER — CLINDAMYCIN HCL 300 MG PO CAPS: 300.0000 mg | ORAL_CAPSULE | Freq: Three times a day (TID) | ORAL | 0 refills | Status: AC

## 2016-12-08 MED ORDER — MUPIROCIN CALCIUM 2 % EX CREA: 1.0000 "application " | TOPICAL_CREAM | Freq: Two times a day (BID) | CUTANEOUS | 0 refills | Status: DC

## 2016-12-08 NOTE — Discharge Instructions (Signed)
Recommend start Clindamycin 300mg  three times a day as directed. May also apply Bactroban ointment to area. Apply warm compresses to area 2 to 3 times a day as directed. Follow-up in 5 days if not resolving.

## 2016-12-08 NOTE — ED Triage Notes (Signed)
Pt reports knot under left armpit which he noticed 3-4 days ago. Area is reddened, and has not drained anything. Pain 4/10

## 2016-12-09 ENCOUNTER — Telehealth: Payer: Self-pay | Admitting: *Deleted

## 2016-12-09 MED ORDER — AMPHETAMINE-DEXTROAMPHET ER 20 MG PO CP24: 20.0000 mg | ORAL_CAPSULE | Freq: Every day | ORAL | 0 refills | Status: DC

## 2016-12-09 NOTE — Telephone Encounter (Signed)
Adderall rx. up front [facility] per emailed request/fim 
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
## 2016-12-30 ENCOUNTER — Ambulatory Visit: Payer: BLUE CROSS/BLUE SHIELD | Admitting: Neurology

## 2017-01-09 ENCOUNTER — Encounter: Payer: Self-pay | Admitting: Neurology

## 2017-01-13 ENCOUNTER — Telehealth: Payer: Self-pay | Admitting: *Deleted

## 2017-01-13 MED ORDER — AMPHETAMINE-DEXTROAMPHET ER 20 MG PO CP24: 20.0000 mg | ORAL_CAPSULE | Freq: Every day | ORAL | 0 refills | Status: DC

## 2017-01-13 NOTE — Telephone Encounter (Signed)
Adderall rx. up front [facility] per emailed request/fim 

## 2017-01-20 ENCOUNTER — Emergency Department
Admission: EM | Admit: 2017-01-20 | Discharge: 2017-01-20 | Disposition: A | Discharge status: Home / Self Care | Payer: BLUE CROSS/BLUE SHIELD | Attending: Emergency Medicine | Admitting: Emergency Medicine

## 2017-01-20 ENCOUNTER — Emergency Department: Payer: BLUE CROSS/BLUE SHIELD

## 2017-01-20 DIAGNOSIS — Z87891 Personal history of nicotine dependence: Secondary | ICD-10-CM | POA: Diagnosis not present

## 2017-01-20 DIAGNOSIS — I10 Essential (primary) hypertension: Secondary | ICD-10-CM | POA: Insufficient documentation

## 2017-01-20 DIAGNOSIS — Z79899 Other long term (current) drug therapy: Secondary | ICD-10-CM | POA: Diagnosis not present

## 2017-01-20 DIAGNOSIS — N200 Calculus of kidney: Secondary | ICD-10-CM

## 2017-01-20 DIAGNOSIS — R1011 Right upper quadrant pain: Secondary | ICD-10-CM | POA: Diagnosis present

## 2017-01-20 LAB — URINALYSIS, COMPLETE (UACMP) WITH MICROSCOPIC
Bacteria, UA: NONE SEEN
Bilirubin Urine: NEGATIVE
GLUCOSE, UA: NEGATIVE mg/dL
Ketones, ur: NEGATIVE mg/dL
Leukocytes, UA: NEGATIVE
NITRITE: NEGATIVE
PH: 5 (ref 5.0–8.0)
Protein, ur: 30 mg/dL — AB
SPECIFIC GRAVITY, URINE: 1.028 (ref 1.005–1.030)

## 2017-01-20 LAB — CBC
HEMATOCRIT: 42 % (ref 40.0–52.0)
HEMOGLOBIN: 14.7 g/dL (ref 13.0–18.0)
MCH: 30 pg (ref 26.0–34.0)
MCHC: 35.1 g/dL (ref 32.0–36.0)
MCV: 85.5 fL (ref 80.0–100.0)
Platelets: 257 10*3/uL (ref 150–440)
RBC: 4.91 MIL/uL (ref 4.40–5.90)
RDW: 13.3 % (ref 11.5–14.5)
WBC: 8 10*3/uL (ref 3.8–10.6)

## 2017-01-20 LAB — BASIC METABOLIC PANEL
Anion gap: 14 (ref 5–15)
BUN: 15 mg/dL (ref 6–20)
CO2: 21 mmol/L — ABNORMAL LOW (ref 22–32)
Calcium: 9.1 mg/dL (ref 8.9–10.3)
Chloride: 106 mmol/L (ref 101–111)
Creatinine, Ser: 1.13 mg/dL (ref 0.61–1.24)
GFR calc Af Amer: >60 mL/min (ref >60)
GFR calc non Af Amer: >60 mL/min (ref >60)
GLUCOSE: 163 mg/dL — AB (ref 65–99)
POTASSIUM: 3.2 mmol/L — AB (ref 3.5–5.1)
Sodium: 141 mmol/L (ref 135–145)

## 2017-01-20 MED ORDER — TAMSULOSIN HCL 0.4 MG PO CAPS: 0.4000 mg | ORAL_CAPSULE | Freq: Every day | ORAL | 0 refills | Status: DC

## 2017-01-20 MED ORDER — OXYCODONE-ACETAMINOPHEN 5-325 MG PO TABS
2.0000 | ORAL_TABLET | Freq: Once | ORAL | Status: AC
  Administered 2017-01-20: 2 via ORAL
  Filled 2017-01-20: qty 2

## 2017-01-20 MED ORDER — SODIUM CHLORIDE 0.9 % IV BOLUS (SEPSIS)
1000.0000 mL | Freq: Once | INTRAVENOUS | Status: AC
  Administered 2017-01-20: 1000 mL via INTRAVENOUS

## 2017-01-20 MED ORDER — OXYCODONE-ACETAMINOPHEN 5-325 MG PO TABS: 1.0000 | ORAL_TABLET | Freq: Four times a day (QID) | ORAL | 0 refills | Status: DC | PRN

## 2017-01-20 MED ORDER — OXYCODONE-ACETAMINOPHEN 5-325 MG PO TABS: 2.0000 | ORAL_TABLET | Freq: Once | ORAL | Status: DC

## 2017-01-20 MED ORDER — ONDANSETRON HCL 4 MG/2ML IJ SOLN
4.0000 mg | Freq: Once | INTRAMUSCULAR | Status: AC
  Administered 2017-01-20: 4 mg via INTRAVENOUS
  Filled 2017-01-20: qty 2

## 2017-01-20 MED ORDER — ONDANSETRON 4 MG PO TBDP: 4.0000 mg | ORAL_TABLET | Freq: Three times a day (TID) | ORAL | 0 refills | Status: DC | PRN

## 2017-01-20 MED ORDER — KETOROLAC TROMETHAMINE 30 MG/ML IJ SOLN
30.0000 mg | Freq: Once | INTRAMUSCULAR | Status: AC
  Administered 2017-01-20: 30 mg via INTRAVENOUS
  Filled 2017-01-20: qty 1

## 2017-01-20 MED ORDER — MORPHINE SULFATE (PF) 4 MG/ML IV SOLN
4.0000 mg | Freq: Once | INTRAVENOUS | Status: AC
  Administered 2017-01-20: 4 mg via INTRAVENOUS
  Filled 2017-01-20: qty 1

## 2017-01-20 NOTE — ED Notes (Signed)
ED Provider at bedside. 

## 2017-01-20 NOTE — ED Provider Notes (Signed)
[facility] Emergency Department Provider Note   ____________________________________________   First MD Initiated Contact with Patient 01/20/17 [phone]     (approximate)  I have reviewed the triage vital signs and the nursing notes.   HISTORY  Chief Complaint Flank Pain    HPI Sean Espinoza is a 41 y.o. male Who comes into the hospital today with some right flank pain. He reports symptoms started at 2 AM. The patient has a history of kidney stones in the past. He is unsure if it feels like the previous stone but he reports it is severe. He has been unable to urinate so he is unsure if it has any blood in it or if it's burning. The patient has some nausea and vomiting. His pain is a 10 out of 10. He states that he's never hurt so bad in his life. He is here today for evaluation.the patient denies that the pain wraps around his abdomen.   Past Medical History:  Diagnosis Date  . Atrial fibrillation (HCC)   . Hypertension   . MS (multiple sclerosis) (HCC)   . Vision abnormalities     Patient Active Problem List   Diagnosis Date Noted  . Attention deficit disorder (ADD) in adult 11/06/2016  . 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 Sciatica, left side 08/27/2016  . Multiple sclerosis (HCC) 07/17/2016  . Gait disturbance 07/17/2016  . Left optic neuritis 07/17/2016  . Numbness 07/17/2016  . Blurred vision, left eye   . TIA (transient ischemic attack) 07/01/2016  . Weakness 07/01/2016    Past Surgical History:  Procedure Laterality Date  . CERVICAL FUSION  2005, 2014    Prior to Admission medications   Medication Sig Start Date End Date Taking? Authorizing Provider  amphetamine-dextroamphetamine (ADDERALL XR) 20 MG 24 hr capsule Take 1 capsule (20 mg total) by mouth daily. 01/13/17   Sater, Pearletha Furl, MD  Dimethyl Fumarate 240 MG CPDR Take by mouth.    [provider]  metoprolol (LOPRESSOR) 50 MG tablet Take 50 mg by mouth 2 (two)  times daily.    [provider]  mupirocin cream (BACTROBAN) 2 % Apply 1 application topically 2 (two) times daily. To affected area 12/08/16   Sudie Grumbling, NP  omeprazole (PRILOSEC) 20 MG capsule Take 20 mg by mouth daily.    [provider]  ondansetron (ZOFRAN ODT) 4 MG disintegrating tablet Take 1 tablet (4 mg total) by mouth every 8 (eight) hours as needed for nausea or vomiting. 01/20/17   Rebecka Apley, MD  oxyCODONE-acetaminophen (ROXICET) 5-325 MG tablet Take 1 tablet by mouth every 6 (six) hours as needed. 01/20/17   Rebecka Apley, MD  sertraline (ZOLOFT) 100 MG tablet Take 100 mg by mouth daily.    [provider]  tamsulosin (FLOMAX) 0.4 MG CAPS capsule Take 1 capsule (0.4 mg total) by mouth daily. 01/20/17   Rebecka Apley, MD    Allergies Patient has no known allergies.  Family History  Problem Relation Age of Onset  . Lupus Mother   . Healthy Father   . Lymphoma Maternal Grandfather   . Colon cancer Paternal Grandmother     Social History Social History  Substance Use Topics  . Smoking status: Former Smoker    Quit date: 2003  . Smokeless tobacco: Never Used  . Alcohol use Yes     Comment: social    Review of Systems  Constitutional: No fever/chills Eyes: No visual changes.  ENT: No sore throat. Cardiovascular: Denies chest pain. Respiratory: Denies shortness of breath. Gastrointestinal: nausea and vomiting with No abdominal pain.  No diarrhea.  No constipation. Genitourinary: Negative for dysuria. Musculoskeletal: back pain. Skin: Negative for rash. Neurological: Negative for headaches, focal weakness or numbness.   ____________________________________________   PHYSICAL EXAM:  VITAL SIGNS: ED Triage Vitals [01/20/17 0236]  Enc Vitals Group     BP 116/77     Pulse Rate 73     Resp 20     Temp 98.1 F (36.7 C)     Temp Source Oral     SpO2 98 %     Weight 290 lb (131.5 kg)     Height 6\' 1"  (1.854 m)       Head Circumference      Peak Flow      Pain Score 10     Pain Loc      Pain Edu?      Excl. in GC?     Constitutional: Alert and oriented. Well appearing and in severe distress. Eyes: Conjunctivae are normal. PERRL. EOMI. Head: Atraumatic. Nose: No congestion/rhinnorhea. Mouth/Throat: Mucous membranes are moist.  Oropharynx non-erythematous. Cardiovascular: Normal rate, regular rhythm. Grossly normal heart sounds.  Good peripheral circulation. Respiratory: Normal respiratory effort.  No retractions. Lungs CTAB. Gastrointestinal: Soft and nontender. No distention. positive bowel sounds with some right-sided CVA tenderness to palpation Musculoskeletal: No lower extremity tenderness nor edema.   Neurologic:  Normal speech and language.  Skin:  Skin is warm, dry and intact.  Psychiatric: Mood and affect are normal.   ____________________________________________   LABS (all labs ordered are listed, but only abnormal results are displayed)  Labs Reviewed  URINALYSIS, COMPLETE (UACMP) WITH MICROSCOPIC - Abnormal; Notable for the following:       Result Value   Color, Urine YELLOW (*)    APPearance HAZY (*)    Hgb urine dipstick MODERATE (*)    Protein, ur 30 (*)    Squamous Epithelial / LPF 0-5 (*)    All other components within normal limits  BASIC METABOLIC PANEL - Abnormal; Notable for the following:    Potassium 3.2 (*)    CO2 21 (*)    Glucose, Bld 163 (*)    All other components within normal limits  CBC   ____________________________________________  EKG  none ____________________________________________  RADIOLOGY  Ct Renal Stone Study  Result Date: 01/20/2017 CLINICAL DATA:  41 y/o  M; right flank pain. EXAM: CT ABDOMEN AND PELVIS WITHOUT CONTRAST TECHNIQUE: Multidetector CT imaging of the abdomen and pelvis was performed following the standard protocol without IV contrast. COMPARISON:  None. FINDINGS: Lower chest: No acute abnormality. Hepatobiliary: No  focal liver abnormality is seen. Cholelithiasis. No gallbladder inflammatory changes identified. No biliary ductal dilatation. Pancreas: Unremarkable. No pancreatic ductal dilatation or surrounding inflammatory changes. Spleen: Normal in size without focal abnormality. Adrenals/Urinary Tract: Normal adrenal glands. Mild right hydronephrosis and perinephric stranding secondary to a 3 mm stone at the right ureterovesicular junction. Left kidney interpolar punctate nephrolithiasis. Normal bladder. Stomach/Bowel: Stomach is within normal limits. Appendix appears normal. No evidence of bowel wall thickening, distention, or inflammatory changes. Vascular/Lymphatic: No significant vascular findings are present. No enlarged abdominal or pelvic lymph nodes. Reproductive: Prostate is unremarkable. Other: No abdominal wall hernia or abnormality. No abdominopelvic ascites. Musculoskeletal: No acute or significant osseous findings. IMPRESSION: 1. Right ureterovesicular junction 3 mm nephrolithiasis with mild right hydronephrosis and perinephric stranding. 2. Cholelithiasis. Electronically Signed   By: Micah NoelLance  [place]-Stratton M.D.   On: 01/20/2017 05:01    ____________________________________________   PROCEDURES  Procedure(s) performed: None  Procedures  Critical Care performed: No  ____________________________________________   INITIAL IMPRESSION / ASSESSMENT AND PLAN / ED COURSE  As part of my medical decision making, I reviewed the following data within the electronic MEDICAL RECORD NUMBER Notes from prior ED visits and [place] Controlled Substance Database   this is a 40 year old who comes into the hospital today with some right-sided flank pain. The patient was very uncomfortable at his arrival.  My differential diagnosis includes musculoskeletal pain, pyelonephritis, kidney stone.  I did give the patient a dose of morphine and Zofran on his arrival given his discomfort. The patient also received a liter  of normal saline. We checked some blood work which was unremarkable but the patient also received a CT scan of his kidneys. the patient's CT shows a 3 mm right UVJ stone with some mild right Hydronephrosis. The patient's urine does not show any signs of infection. The patient received a second dose of morphine and Zofran as well as some Toradol. He was resting comfortably on my repeat evaluation. He'll be discharged home and encouraged to follow-up with urology for further evaluation of his kidney stone.      ____________________________________________   FINAL CLINICAL IMPRESSION(S) / ED DIAGNOSES  Final diagnoses:  Kidney stone      NEW MEDICATIONS STARTED DURING THIS VISIT:  New Prescriptions   ONDANSETRON (ZOFRAN ODT) 4 MG DISINTEGRATING TABLET    Take 1 tablet (4 mg total) by mouth every 8 (eight) hours as needed for nausea or vomiting.   OXYCODONE-ACETAMINOPHEN (ROXICET) 5-325 MG TABLET    Take 1 tablet by mouth every 6 (six) hours as needed.   TAMSULOSIN (FLOMAX) 0.4 MG CAPS CAPSULE    Take 1 capsule (0.4 mg total) by mouth daily.     Note:  This document was prepared using Dragon voice recognition software and may include unintentional dictation errors.    Rebecka Apley, MD 01/20/17 [phone]

## 2017-01-20 NOTE — Discharge Instructions (Signed)
Please follow-up with urology for further evaluation of your symptoms. °

## 2017-01-20 NOTE — ED Triage Notes (Signed)
Pt awakened approx 15 min ago with rt flank pain, pt reports hx of kidney stone 12 years ago, but this pain is much worse, pt is pale and clammy in triage

## 2017-01-21 ENCOUNTER — Encounter: Payer: Self-pay | Admitting: Urology

## 2017-01-21 ENCOUNTER — Ambulatory Visit (INDEPENDENT_AMBULATORY_CARE_PROVIDER_SITE_OTHER): Payer: BLUE CROSS/BLUE SHIELD | Admitting: Urology

## 2017-01-21 VITALS — BP 116/78 | HR 86 | Ht 73.0 in | Wt 288.4 lb

## 2017-01-21 DIAGNOSIS — N132 Hydronephrosis with renal and ureteral calculous obstruction: Secondary | ICD-10-CM | POA: Diagnosis not present

## 2017-01-21 DIAGNOSIS — N201 Calculus of ureter: Secondary | ICD-10-CM

## 2017-01-21 DIAGNOSIS — R3129 Other microscopic hematuria: Secondary | ICD-10-CM

## 2017-01-21 DIAGNOSIS — R5383 Other fatigue: Secondary | ICD-10-CM | POA: Diagnosis not present

## 2017-01-21 LAB — URINALYSIS, COMPLETE
BILIRUBIN UA: NEGATIVE
Glucose, UA: NEGATIVE
Leukocytes, UA: NEGATIVE
NITRITE UA: NEGATIVE
PH UA: 6 (ref 5.0–7.5)
SPEC GRAV UA: 1.015 (ref 1.005–1.030)
Urobilinogen, Ur: 0.2 mg/dL (ref 0.2–1.0)

## 2017-01-21 LAB — MICROSCOPIC EXAMINATION
Bacteria, UA: NONE SEEN
EPITHELIAL CELLS (NON RENAL): NONE SEEN /HPF (ref 0–10)
WBC UA: NONE SEEN /HPF (ref 0–?)

## 2017-01-21 MED ORDER — PROMETHAZINE HCL 25 MG PO TABS: 25.0000 mg | ORAL_TABLET | Freq: Four times a day (QID) | ORAL | 0 refills | Status: DC | PRN

## 2017-01-21 NOTE — Progress Notes (Signed)
01/21/2017 11:21 AM   Sean Espinoza 24-Aug-1975 [id number]  Referring provider: No referring provider defined for this encounter.  Chief Complaint  Patient presents with  . New Patient (Initial Visit)    Kidney stone referred by ER    HPI: Patient is a 41 year old Caucasian male who is referred by [facility] ED for nephrolithiasis.  Patient states the onset of the pain was one day ago.   It was sharp.  It lasted for several hours.  The pain was located in the right flank.  The pain was a 10/10.  Nothing made the pain better.   Nothing made the pain worse.  He did have nausea and vomiting.    In the ED,  he received Toradol, morphine, Zofran, Percocet and tamsulosin.   UA was positive for 6-30 RBCs per prior field.  Serum creatinine 1.13.  Prior serum creatinine 1.11 six months ago.  WBC count of 8.0.    CT Renal stone study noted right ureterovesicular junction 3 mm nephrolithiasis with mild right hydronephrosis and perinephric stranding.  Cholelithiasis.  Today, he is having minimal urinary output.  He has not had any gross hematuria.  Right flank pain that radiates to the right groin area.  7-8/10 pain.  He has been nauseated with vomiting.  He has not had fevers or chills.  UA is positive for 11-30 RBC's.    He does have a prior history of stones.    He attributes this current stone episode to drinking large amounts of monster energy drinks. He states that due to the MS he does not have energy and was started on Adderall. He states the Adderall was effective at first in decreasing his fatigue, but he states the effects have worn off.  He has not had a testosterone level checked.      PMH: Past Medical History:  Diagnosis Date  . Atrial fibrillation ([place])   . Hypertension   . MS (multiple sclerosis) ([place])   . Vision abnormalities     Surgical History: Past Surgical History:  Procedure Laterality Date  . CERVICAL FUSION  2005, 2014    Home Medications:  Allergies  as of 01/21/2017   No Known Allergies     Medication List       Accurate as of 01/21/17 11:21 AM. Always use your most recent med list.          amphetamine-dextroamphetamine 20 MG 24 hr capsule Commonly known as:  ADDERALL XR Take 1 capsule (20 mg total) by mouth daily.   cyclobenzaprine 5 MG tablet Commonly known as:  FLEXERIL Take by mouth.   Dimethyl Fumarate 240 MG Cpdr Take 240 mg by mouth 2 (two) times daily.   etodolac 400 MG tablet Commonly known as:  LODINE Take by mouth.   metoprolol succinate 50 MG 24 hr tablet Commonly known as:  TOPROL-XL Take 50 mg by mouth.   metoprolol tartrate 50 MG tablet Commonly known as:  LOPRESSOR Take 50 mg by mouth 2 (two) times daily.   mupirocin cream 2 % Commonly known as:  BACTROBAN Apply 1 application topically 2 (two) times daily. To affected area   omeprazole 20 MG capsule Commonly known as:  PRILOSEC Take 20 mg by mouth daily.   ondansetron 4 MG disintegrating tablet Commonly known as:  ZOFRAN ODT Take 1 tablet (4 mg total) by mouth every 8 (eight) hours as needed for nausea or vomiting.   oxyCODONE-acetaminophen 5-325 MG tablet Commonly known as:  ROXICET Take 1 tablet by mouth every 6 (six) hours as needed.   promethazine 25 MG tablet Commonly known as:  PHENERGAN Take 1 tablet (25 mg total) by mouth every 6 (six) hours as needed for nausea or vomiting.   sertraline 100 MG tablet Commonly known as:  ZOLOFT Take 100 mg by mouth daily.   tamsulosin 0.4 MG Caps capsule Commonly known as:  FLOMAX Take 1 capsule (0.4 mg total) by mouth daily.       Allergies: No Known Allergies  Family History: Family History  Problem Relation Age of Onset  . Lupus Mother   . Healthy Father   . Lymphoma Maternal Grandfather   . Colon cancer Paternal Grandmother   . Kidney cancer Neg Hx   . Bladder Cancer Neg Hx   . Prostate cancer Neg Hx     Social History:  reports that he quit smoking about 15 years ago.  He has never used smokeless tobacco. He reports that he drinks alcohol. He reports that he does not use drugs.  ROS: UROLOGY Frequent Urination?: No Hard to postpone urination?: No Burning/pain with urination?: No Get up at night to urinate?: No Leakage of urine?: No Urine stream starts and stops?: No Trouble starting stream?: No Do you have to strain to urinate?: No Blood in urine?: Yes Urinary tract infection?: No Sexually transmitted disease?: No Injury to kidneys or bladder?: No Painful intercourse?: No Weak stream?: No Erection problems?: No Penile pain?: No  Gastrointestinal Nausea?: Yes Vomiting?: Yes Indigestion/heartburn?: Yes Diarrhea?: No Constipation?: No  Constitutional Fever: No Night sweats?: No Weight loss?: No Fatigue?: No  Skin Skin rash/lesions?: No Itching?: No  Eyes Blurred vision?: No Double vision?: No  Ears/Nose/Throat Sore throat?: No Sinus problems?: No  Hematologic/Lymphatic Swollen glands?: No Easy bruising?: No  Cardiovascular Leg swelling?: No Chest pain?: No  Respiratory Cough?: Yes Shortness of breath?: No  Endocrine Excessive thirst?: Yes  Musculoskeletal Back pain?: Yes Joint pain?: Yes  Neurological Headaches?: Yes Dizziness?: Yes  Psychologic Depression?: Yes Anxiety?: Yes  Physical Exam: BP 116/78   Pulse 86   Ht _0  (1.854 m)   Wt 288 lb 6.4 oz (130.8 kg)   BMI 38.05 kg/m   Constitutional: Well nourished. Alert and oriented, No acute distress. HEENT: [place] AT, moist mucus membranes. Trachea midline, no masses. Cardiovascular: No clubbing, cyanosis, or edema. Respiratory: Normal respiratory effort, no increased work of breathing. GI: Abdomen is soft, non tender, non distended, no abdominal masses. Liver and spleen not palpable.  No hernias appreciated.  Stool sample for occult testing is not indicated.   GU: No CVA tenderness.  No bladder fullness or masses.   Skin: No rashes, bruises or  suspicious lesions. Lymph: No cervical or inguinal adenopathy. Neurologic: Grossly intact, no focal deficits, moving all 4 extremities. Psychiatric: Normal mood and affect.  Laboratory Data: Lab Results  Component Value Date   WBC 8.0 01/20/2017   HGB 14.7 01/20/2017   HCT 42.0 01/20/2017   MCV 85.5 01/20/2017   PLT 257 01/20/2017    Lab Results  Component Value Date   CREATININE 1.13 01/20/2017    Lab Results  Component Value Date   HGBA1C 5.1 07/02/2016       Component Value Date/Time   CHOL 197 07/02/2016 0353   HDL 29 (L) 07/02/2016 0353   CHOLHDL 6.8 07/02/2016 0353   VLDL 41 (H) 07/02/2016 0353   LDLCALC 127 (H) 07/02/2016 0353    Lab Results  Component Value  Date   AST 14 07/17/2016   Lab Results  Component Value Date   ALT 17 07/17/2016    Urinalysis 11-30 RBC's.  See EPIC  I have reviewed the labs.   Pertinent Imaging: CLINICAL DATA:  41 y/o  M; right flank pain.  EXAM: CT ABDOMEN AND PELVIS WITHOUT CONTRAST  TECHNIQUE: Multidetector CT imaging of the abdomen and pelvis was performed following the standard protocol without IV contrast.  COMPARISON:  None.  FINDINGS: Lower chest: No acute abnormality.  Hepatobiliary: No focal liver abnormality is seen. Cholelithiasis. No gallbladder inflammatory changes identified. No biliary ductal dilatation.  Pancreas: Unremarkable. No pancreatic ductal dilatation or surrounding inflammatory changes.  Spleen: Normal in size without focal abnormality.  Adrenals/Urinary Tract: Normal adrenal glands. Mild right hydronephrosis and perinephric stranding secondary to a 3 mm stone at the right ureterovesicular junction. Left kidney interpolar punctate nephrolithiasis. Normal bladder.  Stomach/Bowel: Stomach is within normal limits. Appendix appears normal. No evidence of bowel wall thickening, distention, or inflammatory changes.  Vascular/Lymphatic: No significant vascular findings are  present. No enlarged abdominal or pelvic lymph nodes.  Reproductive: Prostate is unremarkable.  Other: No abdominal wall hernia or abnormality. No abdominopelvic ascites.  Musculoskeletal: No acute or significant osseous findings.  IMPRESSION: 1. Right ureterovesicular junction 3 mm nephrolithiasis with mild right hydronephrosis and perinephric stranding. 2. Cholelithiasis.   Electronically Signed   By: Kristine Garbe M.D.   On: 01/20/2017 05:01 I have independently reviewed the films.    Assessment & Plan:    1. Right ureteral stone  - explained to the patient that AUA Guidelines for patients with uncomplicated ureteral stones ?10 mm should be offered observation, and those with distal stones of similar size should be offered MET with ?-blockers  - if after 4 to 6 weeks observation with or without MET is not successful we would pursue URS or ESWL, if the patient/clinician decide to intervene sooner based on a shared decision making approach, the clinicians should offer definitive stone treatment  -URS would be the first lined therapy if stone(s) do not pass, but ESWL is the procedure with the least morbidity and lowest complication rate, but URS has a greater stone-free rate in a single procedure  - Advised to contact our office or seek treatment in the ED if becomes febrile or pain/ vomiting are difficult control in order to arrange for emergent/urgent intervention  - RTC in one week for KUB and UA  2. Right hydronephrosis  - obtain RUS to ensure the hydronephrosis has resolved once the stone has passed   3. Microscopic hematuria  - UA today demonstrates 11-30 RBC's  - continue to monitor the patient's UA after the treatment/passage of the stone to ensure the hematuria has resolved  - if hematuria persists, we will pursue a hematuria workup with CT Urogram and cystoscopy if appropriate.  4. Fatigue  - Check serum testosterone level today    Return in about  1 week (around 01/28/2017) for KUB and UA .  These notes generated with voice recognition software. I apologize for typographical errors.  Zara Council, [facility] [address], [place], [place] [address] [phone]

## 2017-01-22 LAB — TESTOSTERONE: TESTOSTERONE: 90 ng/dL — AB (ref 264–916)

## 2017-01-23 ENCOUNTER — Telehealth: Payer: Self-pay | Admitting: *Deleted

## 2017-01-23 NOTE — Telephone Encounter (Signed)
-----   Message from Harle Battiest, PA-C sent at 01/22/2017  8:21 AM EDT ----- Please let Ashlin know that his testosterone is low.  We will need to morning specimens drawn before 9 AM 2 days apart for confirmation.

## 2017-01-23 NOTE — Telephone Encounter (Signed)
Spoke with patient and gave message. Patient ok with the plan and he was transferred to [facility] to make the lab appointments.

## 2017-01-23 NOTE — Telephone Encounter (Signed)
LMOM for patient to call back about recent labs work.

## 2017-01-24 LAB — CULTURE, URINE COMPREHENSIVE

## 2017-01-27 ENCOUNTER — Other Ambulatory Visit: Payer: BLUE CROSS/BLUE SHIELD

## 2017-01-27 DIAGNOSIS — R7989 Other specified abnormal findings of blood chemistry: Secondary | ICD-10-CM

## 2017-01-27 NOTE — Progress Notes (Signed)
01/28/2017 4:29 PM   Sean Espinoza 07-Oct-1975 [id number]  Referring provider: No referring provider defined for this encounter.  Chief Complaint  Patient presents with  . Results  . Nephrolithiasis    HPI: 41 yo WM who presents today for a 2 week follow up for a right UVJ stone.    Background history Patient is a 41 year old Caucasian male who is referred by [facility] ED for nephrolithiasis.  Patient states the onset of the pain was one day ago.   It was sharp.  It lasted for several hours.  The pain was located in the right flank.  The pain was a 10/10.  Nothing made the pain better.   Nothing made the pain worse.  He did have nausea and vomiting.   In the ED,  he received Toradol, morphine, Zofran, Percocet and tamsulosin.   UA was positive for 6-30 RBCs per prior field.  Serum creatinine 1.13.  Prior serum creatinine 1.11 six months ago.  WBC count of 8.0.   CT Renal stone study noted right ureterovesicular junction 3 mm nephrolithiasis with mild right hydronephrosis and perinephric stranding.  Cholelithiasis.  He does have a prior history of stones.  He attributes this current stone episode to drinking large amounts of monster energy drinks. He states that due to the MS he does not have energy and was started on Adderall. He states the Adderall was effective at first in decreasing his fatigue, but he states the effects have worn off.  He has not had a testosterone level checked.  At the visit one week ago, he was having minimal urinary output.  He has not had any gross hematuria.  Right flank pain that radiates to the right groin area.  7-8/10 pain.  He has been nauseated with vomiting.  He has not had fevers or chills.  UA was positive for 11-30 RBC's.    His afternoon serum testosterone was found to be 90 ng/dL. He has returned for a morning testosterone draw results and it was 260 ng/dL.    His UA today is negative.   Today, he is not having flank pain.  He has not passed a  fragment. He has not had gross hematuria, dysuria suprapubic pain. He has not had fevers, chills, nausea or vomiting.        PMH: Past Medical History:  Diagnosis Date  . Atrial fibrillation ([facility])   . Hypertension   . MS (multiple sclerosis) ([facility])   . Vision abnormalities     Surgical History: Past Surgical History:  Procedure Laterality Date  . CERVICAL FUSION  2005, 2014    Home Medications:  Allergies as of 01/28/2017   No Known Allergies     Medication List       Accurate as of 01/28/17  4:29 PM. Always use your most recent med list.          amphetamine-dextroamphetamine 20 MG 24 hr capsule Commonly known as:  ADDERALL XR Take 1 capsule (20 mg total) by mouth daily.   cyclobenzaprine 5 MG tablet Commonly known as:  FLEXERIL Take by mouth.   Dimethyl Fumarate 240 MG Cpdr Take 240 mg by mouth 2 (two) times daily.   etodolac 400 MG tablet Commonly known as:  LODINE Take by mouth.   metoprolol succinate 50 MG 24 hr tablet Commonly known as:  TOPROL-XL Take 50 mg by mouth.   metoprolol tartrate 50 MG tablet Commonly known as:  LOPRESSOR Take 50 mg  by mouth 2 (two) times daily.   mupirocin cream 2 % Commonly known as:  BACTROBAN Apply 1 application topically 2 (two) times daily. To affected area   omeprazole 20 MG capsule Commonly known as:  PRILOSEC Take 20 mg by mouth daily.   ondansetron 4 MG disintegrating tablet Commonly known as:  ZOFRAN ODT Take 1 tablet (4 mg total) by mouth every 8 (eight) hours as needed for nausea or vomiting.   oxyCODONE-acetaminophen 5-325 MG tablet Commonly known as:  ROXICET Take 1 tablet by mouth every 6 (six) hours as needed.   promethazine 25 MG tablet Commonly known as:  PHENERGAN Take 1 tablet (25 mg total) by mouth every 6 (six) hours as needed for nausea or vomiting.   sertraline 100 MG tablet Commonly known as:  ZOLOFT Take 100 mg by mouth daily.   tamsulosin 0.4 MG Caps capsule Commonly known as:   FLOMAX Take 1 capsule (0.4 mg total) by mouth daily.       Allergies: No Known Allergies  Family History: Family History  Problem Relation Age of Onset  . Lupus Mother   . Healthy Father   . Lymphoma Maternal Grandfather   . Colon cancer Paternal Grandmother   . Kidney cancer Neg Hx   . Bladder Cancer Neg Hx   . Prostate cancer Neg Hx     Social History:  reports that he quit smoking about 15 years ago. He has never used smokeless tobacco. He reports that he drinks alcohol. He reports that he does not use drugs.  ROS: UROLOGY Frequent Urination?: No Hard to postpone urination?: No Burning/pain with urination?: No Get up at night to urinate?: No Leakage of urine?: No Urine stream starts and stops?: No Trouble starting stream?: No Do you have to strain to urinate?: No Blood in urine?: No Urinary tract infection?: No Sexually transmitted disease?: No Injury to kidneys or bladder?: No Painful intercourse?: No Weak stream?: No Erection problems?: No Penile pain?: No  Gastrointestinal Nausea?: No Vomiting?: No Indigestion/heartburn?: No Diarrhea?: No Constipation?: No  Constitutional Fever: No Night sweats?: No Weight loss?: No Fatigue?: Yes  Skin Skin rash/lesions?: No Itching?: No  Eyes Blurred vision?: No Double vision?: No  Ears/Nose/Throat Sore throat?: No Sinus problems?: No  Hematologic/Lymphatic Swollen glands?: No Easy bruising?: No  Cardiovascular Leg swelling?: No Chest pain?: No  Respiratory Cough?: Yes Shortness of breath?: No  Endocrine Excessive thirst?: No  Musculoskeletal Back pain?: No Joint pain?: No  Neurological Headaches?: No Dizziness?: No  Psychologic Depression?: Yes Anxiety?: No  Physical Exam: BP (!) 126/91   Pulse 86   Ht 6\' 1"  (1.854 m)   Wt 288 lb (130.6 kg)   BMI 38.00 kg/m   Constitutional: Well nourished. Alert and oriented, No acute distress. HEENT: [place] AT, moist mucus membranes. Trachea  midline, no masses. Cardiovascular: No clubbing, cyanosis, or edema. Respiratory: Normal respiratory effort, no increased work of breathing. GI: Abdomen is soft, non tender, non distended, no abdominal masses. Liver and spleen not palpable.  No hernias appreciated.  Stool sample for occult testing is not indicated.   GU: No CVA tenderness.  No bladder fullness or masses.   Skin: No rashes, bruises or suspicious lesions. Lymph: No cervical or inguinal adenopathy. Neurologic: Grossly intact, no focal deficits, moving all 4 extremities. Psychiatric: Normal mood and affect.  Laboratory Data: Lab Results  Component Value Date   WBC 8.0 01/20/2017   HGB 14.7 01/20/2017   HCT 42.0 01/20/2017   MCV  85.5 01/20/2017   PLT 257 01/20/2017    Lab Results  Component Value Date   CREATININE 1.13 01/20/2017    Lab Results  Component Value Date   HGBA1C 5.1 07/02/2016       Component Value Date/Time   CHOL 197 07/02/2016 0353   HDL 29 (L) 07/02/2016 0353   CHOLHDL 6.8 07/02/2016 0353   VLDL 41 (H) 07/02/2016 0353   LDLCALC 127 (H) 07/02/2016 0353    Lab Results  Component Value Date   AST 14 07/17/2016   Lab Results  Component Value Date   ALT 17 07/17/2016    Urinalysis Negative.  See EPIC  I have reviewed the labs.   Assessment & Plan:    1. Right ureteral stone  - patient feels he has passed the stone  - Advised to contact our office or seek treatment in the ED if becomes febrile or pain/ vomiting are difficult control in order to arrange for emergent/urgent intervention  -  Will need a RUS  2. Right hydronephrosis  - obtain RUS to ensure the hydronephrosis has resolved  3. Microscopic hematuria  - UA today is negative   4. Fatigue  - First morning testosterone is below 300 - will return for second morning testosterone on Thursday  Return for I will call patient with results.  These notes generated with voice recognition software. I apologize for  typographical errors.  Michiel Cowboy, PA-C  [facility] [address] [place], [place] [address] [phone]

## 2017-01-28 ENCOUNTER — Ambulatory Visit (INDEPENDENT_AMBULATORY_CARE_PROVIDER_SITE_OTHER): Payer: BLUE CROSS/BLUE SHIELD | Admitting: Urology

## 2017-01-28 ENCOUNTER — Encounter: Payer: Self-pay | Admitting: Urology

## 2017-01-28 VITALS — BP 126/91 | HR 86 | Ht 73.0 in | Wt 288.0 lb

## 2017-01-28 DIAGNOSIS — R3129 Other microscopic hematuria: Secondary | ICD-10-CM

## 2017-01-28 DIAGNOSIS — N132 Hydronephrosis with renal and ureteral calculous obstruction: Secondary | ICD-10-CM | POA: Diagnosis not present

## 2017-01-28 DIAGNOSIS — R5383 Other fatigue: Secondary | ICD-10-CM | POA: Diagnosis not present

## 2017-01-28 DIAGNOSIS — N201 Calculus of ureter: Secondary | ICD-10-CM | POA: Diagnosis not present

## 2017-01-28 LAB — URINALYSIS, COMPLETE
Bilirubin, UA: NEGATIVE
GLUCOSE, UA: NEGATIVE
KETONES UA: NEGATIVE
Leukocytes, UA: NEGATIVE
NITRITE UA: NEGATIVE
PROTEIN UA: NEGATIVE
RBC UA: NEGATIVE
SPEC GRAV UA: 1.015 (ref 1.005–1.030)
Urobilinogen, Ur: 0.2 mg/dL (ref 0.2–1.0)
pH, UA: 6 (ref 5.0–7.5)

## 2017-01-28 LAB — TESTOSTERONE: TESTOSTERONE: 260 ng/dL — AB (ref 264–916)

## 2017-01-30 ENCOUNTER — Other Ambulatory Visit: Payer: BLUE CROSS/BLUE SHIELD

## 2017-01-30 DIAGNOSIS — E349 Endocrine disorder, unspecified: Secondary | ICD-10-CM

## 2017-01-31 ENCOUNTER — Telehealth: Payer: Self-pay

## 2017-01-31 LAB — TESTOSTERONE: TESTOSTERONE: 203 ng/dL — AB (ref 264–916)

## 2017-01-31 NOTE — Telephone Encounter (Signed)
Done

## 2017-01-31 NOTE — Telephone Encounter (Signed)
-----   Message from Harle BattiestShannon A McGowan, PA-C sent at 01/31/2017  8:17 AM EDT ----- Please add LFT's, LH and prolactin levels to his blood work.

## 2017-02-03 ENCOUNTER — Telehealth: Payer: Self-pay

## 2017-02-03 MED ORDER — CLOMIPHENE CITRATE 50 MG PO TABS: ORAL_TABLET | ORAL | 0 refills | Status: DC

## 2017-02-03 NOTE — Telephone Encounter (Signed)
-----   Message from Harle BattiestShannon A McGowan, PA-C sent at 01/31/2017  8:17 AM EDT ----- Please add LFT's, LH and prolactin levels to his blood work.

## 2017-02-03 NOTE — Telephone Encounter (Signed)
-----  Message from Nori Riis, PA-C sent at 02/01/2017 12:46 PM EDT ----- Please let Sean Espinoza know that he has met the criteria for testosterone deficiency.   Please call in the Clomid 50 mg, 1/2 tablet daily, #30 to his pharmacy.  We will need to recheck his testosterone level before 10 am in one month.

## 2017-02-03 NOTE — Telephone Encounter (Signed)
Labs were added.

## 2017-02-03 NOTE — Telephone Encounter (Signed)
Spoke with pt in reference to testosterone results and clomid. Made pt aware will need labs 76mo after starting medication. Pt voiced understanding. Medication sent to pharmacy.

## 2017-02-05 ENCOUNTER — Ambulatory Visit
Admission: RE | Admit: 2017-02-05 | Discharge: 2017-02-05 | Disposition: A | Discharge status: Home / Self Care | Payer: BLUE CROSS/BLUE SHIELD | Source: Ambulatory Visit | Attending: Urology | Admitting: Urology

## 2017-02-05 DIAGNOSIS — R3129 Other microscopic hematuria: Secondary | ICD-10-CM

## 2017-02-05 DIAGNOSIS — N132 Hydronephrosis with renal and ureteral calculous obstruction: Secondary | ICD-10-CM | POA: Diagnosis not present

## 2017-02-05 DIAGNOSIS — N201 Calculus of ureter: Secondary | ICD-10-CM

## 2017-02-05 LAB — HEPATIC FUNCTION PANEL
ALT: 12 IU/L (ref 0–44)
AST: 8 IU/L (ref 0–40)
Albumin: 4.7 g/dL (ref 3.5–5.5)
Alkaline Phosphatase: 49 IU/L (ref 39–117)
Bilirubin Total: 0.3 mg/dL (ref 0.0–1.2)
Bilirubin, Direct: 0.08 mg/dL (ref 0.00–0.40)
TOTAL PROTEIN: 6.8 g/dL (ref 6.0–8.5)

## 2017-02-05 LAB — FSH/LH
FSH: 10.3 m[IU]/mL (ref 1.5–12.4)
LH: 4.7 m[IU]/mL (ref 1.7–8.6)

## 2017-02-05 LAB — SPECIMEN STATUS REPORT

## 2017-02-05 LAB — PROLACTIN: PROLACTIN: 7.6 ng/mL (ref 4.0–15.2)

## 2017-02-07 ENCOUNTER — Telehealth: Payer: Self-pay

## 2017-02-07 NOTE — Telephone Encounter (Signed)
-----   Message from Harle Battiest, PA-C sent at 02/06/2017 11:23 AM EST ----- Please let Amarey know that his kidney ultrasound was normal.

## 2017-02-07 NOTE — Telephone Encounter (Signed)
LMOM- RUS normal.

## 2017-02-16 ENCOUNTER — Encounter: Payer: Self-pay | Admitting: Neurology

## 2017-02-17 MED ORDER — AMPHETAMINE-DEXTROAMPHET ER 20 MG PO CP24: 20.0000 mg | ORAL_CAPSULE | Freq: Every day | ORAL | 0 refills | Status: DC

## 2017-02-17 NOTE — Telephone Encounter (Signed)
Adderall rx. up front [facility] 

## 2017-02-19 ENCOUNTER — Other Ambulatory Visit: Payer: Self-pay

## 2017-02-19 ENCOUNTER — Ambulatory Visit
Admission: EM | Admit: 2017-02-19 | Discharge: 2017-02-19 | Disposition: A | Discharge status: Home / Self Care | Payer: BLUE CROSS/BLUE SHIELD | Attending: Family Medicine | Admitting: Family Medicine

## 2017-02-19 ENCOUNTER — Encounter: Payer: Self-pay | Admitting: Emergency Medicine

## 2017-02-19 DIAGNOSIS — R69 Illness, unspecified: Secondary | ICD-10-CM | POA: Diagnosis not present

## 2017-02-19 DIAGNOSIS — J111 Influenza due to unidentified influenza virus with other respiratory manifestations: Secondary | ICD-10-CM

## 2017-02-19 NOTE — Discharge Instructions (Signed)
This is likely viral and not the flu.  Tylenol/motrin as needed.  Rest, fluids.  Take care  Dr. Adriana Simas

## 2017-02-19 NOTE — ED Triage Notes (Signed)
Patient c/o bodyaches, cough, congestion and HAs for 2 days.  Patient unsure if he has had any fevers.

## 2017-02-19 NOTE — ED Provider Notes (Signed)
[facility]    CSN: [id number] Arrival date & time: 02/19/17  1312  History   Chief Complaint Chief Complaint  Patient presents with  . Generalized Body Aches  . Cough  . Headache   HPI  41 year old male presents with the above complaints.  Patient reports that he is not felt well for the past 2 days.  Started on Monday.  Started with sinus pain, pressure, body aches, and cough.  Associated headache.  No fever.  He has been taking over-the-counter DayQuil and NyQuil without improvement.  No known exacerbating factors.  No known relieving factors.  He is concerned that he may have influenza.  No other complaints or concerns at this time.  Past Medical History:  Diagnosis Date  . Atrial fibrillation (HCC)   . Hypertension   . MS (multiple sclerosis) (HCC)   . Vision abnormalities    Patient Active Problem List   Diagnosis Date Noted  . Attention deficit disorder (ADD) in adult 11/06/2016  . 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 Sciatica, left side 08/27/2016  . Multiple sclerosis (HCC) 07/17/2016  . Gait disturbance 07/17/2016  . Left optic neuritis 07/17/2016  . Numbness 07/17/2016  . Blurred vision, left eye   . TIA (transient ischemic attack) 07/01/2016   Past Surgical History:  Procedure Laterality Date  . CERVICAL FUSION  2005, 2014    Home Medications    Prior to Admission medications   Medication Sig Start Date End Date Taking? Authorizing Provider  amphetamine-dextroamphetamine (ADDERALL XR) 20 MG 24 hr capsule Take 1 capsule (20 mg total) daily by mouth. 02/17/17  Yes Sater, Pearletha Furl, MD  clomiPHENE (CLOMID) 50 MG tablet 1/2 tab daily 02/03/17  Yes McGowan, Shannon A, PA-C  metoprolol (LOPRESSOR) 50 MG tablet Take 50 mg by mouth 2 (two) times daily.   Yes [provider]  omeprazole (PRILOSEC) 20 MG capsule Take 20 mg by mouth daily.   Yes [provider]  sertraline (ZOLOFT) 100 MG tablet Take 100 mg by mouth daily.   Yes  [provider]  Dimethyl Fumarate 240 MG CPDR Take 240 mg by mouth 2 (two) times daily.     [provider]  ondansetron (ZOFRAN ODT) 4 MG disintegrating tablet Take 1 tablet (4 mg total) by mouth every 8 (eight) hours as needed for nausea or vomiting. 01/20/17   Rebecka Apley, MD    Family History Family History  Problem Relation Age of Onset  . Lupus Mother   . Healthy Father   . Lymphoma Maternal Grandfather   . Colon cancer Paternal Grandmother   . Kidney cancer Neg Hx   . Bladder Cancer Neg Hx   . Prostate cancer Neg Hx     Social History Social History   Tobacco Use  . Smoking status: Former Smoker    Last attempt to quit: 2003    Years since quitting: 15.8  . Smokeless tobacco: Never Used  Substance Use Topics  . Alcohol use: Yes    Comment: social  . Drug use: No     Allergies   Patient has no known allergies.   Review of Systems Review of Systems  Constitutional: Negative for fever.  HENT: Positive for sinus pressure and sinus pain.   Respiratory: Positive for cough.   Neurological: Positive for headaches.   Physical Exam Triage Vital Signs ED Triage Vitals  Enc Vitals Group     BP 02/19/17 1333 110/79     Pulse Rate 02/19/17  1333 71     Resp 02/19/17 1333 16     Temp 02/19/17 1333 98.9 F (37.2 C)     Temp Source 02/19/17 1333 Oral     SpO2 02/19/17 1333 98 %     Weight 02/19/17 1329 288 lb (130.6 kg)     Height 02/19/17 1329 6\' 1"  (1.854 m)     Head Circumference --      Peak Flow --      Pain Score 02/19/17 1329 4     Pain Loc --      Pain Edu? --      Excl. in GC? --    Updated Vital Signs BP 110/79 (BP Location: Left Arm)   Pulse 71   Temp 98.9 F (37.2 C) (Oral)   Resp 16   Ht 6\' 1"  (1.854 m)   Wt 288 lb (130.6 kg)   SpO2 98%   BMI 38.00 kg/m     Physical Exam  Constitutional: He is oriented to person, place, and time. He appears well-developed. No distress.  HENT:  Head: Normocephalic.  Nose: Nose  normal.  Mouth/Throat: Oropharynx is clear and moist.  Normal TM's.  Eyes: Conjunctivae are normal.  Neck: Neck supple.  Cardiovascular: Normal rate and regular rhythm.  No murmur heard. Pulmonary/Chest: Effort normal and breath sounds normal. He has no wheezes. He has no rales.  Lymphadenopathy:    He has no cervical adenopathy.  Neurological: He is alert and oriented to person, place, and time.  Skin: Skin is warm. No rash noted.  Psychiatric: He has a normal mood and affect. His behavior is normal.  Vitals reviewed.  UC Treatments / Results  Labs (all labs ordered are listed, but only abnormal results are displayed) Labs Reviewed - No data to display  EKG  EKG Interpretation None       Radiology No results found.  Procedures Procedures (including critical care time)  Medications Ordered in UC Medications - No data to display   Initial Impression / Assessment and Plan / UC Course  I have reviewed the triage vital signs and the nursing notes.  Pertinent labs & imaging results that were available during my care of the patient were reviewed by me and considered in my medical decision making (see chart for details).     41 year old male presents with an influenza-like illness.  We discussed this at length.  I doubt that he has the flu especially in the absence of fever.  Supportive care.  Over-the-counter Tylenol/Motrin as needed.  Final Clinical Impressions(s) / UC Diagnoses   Final diagnoses:  Influenza-like illness    ED Discharge Orders    None     Controlled Substance Prescriptions [place] Controlled Substance Registry consulted? Not Applicable   Tommie SamsCook, Soni Kegel G, DO 02/19/17 1413

## 2017-03-05 ENCOUNTER — Telehealth: Payer: Self-pay | Admitting: *Deleted

## 2017-03-05 NOTE — Telephone Encounter (Signed)
LMOM.  He has an appt. with RAS Monday 03/10/17.  Due to the anticipated inclement weather on Monday, I wanted to see if he could come in at 2:30 today.  If that appt. is still available when he calls back, he is welcome to come in today/fim

## 2017-03-06 ENCOUNTER — Ambulatory Visit: Payer: BLUE CROSS/BLUE SHIELD | Admitting: Neurology

## 2017-03-09 ENCOUNTER — Telehealth: Payer: Self-pay | Admitting: *Deleted

## 2017-03-09 NOTE — Telephone Encounter (Signed)
LMTC.  Due to snow, our office will be closed on Monday, 03/09/17.  We need to r/s his appt. with RAS.Sean Espinoza

## 2017-03-10 ENCOUNTER — Ambulatory Visit: Payer: BLUE CROSS/BLUE SHIELD | Admitting: Neurology

## 2017-03-21 ENCOUNTER — Encounter: Payer: Self-pay | Admitting: *Deleted

## 2017-03-21 ENCOUNTER — Other Ambulatory Visit: Payer: Self-pay

## 2017-03-21 ENCOUNTER — Encounter: Payer: Self-pay | Admitting: Neurology

## 2017-03-21 ENCOUNTER — Ambulatory Visit (INDEPENDENT_AMBULATORY_CARE_PROVIDER_SITE_OTHER): Payer: BLUE CROSS/BLUE SHIELD | Admitting: Neurology

## 2017-03-21 VITALS — BP 125/85 | HR 67 | Resp 14 | Ht 73.0 in | Wt 287.5 lb

## 2017-03-21 DIAGNOSIS — Z79899 Other long term (current) drug therapy: Secondary | ICD-10-CM

## 2017-03-21 DIAGNOSIS — F988 Other specified behavioral and emotional disorders with onset usually occurring in childhood and adolescence: Secondary | ICD-10-CM | POA: Diagnosis not present

## 2017-03-21 DIAGNOSIS — H469 Unspecified optic neuritis: Secondary | ICD-10-CM | POA: Diagnosis not present

## 2017-03-21 DIAGNOSIS — R269 Unspecified abnormalities of gait and mobility: Secondary | ICD-10-CM

## 2017-03-21 DIAGNOSIS — H538 Other visual disturbances: Secondary | ICD-10-CM

## 2017-03-21 DIAGNOSIS — G35 Multiple sclerosis: Secondary | ICD-10-CM | POA: Diagnosis not present

## 2017-03-21 MED ORDER — AMPHETAMINE-DEXTROAMPHET ER 25 MG PO CP24: 25.0000 mg | ORAL_CAPSULE | Freq: Every day | ORAL | 0 refills | Status: DC

## 2017-03-21 NOTE — Progress Notes (Signed)
[facility]  PATIENT: Sean Espinoza DOB: 12/28/1975  REFERRING DOCTOR OR PCP:  Referred by Dr. Thad Rangereynolds SOURCE: paitent, ED notes, imaging reports, lab reports, MRI images on PACS  _________________________________   HISTORICAL  CHIEF COMPLAINT:  Chief Complaint  Patient presents with  . Multiple Sclerosis    Sts. he continues to have flushing for as long as 3 hrs. after Tecfidera doses, but this is tolerable for him.  Tinnitus left ear has returned.  Hard to focus vision from left eye.  Feels like everything is moving in slow motion.  Difficulty starying asleep.  Adderall XR not helping fatigue, but he is working about 80 hrs. per wk., with very little time off.[name].Chucky May/fim    HISTORY OF PgRESENT ILLNESS:  Sean Espinoza is a 41 yo man diagnosed with multiple sclerosis diagnosed in early 2018.  He is noting more cognitive issues.   Update 03/21/2017:  He is on Tecfidera 240 mg twice a day. He is experiencing flushing with almost every dose a couple hours after he takes it. This can be severe at times. He is taking the medication with food and has tried aspirin without much success.   His MS is mostly stable and there has been no exacerbation.   He has noted some more tinnitus in the left ear and the left eye seems to be more blurry.    Left ON was his presenting symptom and he feesl he is doing much worse out of that eye.   Right eye vision is fine.   Colors are desaturated.   Fatigue is more of a problem.  Adderall innitially helped but less so now.     Gait is ok..   The left leg goes numb easily.  He also notes mild left leg weakness.    He is more irritable and gets aggravated.   HE takes Zoloft for mild depression.   He has some decreased focus and attention, helped by the Adderall.  From 11/05/2016: MS:    He is tolerating Tecfidera ok.  No mor eflushing    He denies any significant GI issues.   He has GERD and that is unchanged.    No new exacerbation  Cognition:     A couple weeks ago, while on a teleconference, he was having a lot of difficulties getting the right words out. He felt he was speaking gibberish.   He was getting words out but they were jumbled and out of order.  After a few minutes the call was stopped and he feels this went on x 10 minutes.  He notes losing his train of thought in conversation.   MMSE today is 29/30.    He understood everything and he thought he was talking normally but his supervisor noted the changes.    He has noted decreased focus and attention.  He has had this for a while but it seems worse,   He notes missing turns and driving past where he was supposed to go a few times.     Left Sciatica:   He is noting continued lower back and leg pain.  Pain is worse first thing in the morning and is better after he's walked some. Pain started May 2018.   He notes some numbness and tingling in the left leg.   He feels weight bearing is worse on that leg.   He has no h/o lumbar spine issues.   He only notes mild buttock pain when sitting.  However, pain goes down the left leg when sitting on the left cheek.    A TPI at the last visit only helped x 1 day only.    MS:   He is tolerating Tecfidera ok.  He has had some flushing.    He denies any significant GI issues.   He has GERD and that is unchanged.    No new exacerbation  Gait/strength/sensation   He reports that his gait is doing well. He he denies any stumbling or falls. He notes no major difficulty with strength or sensation in the arms or legs.   The numbness that he was having on the left side has resolved.  Bladder: He denies any significant problem with bladder urgency or frequency or hesitancy. He does not have any incontinence.  Vision: He denies any difficulty with visual acuity, diplopia or eye pain.  Fatigue/sleep:  He notes mild fatigue, physical more than cognitive. Fatigue is worse with heat.[name]  He is sleeping worse since the leg pain started.   .  Mood:    He notes  more irritability but no change in depression.   He is on Zoloft.  X 6 years, more for anxiety than depression.    MS History:   In mid March, he had the onset of blurry vision out of the left eye. He noted that the visual field was worse on the left side than the right side. Additionally he was unable to read out of the left eye. Colors were mildly desaturated. On April 2, he had the onset of left facial numbness. Over the next day the numbness increased to involve the entire left side, worse in the arm than the leg. He also noted mild weakness in the left arm and reduced balance and gait.[name] He presented to [facility] and an MRI 07/02/2016 showed many T2/FLAIR hyperintense foci, four foci  enhanced  (right frontal subcortical white matter, right corpus callosum, left parietal subcortical white matter and left mesial temporal lobe), consistent with the diagnosis of MS. He was admitted and received 3 days of IV Solu-Medrol. His symptoms improved some and he was discharged.  He was referred to me and Gilford Raid was started in March 2018.     REVIEW OF SYSTEMS: Constitutional: No fevers, chills, sweats, or change in appetite.   Notes fatigue. Eyes: No visual changes, double vision, eye pain Ear, nose and throat: No hearing loss, ear pain, nasal congestion, sore throat Cardiovascular: No chest pain, palpitations Respiratory: No shortness of breath at rest or with exertion.   No wheezes GastrointestinaI: No nausea, vomiting, diarrhea, abdominal pain, fecal incontinence Genitourinary: No dysuria, urinary retention or frequency.  No nocturia. Musculoskeletal: No neck pain, back pain Integumentary: No rash, pruritus, skin lesions Neurological: as above Psychiatric: No depression at this time.  Mild anxiety Endocrine: No palpitations, diaphoresis, change in appetite, change in weigh or increased thirst Hematologic/Lymphatic: No anemia, purpura, petechiae. Allergic/Immunologic: No itchy/runny eyes,  nasal congestion, recent allergic reactions, rashes  ALLERGIES: No Known Allergies  HOME MEDICATIONS:  Current Outpatient Medications:  .  amphetamine-dextroamphetamine (ADDERALL XR) 25 MG 24 hr capsule, Take 1 capsule by mouth daily., Disp: 30 capsule, Rfl: 0 .  clomiPHENE (CLOMID) 50 MG tablet, 1/2 tab daily, Disp: 30 tablet, Rfl: 0 .  Dimethyl Fumarate 240 MG CPDR, Take 240 mg by mouth 2 (two) times daily. , Disp: , Rfl:  .  metoprolol (LOPRESSOR) 50 MG tablet, Take 50 mg by mouth 2 (two) times daily., Disp: ,  Rfl:  .  omeprazole (PRILOSEC) 20 MG capsule, Take 20 mg by mouth daily., Disp: , Rfl:  .  ondansetron (ZOFRAN ODT) 4 MG disintegrating tablet, Take 1 tablet (4 mg total) by mouth every 8 (eight) hours as needed for nausea or vomiting., Disp: 20 tablet, Rfl: 0 .  sertraline (ZOLOFT) 100 MG tablet, Take 100 mg by mouth daily., Disp: , Rfl:  .  natalizumab (TYSABRI) 300 MG/15ML injection, Inject 300 mg into the vein every 28 (twenty-eight) days., Disp: , Rfl:  No current facility-administered medications for this visit.   Facility-Administered Medications Ordered in Other Visits:  .  gadopentetate dimeglumine (MAGNEVIST) injection 20 mL, 20 mL, Intravenous, Once PRN, Sater, Pearletha Furl, MD  PAST MEDICAL HISTORY: Past Medical History:  Diagnosis Date  . Atrial fibrillation (HCC)   . Hypertension   . MS (multiple sclerosis) (HCC)   . Vision abnormalities     PAST SURGICAL HISTORY: Past Surgical History:  Procedure Laterality Date  . CERVICAL FUSION  2005, 2014    FAMILY HISTORY: Family History  Problem Relation Age of Onset  . Lupus Mother   . Healthy Father   . Lymphoma Maternal Grandfather   . Colon cancer Paternal Grandmother   . Kidney cancer Neg Hx   . Bladder Cancer Neg Hx   . Prostate cancer Neg Hx     SOCIAL HISTORY:  Social History   Socioeconomic History  . Marital status: Married    Spouse name: Not on file  . Number of children: Not on file  .  Years of education: Not on file  . Highest education level: Not on file  Social Needs  . Financial resource strain: Not on file  . Food insecurity - worry: Not on file  . Food insecurity - inability: Not on file  . Transportation needs - medical: Not on file  . Transportation needs - non-medical: Not on file  Occupational History  . Not on file  Tobacco Use  . Smoking status: Former Smoker    Last attempt to quit: 2003    Years since quitting: 15.9  . Smokeless tobacco: Never Used  Substance and Sexual Activity  . Alcohol use: Yes    Comment: social  . Drug use: No  . Sexual activity: Not on file  Other Topics Concern  . Not on file  Social History Narrative  . Not on file     PHYSICAL EXAM  Vitals:   03/21/17 0825  BP: 125/85  Pulse: 67  Resp: 14  Weight: 287 lb 8 oz (130.4 kg)  Height: 6\' 1"  (1.854 m)    Body mass index is 37.93 kg/m.   General: The patient is well-developed and well-nourished and in no acute distress  Musculoskeletal: He is moderately tender over the left piriformis muscle and mildly tender over the lower lumbar paraspinal muscles.   Neurologic Exam  Mental status: He scored 29/30 on the MMSE, losing one point on recall. With a hint, he was 30/30.  The patient is alert and oriented x 3 at the time of the examination. The patient has apparent normal recent and remote memory, with an apparently normal attention span and concentration ability.   Speech is normal.  Cranial nerves: Extraocular movements are full. The pupils show a 1+ left APD.    Colors are desaturated out of the left eye.   Facial strength and sensation is normal.  Trapezius and sternocleidomastoid strength is normal. No dysarthria is noted.  The tongue is midline,  and the patient has symmetric elevation of the soft palate. No obvious hearing deficits are noted.  Motor:  Muscle bulk is normal.   Tone is normal. Strength is  5 / 5 in all 4 extremities.   Sensory: Sensory testing is  intact on the right.  He has reduced vibration sensation, touch and temperature in the left arm and left leg.   Coordination: Cerebellar testing reveals good finger-nose-finger and heel-to-shin bilaterally.  Gait and station: Station is normal.   Gait is stiff and mildly wide . There is no foot drop. Tandem walk is very wide. Romberg is negative.   Reflexes: Deep tendon reflexes are symmetric and increased at the knees with spread. Deep tendon reflexes are increased at the ankles without clonus.       DIAGNOSTIC DATA (LABS, IMAGING, TESTING) - I reviewed patient records, labs, notes, testing and imaging myself where available.  Lab Results  Component Value Date   WBC 8.0 01/20/2017   HGB 14.7 01/20/2017   HCT 42.0 01/20/2017   MCV 85.5 01/20/2017   PLT 257 01/20/2017      Component Value Date/Time   NA 141 01/20/2017 0311   K 3.2 (L) 01/20/2017 0311   CL 106 01/20/2017 0311   CO2 21 (L) 01/20/2017 0311   GLUCOSE 163 (H) 01/20/2017 0311   BUN 15 01/20/2017 0311   CREATININE 1.13 01/20/2017 0311   CALCIUM 9.1 01/20/2017 0311   PROT 6.8 01/30/2017 0827   ALBUMIN 4.7 01/30/2017 0827   AST 8 01/30/2017 0827   ALT 12 01/30/2017 0827   ALKPHOS 49 01/30/2017 0827   BILITOT 0.3 01/30/2017 0827   GFRNONAA >60 01/20/2017 0311   GFRAA >60 01/20/2017 0311   Lab Results  Component Value Date   CHOL 197 07/02/2016   HDL 29 (L) 07/02/2016   LDLCALC 127 (H) 07/02/2016   TRIG 206 (H) 07/02/2016   CHOLHDL 6.8 07/02/2016   Lab Results  Component Value Date   HGBA1C 5.1 07/02/2016        ASSESSMENT AND PLAN  Multiple sclerosis (HCC) - Plan: Stratify JCV Antibody Test ([facility]), CBC with Differential/Platelet, Hepatic function panel, Varicella zoster antibody, IgG  Left optic neuritis  Blurred vision, left eye  Gait disturbance  Attention deficit disorder (ADD) in adult  High risk medication use - Plan: Stratify JCV Antibody Test ([facility]), CBC with Differential/Platelet,  Hepatic function panel, Varicella zoster antibody, IgG   1.   He is unable to tolerate Tecfidera with practically daily flushing. He also has fatigue and some mood disturbance. Because of the mood disturbances, he is not a candidate for interferon's. His presentation was moderately aggressive. Therefore, Donnamarie Poag may be a good option for him. Earlier this year he was JCV antibody negative. We will recheck his JCV antibody status and try to get him started.   If the JCV antibody is significantly elevated, consider Gilenya instead. . 2.  Increase Adderall XR to 25 mg daily. 3.   Return for regular visit in 4 months but sooner if there are new or worsening symptoms.    Richard A. Epimenio Foot, MD, PhD 03/21/2017, 1:59 PM Dir., [facility]  Certified in Neurology, Clinical Neurophysiology, Sleep Medicine, Pain Medicine and Neuroimaging   [facility] [address], [address] [place], [place] [address] [phone]/

## 2017-03-22 LAB — HEPATIC FUNCTION PANEL
ALBUMIN: 4.7 g/dL (ref 3.5–5.5)
ALK PHOS: 46 IU/L (ref 39–117)
ALT: 12 IU/L (ref 0–44)
AST: 10 IU/L (ref 0–40)
BILIRUBIN, DIRECT: 0.14 mg/dL (ref 0.00–0.40)
Bilirubin Total: 0.5 mg/dL (ref 0.0–1.2)
Total Protein: 6.7 g/dL (ref 6.0–8.5)

## 2017-03-22 LAB — CBC WITH DIFFERENTIAL/PLATELET
BASOS ABS: 0 10*3/uL (ref 0.0–0.2)
Basos: 0 %
EOS (ABSOLUTE): 0.4 10*3/uL (ref 0.0–0.4)
Eos: 7 %
Hematocrit: 44.2 % (ref 37.5–51.0)
Hemoglobin: 14.6 g/dL (ref 13.0–17.7)
Immature Grans (Abs): 0 10*3/uL (ref 0.0–0.1)
Immature Granulocytes: 0 %
LYMPHS ABS: 1.6 10*3/uL (ref 0.7–3.1)
Lymphs: 28 %
MCH: 28.9 pg (ref 26.6–33.0)
MCHC: 33 g/dL (ref 31.5–35.7)
MCV: 87 fL (ref 79–97)
MONOS ABS: 0.4 10*3/uL (ref 0.1–0.9)
Monocytes: 7 %
NEUTROS ABS: 3.2 10*3/uL (ref 1.4–7.0)
Neutrophils: 58 %
PLATELETS: 222 10*3/uL (ref 150–379)
RBC: 5.06 x10E6/uL (ref 4.14–5.80)
RDW: 15.1 % (ref 12.3–15.4)
WBC: 5.6 10*3/uL (ref 3.4–10.8)

## 2017-03-22 LAB — VARICELLA ZOSTER ANTIBODY, IGG: VARICELLA: 3979 {index} (ref >165)

## 2017-04-02 ENCOUNTER — Ambulatory Visit (INDEPENDENT_AMBULATORY_CARE_PROVIDER_SITE_OTHER): Payer: BLUE CROSS/BLUE SHIELD | Admitting: Neurology

## 2017-04-02 ENCOUNTER — Telehealth: Payer: Self-pay | Admitting: Neurology

## 2017-04-02 ENCOUNTER — Other Ambulatory Visit: Payer: Self-pay

## 2017-04-02 ENCOUNTER — Encounter: Payer: Self-pay | Admitting: Neurology

## 2017-04-02 VITALS — BP 151/97 | HR 75 | Resp 18 | Ht 73.0 in | Wt 287.0 lb

## 2017-04-02 DIAGNOSIS — G35 Multiple sclerosis: Secondary | ICD-10-CM | POA: Diagnosis not present

## 2017-04-02 DIAGNOSIS — R200 Anesthesia of skin: Secondary | ICD-10-CM | POA: Diagnosis not present

## 2017-04-02 DIAGNOSIS — H469 Unspecified optic neuritis: Secondary | ICD-10-CM

## 2017-04-02 DIAGNOSIS — R269 Unspecified abnormalities of gait and mobility: Secondary | ICD-10-CM | POA: Diagnosis not present

## 2017-04-02 DIAGNOSIS — F988 Other specified behavioral and emotional disorders with onset usually occurring in childhood and adolescence: Secondary | ICD-10-CM

## 2017-04-02 DIAGNOSIS — G35D Multiple sclerosis, unspecified: Secondary | ICD-10-CM

## 2017-04-02 MED ORDER — DULOXETINE HCL 60 MG PO CPEP: 60.0000 mg | ORAL_CAPSULE | Freq: Every day | ORAL | 11 refills | Status: DC

## 2017-04-02 MED ORDER — AMPHETAMINE-DEXTROAMPHET ER 25 MG PO CP24: 25.0000 mg | ORAL_CAPSULE | Freq: Every day | ORAL | 0 refills | Status: DC

## 2017-04-02 NOTE — Telephone Encounter (Signed)
Patient requesting a call back. He is having MS exacerbation.

## 2017-04-02 NOTE — Progress Notes (Signed)
[facility]  PATIENT: Sean Espinoza DOB: Apr 12, 1975  REFERRING DOCTOR OR PCP:  Referred by Dr. Thad Ranger SOURCE: paitent, ED notes, imaging reports, lab reports, MRI images on PACS  _________________________________   HISTORICAL  CHIEF COMPLAINT:  Chief Complaint  Patient presents with  . Multiple Sclerosis    Left sided arm and leg weakness, pins and needles sensation bilat feet, blurry vision left eye, left sided facial pain, onset 03/26/17.  Visual acuity today, 20/30 right eye, 20/200 left eye, without correction. Also c/o worsening mood swings. "From being happy, to I don't even want to be around myself."  Denies missed doses of Tecfidera.  JCV ab checked 03/21/17 is Negative/fim    HISTORY OF PgRESENT ILLNESS:  Sean Espinoza is a 42 yo man diagnosed with multiple sclerosis diagnosed in early 2018.  He is noting more cognitive issues.   Update 04/02/2017:  He began to experience left-sided arm and leg weakness and dysesthesias in his feet and blurry vision in the left eye about one week ago.  Of note, his initial exacerbation with MS left optic neuritis. Vision improved but never to 20/20. Today, he is 20/200 in the left eye.  He had had some difficulty with left sided numbness but feels his current symptoms are significantly worse.   He continues to take the Tecfidera although he has excessive flushing. At the last visit we had discussed other options for treatment.   He was leaning towards Tysabri or Gilenya. We discussed both of these options in more detail. The JCV antibody is negative. Therefore, my recommendation for him is to switch to Tysabri.   Update 03/21/2017:  He is on Tecfidera 240 mg twice a day. He is experiencing flushing with almost every dose a couple hours after he takes it. This can be severe at times. He is taking the medication with food and has tried aspirin without much success.   His MS is mostly stable and there has been no  exacerbation.   He has noted some more tinnitus in the left ear and the left eye seems to be more blurry.    Left ON was his presenting symptom and he feesl he is doing much worse out of that eye.   Right eye vision is fine.   Colors are desaturated.   Fatigue is more of a problem.  Adderall innitially helped but less so now.     Gait is ok..   The left leg goes numb easily.  He also notes mild left leg weakness.    He is more irritable and gets aggravated.   HE takes Zoloft for mild depression.   He has some decreased focus and attention, helped by the Adderall.  From 11/05/2016: MS:    He is tolerating Tecfidera ok.  No mor eflushing    He denies any significant GI issues.   He has GERD and that is unchanged.    No new exacerbation  Cognition:    A couple weeks ago, while on a teleconference, he was having a lot of difficulties getting the right words out. He felt he was speaking gibberish.   He was getting words out but they were jumbled and out of order.  After a few minutes the call was stopped and he feels this went on x 10 minutes.  He notes losing his train of thought in conversation.   MMSE today is 29/30.    He understood everything and he thought he was talking normally but  his supervisor noted the changes.    He has noted decreased focus and attention.  He has had this for a while but it seems worse,   He notes missing turns and driving past where he was supposed to go a few times.     Left Sciatica:   He is noting continued lower back and leg pain.  Pain is worse first thing in the morning and is better after he's walked some. Pain started May 2018.   He notes some numbness and tingling in the left leg.   He feels weight bearing is worse on that leg.   He has no h/o lumbar spine issues.   He only notes mild buttock pain when sitting.   However, pain goes down the left leg when sitting on the left cheek.    A TPI at the last visit only helped x 1 day only.    MS:   He is tolerating Tecfidera ok.   He has had some flushing.    He denies any significant GI issues.   He has GERD and that is unchanged.    No new exacerbation  Gait/strength/sensation   He reports that his gait is doing well. He he denies any stumbling or falls. He notes no major difficulty with strength or sensation in the arms or legs.   The numbness that he was having on the left side has resolved.  Bladder: He denies any significant problem with bladder urgency or frequency or hesitancy. He does not have any incontinence.  Vision: He denies any difficulty with visual acuity, diplopia or eye pain.  Fatigue/sleep:  He notes mild fatigue, physical more than cognitive. Fatigue is worse with heat.[name]  He is sleeping worse since the leg pain started.   .  Mood:    He notes more irritability but no change in depression.   He is on Zoloft.  X 6 years, more for anxiety than depression.    MS History:   In mid March, he had the onset of blurry vision out of the left eye. He noted that the visual field was worse on the left side than the right side. Additionally he was unable to read out of the left eye. Colors were mildly desaturated. On April 2, he had the onset of left facial numbness. Over the next day the numbness increased to involve the entire left side, worse in the arm than the leg. He also noted mild weakness in the left arm and reduced balance and gait.[name] He presented to [facility] and an MRI 07/02/2016 showed many T2/FLAIR hyperintense foci, four foci  enhanced  (right frontal subcortical white matter, right corpus callosum, left parietal subcortical white matter and left mesial temporal lobe), consistent with the diagnosis of MS. He was admitted and received 3 days of IV Solu-Medrol. His symptoms improved some and he was discharged.  He was referred to me and Gilford Raid was started in March 2018.     REVIEW OF SYSTEMS: Constitutional: No fevers, chills, sweats, or change in appetite.   Notes fatigue. Eyes: No visual changes,  double vision, eye pain Ear, nose and throat: No hearing loss, ear pain, nasal congestion, sore throat Cardiovascular: No chest pain, palpitations Respiratory: No shortness of breath at rest or with exertion.   No wheezes GastrointestinaI: No nausea, vomiting, diarrhea, abdominal pain, fecal incontinence Genitourinary: No dysuria, urinary retention or frequency.  No nocturia. Musculoskeletal: No neck pain, back pain Integumentary: No rash, pruritus, skin lesions Neurological: as  above Psychiatric: No depression at this time.  Mild anxiety Endocrine: No palpitations, diaphoresis, change in appetite, change in weigh or increased thirst Hematologic/Lymphatic: No anemia, purpura, petechiae. Allergic/Immunologic: No itchy/runny eyes, nasal congestion, recent allergic reactions, rashes  ALLERGIES: No Known Allergies  HOME MEDICATIONS:  Current Outpatient Medications:  .  amphetamine-dextroamphetamine (ADDERALL XR) 25 MG 24 hr capsule, Take 1 capsule by mouth daily., Disp: 30 capsule, Rfl: 0 .  clomiPHENE (CLOMID) 50 MG tablet, 1/2 tab daily, Disp: 30 tablet, Rfl: 0 .  Dimethyl Fumarate 240 MG CPDR, Take 240 mg by mouth 2 (two) times daily. , Disp: , Rfl:  .  metoprolol (LOPRESSOR) 50 MG tablet, Take 50 mg by mouth 2 (two) times daily., Disp: , Rfl:  .  natalizumab (TYSABRI) 300 MG/15ML injection, Inject 300 mg into the vein every 28 (twenty-eight) days., Disp: , Rfl:  .  omeprazole (PRILOSEC) 20 MG capsule, Take 20 mg by mouth daily., Disp: , Rfl:  .  DULoxetine (CYMBALTA) 60 MG capsule, Take 1 capsule (60 mg total) by mouth daily., Disp: 30 capsule, Rfl: 11 No current facility-administered medications for this visit.   Facility-Administered Medications Ordered in Other Visits:  .  gadopentetate dimeglumine (MAGNEVIST) injection 20 mL, 20 mL, Intravenous, Once PRN, Sater, Pearletha Furl, MD  PAST MEDICAL HISTORY: Past Medical History:  Diagnosis Date  . Atrial fibrillation (HCC)   .  Hypertension   . MS (multiple sclerosis) (HCC)   . Vision abnormalities     PAST SURGICAL HISTORY: Past Surgical History:  Procedure Laterality Date  . CERVICAL FUSION  2005, 2014    FAMILY HISTORY: Family History  Problem Relation Age of Onset  . Lupus Mother   . Healthy Father   . Lymphoma Maternal Grandfather   . Colon cancer Paternal Grandmother   . Kidney cancer Neg Hx   . Bladder Cancer Neg Hx   . Prostate cancer Neg Hx     SOCIAL HISTORY:  Social History   Socioeconomic History  . Marital status: Married    Spouse name: Not on file  . Number of children: Not on file  . Years of education: Not on file  . Highest education level: Not on file  Social Needs  . Financial resource strain: Not on file  . Food insecurity - worry: Not on file  . Food insecurity - inability: Not on file  . Transportation needs - medical: Not on file  . Transportation needs - non-medical: Not on file  Occupational History  . Not on file  Tobacco Use  . Smoking status: Former Smoker    Last attempt to quit: 2003    Years since quitting: 16.0  . Smokeless tobacco: Never Used  Substance and Sexual Activity  . Alcohol use: Yes    Comment: social  . Drug use: No  . Sexual activity: Not on file  Other Topics Concern  . Not on file  Social History Narrative  . Not on file     PHYSICAL EXAM  Vitals:   04/02/17 1000  BP: (!) 151/97  Pulse: 75  Resp: 18  Weight: 287 lb (130.2 kg)  Height: 6\' 1"  (1.854 m)    Body mass index is 37.87 kg/m.   General: The patient is well-developed and well-nourished and in no acute distress  Vision:   20/200 OS and 20/30 OD   Neurologic Exam  Mental status:    The patient is alert and oriented x 3 at the time of the examination. The  patient has apparent normal recent and remote memory, with an apparently normal attention span and concentration ability.   Speech is normal.  Cranial nerves: Extraocular movements are full. The pupils show  a 2+ left APD.    Colors are desaturated out of the left eye.   Facial strength and sensation is normal.  Trapezius and sternocleidomastoid strength is normal. No dysarthria is noted.  The tongue is midline, and the patient has symmetric elevation of the soft palate. No obvious hearing deficits are noted.  Motor:  Muscle bulk is normal.   Tone is normal. Strength is  5 / 5 on the right but 4/5 in the left leg and he has reduced rapid alternate movements in the left hand  Sensory: Sensory testing is intact on the right.  He has reduced vibration sensation, touch and temperature in the left arm and left leg.   Coordination: Cerebellar testing reveals reduced left finger-nose-finger and reduced left heel-to-shin  Gait and station: Station is normal.   Gait is stiff and mildly wide . There is a left foot drop. Tandem walk is very wide. Romberg is negative.   Reflexes: Deep tendon reflexes are symmetric and increased at the knees with spread. Deep tendon reflexes are increased at the ankles without clonus.       DIAGNOSTIC DATA (LABS, IMAGING, TESTING) - I reviewed patient records, labs, notes, testing and imaging myself where available.  Lab Results  Component Value Date   WBC 5.6 03/21/2017   HGB 14.6 03/21/2017   HCT 44.2 03/21/2017   MCV 87 03/21/2017   PLT 222 03/21/2017      Component Value Date/Time   NA 141 01/20/2017 0311   K 3.2 (L) 01/20/2017 0311   CL 106 01/20/2017 0311   CO2 21 (L) 01/20/2017 0311   GLUCOSE 163 (H) 01/20/2017 0311   BUN 15 01/20/2017 0311   CREATININE 1.13 01/20/2017 0311   CALCIUM 9.1 01/20/2017 0311   PROT 6.7 03/21/2017 0914   ALBUMIN 4.7 03/21/2017 0914   AST 10 03/21/2017 0914   ALT 12 03/21/2017 0914   ALKPHOS 46 03/21/2017 0914   BILITOT 0.5 03/21/2017 0914   GFRNONAA >60 01/20/2017 0311   GFRAA >60 01/20/2017 0311   Lab Results  Component Value Date   CHOL 197 07/02/2016   HDL 29 (L) 07/02/2016   LDLCALC 127 (H) 07/02/2016   TRIG 206  (H) 07/02/2016   CHOLHDL 6.8 07/02/2016   Lab Results  Component Value Date   HGBA1C 5.1 07/02/2016        ASSESSMENT AND PLAN  Multiple sclerosis (HCC)  Left optic neuritis  Gait disturbance  Numbness  Attention deficit disorder (ADD) in adult   1.    I had a long discussion about changes in disease modifying therapies for his MS. He is having a significant exacerbation while on Tecfidera. Additionally, he has tolerated it poorly. Because of the significant exacerbation I believe his best option is to switch to Tysabri. This may also help his fatigue a little bit more as well. He has signed a service request form and we will try to get authorized. I will have him get 3 days of IV Solu-Medrol this week and I wrote him out of work until next Monday.. 2.  Continue Adderall XR 25 mg daily.   Duloxetine for mood 3.   Return for regular visit in 4 months but sooner if there are new or worsening symptoms.    Richard A. Epimenio Foot, MD, PhD  04/02/2017, 3:18 PM Dir., [facility]  Certified in Neurology, Clinical Neurophysiology, Sleep Medicine, Pain Medicine and Neuroimaging   [facility] [address], [address] [place], [place] [address] [phone]/

## 2017-04-02 NOTE — Telephone Encounter (Signed)
Spoke with Sean Espinoza this morning.  He c/o left arm/leg weakness, pins and needles sensation bilat hands/feet, blurry vision left eye onset 3 days ago, not resolving.  Seen by urgent care yesterday and told to f/u with [facility].  W/I appt. given this morning/fim

## 2017-04-02 NOTE — Progress Notes (Signed)
IV 24G started in right AC. Site clean, dry, intact. Pt tolerated well. IV Solumedrol 1g. Lot: [id number]. Expiration: 06/2020. NDC: [phone] given over 30 min IV. Written order for Solumedrol 1gm IV dailyx3 days given to intrafusion to place in pt chart. Tina/Mindy out of office today. Pt scheduled for infusion tomorrow morning.

## 2017-04-04 ENCOUNTER — Encounter: Payer: Self-pay | Admitting: *Deleted

## 2017-06-12 ENCOUNTER — Telehealth: Payer: Self-pay | Admitting: Neurology

## 2017-06-12 ENCOUNTER — Other Ambulatory Visit: Payer: Self-pay | Admitting: Neurology

## 2017-06-12 MED ORDER — AMPHETAMINE-DEXTROAMPHET ER 25 MG PO CP24: 25.0000 mg | ORAL_CAPSULE | Freq: Every day | ORAL | 0 refills | Status: DC

## 2017-06-12 NOTE — Telephone Encounter (Signed)
Pt request refill for amphetamine-dextroamphetamine (ADDERALL XR) 25 MG 24 hr capsule. He is aware the clinic closes at noon tomorrow

## 2017-06-18 ENCOUNTER — Telehealth: Payer: Self-pay | Admitting: *Deleted

## 2017-06-18 MED ORDER — AMPHETAMINE-DEXTROAMPHET ER 25 MG PO CP24: 25.0000 mg | ORAL_CAPSULE | Freq: Every day | ORAL | 0 refills | Status: DC

## 2017-06-18 NOTE — Telephone Encounter (Signed)
Post-dated Adderall rx. provided during infusion appt. to save pt. a trip back to the office/fim

## 2017-07-01 ENCOUNTER — Telehealth: Payer: Self-pay | Admitting: *Deleted

## 2017-07-01 DIAGNOSIS — Z0289 Encounter for other administrative examinations: Secondary | ICD-10-CM

## 2017-07-01 NOTE — Telephone Encounter (Signed)
Pt [employer] form on [employer].

## 2017-07-01 NOTE — Telephone Encounter (Signed)
FMLA forms for spouse completed, faxed to [facility], fax# [phone]/fim

## 2017-07-10 ENCOUNTER — Telehealth: Payer: Self-pay | Admitting: Neurology

## 2017-07-10 NOTE — Telephone Encounter (Signed)
Spoke with Arlys John. He c/o tingling left arm/leg, decreased vision left eye onset 3 days ago, getting worse since onset.  Also left sided h/a that waxes and wanes with otc meds, but doesn't completely resolve.  Per RAS, ok for 3 days IV SM. Pt. coming in now for first day/fim

## 2017-07-10 NOTE — Telephone Encounter (Signed)
Pt called thinks he is having MS flareup. He is experiencing issues with vision out of the left eye like a haze over it, pins and needles left arm and left leg, feels like more than normal brain fog. Please call to advise asap.

## 2017-07-16 ENCOUNTER — Encounter: Payer: Self-pay | Admitting: *Deleted

## 2017-08-21 ENCOUNTER — Other Ambulatory Visit: Payer: Self-pay | Admitting: Neurology

## 2017-08-21 MED ORDER — AMPHETAMINE-DEXTROAMPHET ER 25 MG PO CP24: 25.0000 mg | ORAL_CAPSULE | Freq: Every day | ORAL | 0 refills | Status: DC

## 2017-08-21 NOTE — Telephone Encounter (Signed)
Pt request refill for amphetamine-dextroamphetamine (ADDERALL XR) 25 MG 24 hr capsule sent to [facility]. He is will be out before Tuesday

## 2017-09-25 ENCOUNTER — Other Ambulatory Visit: Payer: Self-pay | Admitting: Neurology

## 2017-09-25 MED ORDER — AMPHETAMINE-DEXTROAMPHET ER 25 MG PO CP24: 25.0000 mg | ORAL_CAPSULE | Freq: Every day | ORAL | 0 refills | Status: DC

## 2017-09-25 NOTE — Addendum Note (Signed)
Addended by: Candis Schatz I on: 09/25/2017 11:47 AM   Modules accepted: Orders

## 2017-09-25 NOTE — Telephone Encounter (Signed)
Pt has called for a refill on his amphetamine-dextroamphetamine (ADDERALL XR) 25 MG 24 hr capsule Please send to  [facility] [id number] - [place], [place] - [address] AT [facility] OF [address] [phone] (Phone) [phone] (Fax)

## 2017-10-28 ENCOUNTER — Other Ambulatory Visit: Payer: Self-pay | Admitting: Neurology

## 2017-10-28 MED ORDER — AMPHETAMINE-DEXTROAMPHET ER 25 MG PO CP24: 25.0000 mg | ORAL_CAPSULE | Freq: Every day | ORAL | 0 refills | Status: DC

## 2017-10-28 NOTE — Telephone Encounter (Signed)
Patient requesting refill of amphetamine-dextroamphetamine (ADDERALL XR) 25 MG 24 hr capsule sent to [facility] in [place].

## 2017-10-28 NOTE — Addendum Note (Signed)
Addended by: Rocky Link A on: 10/28/2017 01:59 PM   Modules accepted: Orders

## 2017-10-28 NOTE — Telephone Encounter (Signed)
Rx registry checked. Last fill date is 09/25/17 for #30. Patient does not have a follow up scheduled.

## 2017-11-12 ENCOUNTER — Other Ambulatory Visit: Payer: Self-pay | Admitting: *Deleted

## 2017-11-12 ENCOUNTER — Other Ambulatory Visit: Payer: Self-pay | Admitting: Neurology

## 2017-11-12 ENCOUNTER — Other Ambulatory Visit: Payer: Self-pay

## 2017-11-12 DIAGNOSIS — Z79899 Other long term (current) drug therapy: Secondary | ICD-10-CM

## 2017-11-12 DIAGNOSIS — G35 Multiple sclerosis: Secondary | ICD-10-CM

## 2017-11-12 MED ORDER — METOPROLOL TARTRATE 50 MG PO TABS: 50.0000 mg | ORAL_TABLET | Freq: Two times a day (BID) | ORAL | 5 refills | Status: DC

## 2017-11-17 ENCOUNTER — Encounter: Payer: Self-pay | Admitting: *Deleted

## 2017-11-28 ENCOUNTER — Other Ambulatory Visit: Payer: Self-pay | Admitting: Neurology

## 2017-11-28 MED ORDER — AMPHETAMINE-DEXTROAMPHET ER 25 MG PO CP24: 25.0000 mg | ORAL_CAPSULE | Freq: Every day | ORAL | 0 refills | Status: DC

## 2017-11-28 NOTE — Telephone Encounter (Signed)
Pt requesting refills for amphetamine-dextroamphetamine (ADDERALL XR) 25 MG 24 hr capsule sent to [facility]

## 2018-01-01 ENCOUNTER — Other Ambulatory Visit: Payer: Self-pay | Admitting: Neurology

## 2018-01-01 MED ORDER — AMPHETAMINE-DEXTROAMPHET ER 25 MG PO CP24: 25.0000 mg | ORAL_CAPSULE | Freq: Every day | ORAL | 0 refills | Status: DC

## 2018-01-01 NOTE — Addendum Note (Signed)
Addended by: Candis Schatz I on: 01/01/2018 12:02 PM   Modules accepted: Orders

## 2018-01-01 NOTE — Telephone Encounter (Signed)
Pt has called for a refill on amphetamine-dextroamphetamine (ADDERALL XR) 25 MG 24 hr capsule Please send to  [id number] - [place], [place] - [address] AT [facility] OF [address] [phone] (Phone) [phone] (Fax)

## 2018-02-04 ENCOUNTER — Telehealth: Payer: Self-pay | Admitting: Neurology

## 2018-02-04 MED ORDER — AMPHETAMINE-DEXTROAMPHET ER 25 MG PO CP24: 25.0000 mg | ORAL_CAPSULE | Freq: Every day | ORAL | 0 refills | Status: DC

## 2018-02-04 NOTE — Telephone Encounter (Signed)
Patient requesting refill of Adderall. Here today for infusion. Best call back is [phone]

## 2018-02-04 NOTE — Telephone Encounter (Signed)
Drug registry checked. He last refilled rx on 01/01/18 #30. Last seen 04/02/17 and next f/u 02/25/18. Rx printed, waiting on MD signature

## 2018-02-04 NOTE — Telephone Encounter (Signed)
Gave printed/signed rx to patient while in office today for infusion.

## 2018-02-25 ENCOUNTER — Ambulatory Visit (INDEPENDENT_AMBULATORY_CARE_PROVIDER_SITE_OTHER): Payer: BLUE CROSS/BLUE SHIELD | Admitting: Neurology

## 2018-02-25 ENCOUNTER — Encounter: Payer: Self-pay | Admitting: Neurology

## 2018-02-25 VITALS — BP 132/94 | HR 71 | Ht 73.0 in | Wt 280.5 lb

## 2018-02-25 DIAGNOSIS — R200 Anesthesia of skin: Secondary | ICD-10-CM | POA: Diagnosis not present

## 2018-02-25 DIAGNOSIS — R269 Unspecified abnormalities of gait and mobility: Secondary | ICD-10-CM | POA: Diagnosis not present

## 2018-02-25 DIAGNOSIS — G35 Multiple sclerosis: Secondary | ICD-10-CM

## 2018-02-25 DIAGNOSIS — H469 Unspecified optic neuritis: Secondary | ICD-10-CM | POA: Diagnosis not present

## 2018-02-25 DIAGNOSIS — F988 Other specified behavioral and emotional disorders with onset usually occurring in childhood and adolescence: Secondary | ICD-10-CM

## 2018-02-25 MED ORDER — OXYBUTYNIN CHLORIDE 5 MG PO TABS: 5.0000 mg | ORAL_TABLET | Freq: Two times a day (BID) | ORAL | 5 refills | Status: DC

## 2018-02-25 MED ORDER — AMPHETAMINE-DEXTROAMPHETAMINE 15 MG PO TABS: 15.0000 mg | ORAL_TABLET | Freq: Every day | ORAL | 0 refills | Status: DC

## 2018-02-25 NOTE — Progress Notes (Signed)
[facility]  PATIENT: Sean Espinoza DOB: May 25, 1975  REFERRING DOCTOR OR PCP:  Referred by Dr. Thad Ranger SOURCE: paitent, ED notes, imaging reports, lab reports, MRI images on PACS  _________________________________   HISTORICAL  CHIEF COMPLAINT:  Chief Complaint  Patient presents with  . Follow-up    4 month follow up. Alone. Rm 13. Patient mentioned over the last 6-8 weeks he's been dealing with joint pain in his fingers. He also mentioned that over the last month he has had some troubel walking and with is hearing on the left side. Wants to discuss changing his Adderrall.  . Multiple Sclerosis    On Tysabri. 11/12/17 JCV ab Indeterminate at 0.28.  Inhibition Assay Negative.     HISTORY OF PgRESENT ILLNESS:  Sean Espinoza is a 42 y.o. man  diagnosed with multiple sclerosis  in early 2018.    Update 02/25/2018: He is on Tysabri since he had an exacerbation 04/2017.   He is tolerating it well.    JCV Ab ws as negative   He feels his gait is more off balanced and he needs to catch himself more.   Strength is similar.  He gets a tingling sensation in his feet, left > right.    He notes the tingling more now than in the past.   He notes welling in his hands and they seem to freeze up if he is not constantly moving them .    He feels bladder frequency has increased and he has nocturia at least 4 times a night.    He has never been on medication.   He has no hesitancy.     He has reduced OS vision since diagnosis.     He notes some reduced hearing.      He feels his mood has been up and down a lot this year..   Duloxetine has not helped.   He has decreased focus/attention and fatigue.    Adderall seemed to help at first but less so now.    He no longer sees a difference when he takes it compared to a day when he does not.    He feels he gets 5-8 hours of sleep.      Update 04/02/2017:  He began to experience left-sided arm and leg weakness and dysesthesias in his feet  and blurry vision in the left eye about one week ago.  Of note, his initial exacerbation with MS left optic neuritis. Vision improved but never to 20/20. Today, he is 20/200 in the left eye.  He had had some difficulty with left sided numbness but feels his current symptoms are significantly worse.   He continues to take the Tecfidera although he has excessive flushing. At the last visit we had discussed other options for treatment.   He was leaning towards Tysabri or Gilenya. We discussed both of these options in more detail. The JCV antibody is negative. Therefore, my recommendation for him is to switch to Tysabri.   Update 03/21/2017:  He is on Tecfidera 240 mg twice a day. He is experiencing flushing with almost every dose a couple hours after he takes it. This can be severe at times. He is taking the medication with food and has tried aspirin without much success.   His MS is mostly stable and there has been no exacerbation.   He has noted some more tinnitus in the left ear and the left eye seems to be more blurry.    Left  ON was his presenting symptom and he feesl he is doing much worse out of that eye.   Right eye vision is fine.   Colors are desaturated.   Fatigue is more of a problem.  Adderall innitially helped but less so now.     Gait is ok..   The left leg goes numb easily.  He also notes mild left leg weakness.    He is more irritable and gets aggravated.   HE takes Zoloft for mild depression.   He has some decreased focus and attention, helped by the Adderall.  From 11/05/2016: MS:    He is tolerating Tecfidera ok.  No mor eflushing    He denies any significant GI issues.   He has GERD and that is unchanged.    No new exacerbation  Cognition:    A couple weeks ago, while on a teleconference, he was having a lot of difficulties getting the right words out. He felt he was speaking gibberish.   He was getting words out but they were jumbled and out of order.  After a few minutes the call was  stopped and he feels this went on x 10 minutes.  He notes losing his train of thought in conversation.   MMSE today is 29/30.    He understood everything and he thought he was talking normally but his supervisor noted the changes.    He has noted decreased focus and attention.  He has had this for a while but it seems worse,   He notes missing turns and driving past where he was supposed to go a few times.     Left Sciatica:   He is noting continued lower back and leg pain.  Pain is worse first thing in the morning and is better after he's walked some. Pain started May 2018.   He notes some numbness and tingling in the left leg.   He feels weight bearing is worse on that leg.   He has no h/o lumbar spine issues.   He only notes mild buttock pain when sitting.   However, pain goes down the left leg when sitting on the left cheek.    A TPI at the last visit only helped x 1 day only.    MS:   He is tolerating Tecfidera ok.  He has had some flushing.    He denies any significant GI issues.   He has GERD and that is unchanged.    No new exacerbation  Gait/strength/sensation   He reports that his gait is doing well. He he denies any stumbling or falls. He notes no major difficulty with strength or sensation in the arms or legs.   The numbness that he was having on the left side has resolved.  Bladder: He denies any significant problem with bladder urgency or frequency or hesitancy. He does not have any incontinence.  Vision: He denies any difficulty with visual acuity, diplopia or eye pain.  Fatigue/sleep:  He notes mild fatigue, physical more than cognitive. Fatigue is worse with heat.[name]  He is sleeping worse since the leg pain started.   .  Mood:    He notes more irritability but no change in depression.   He is on Zoloft.  X 6 years, more for anxiety than depression.    MS History:   In mid March, he had the onset of blurry vision out of the left eye. He noted that the visual field was worse on the left  side than the right side. Additionally he was unable to read out of the left eye. Colors were mildly desaturated. On April 2, he had the onset of left facial numbness. Over the next day the numbness increased to involve the entire left side, worse in the arm than the leg. He also noted mild weakness in the left arm and reduced balance and gait.[name] He presented to [facility] and an MRI 07/02/2016 showed many T2/FLAIR hyperintense foci, four foci  enhanced  (right frontal subcortical white matter, right corpus callosum, left parietal subcortical white matter and left mesial temporal lobe), consistent with the diagnosis of MS. He was admitted and received 3 days of IV Solu-Medrol. His symptoms improved some and he was discharged.  He was referred to me and Gilford Raid was started in March 2018.     REVIEW OF SYSTEMS: Constitutional: No fevers, chills, sweats, or change in appetite.   Notes fatigue. Eyes: No visual changes, double vision, eye pain Ear, nose and throat: No hearing loss, ear pain, nasal congestion, sore throat Cardiovascular: No chest pain, palpitations Respiratory: No shortness of breath at rest or with exertion.   No wheezes GastrointestinaI: No nausea, vomiting, diarrhea, abdominal pain, fecal incontinence Genitourinary: No dysuria, urinary retention or frequency.  No nocturia. Musculoskeletal: No neck pain, back pain Integumentary: No rash, pruritus, skin lesions Neurological: as above Psychiatric: No depression at this time.  Mild anxiety Endocrine: No palpitations, diaphoresis, change in appetite, change in weigh or increased thirst Hematologic/Lymphatic: No anemia, purpura, petechiae. Allergic/Immunologic: No itchy/runny eyes, nasal congestion, recent allergic reactions, rashes  ALLERGIES: No Known Allergies  HOME MEDICATIONS:  Current Outpatient Medications:  .  metoprolol tartrate (LOPRESSOR) 50 MG tablet, Take 1 tablet (50 mg total) by mouth 2 (two) times daily.,  Disp: 60 tablet, Rfl: 5 .  natalizumab (TYSABRI) 300 MG/15ML injection, Inject 300 mg into the vein every 28 (twenty-eight) days., Disp: , Rfl:  .  omeprazole (PRILOSEC) 20 MG capsule, Take 20 mg by mouth daily., Disp: , Rfl:  .  amphetamine-dextroamphetamine (ADDERALL) 15 MG tablet, Take 1 tablet by mouth daily., Disp: 60 tablet, Rfl: 0 .  clomiPHENE (CLOMID) 50 MG tablet, 1/2 tab daily (Patient not taking: Reported on 02/25/2018), Disp: 30 tablet, Rfl: 0 .  Dimethyl Fumarate 240 MG CPDR, Take 240 mg by mouth 2 (two) times daily. , Disp: , Rfl:  .  DULoxetine (CYMBALTA) 60 MG capsule, Take 1 capsule (60 mg total) by mouth daily. (Patient not taking: Reported on 02/25/2018), Disp: 30 capsule, Rfl: 11 No current facility-administered medications for this visit.   Facility-Administered Medications Ordered in Other Visits:  .  gadopentetate dimeglumine (MAGNEVIST) injection 20 mL, 20 mL, Intravenous, Once PRN, Laval Cafaro, Pearletha Furl, MD  PAST MEDICAL HISTORY: Past Medical History:  Diagnosis Date  . Atrial fibrillation (HCC)   . Hypertension   . MS (multiple sclerosis) (HCC)   . Vision abnormalities     PAST SURGICAL HISTORY: Past Surgical History:  Procedure Laterality Date  . CERVICAL FUSION  2005, 2014    FAMILY HISTORY: Family History  Problem Relation Age of Onset  . Lupus Mother   . Healthy Father   . Lymphoma Maternal Grandfather   . Colon cancer Paternal Grandmother   . Kidney cancer Neg Hx   . Bladder Cancer Neg Hx   . Prostate cancer Neg Hx     SOCIAL HISTORY:  Social History   Socioeconomic History  . Marital status: Married    Spouse name: Not on  file  . Number of children: Not on file  . Years of education: Not on file  . Highest education level: Not on file  Occupational History  . Not on file  Social Needs  . Financial resource strain: Not on file  . Food insecurity:    Worry: Not on file    Inability: Not on file  . Transportation needs:    Medical: Not  on file    Non-medical: Not on file  Tobacco Use  . Smoking status: Former Smoker    Last attempt to quit: 2003    Years since quitting: 16.9  . Smokeless tobacco: Never Used  Substance and Sexual Activity  . Alcohol use: Yes    Comment: social  . Drug use: No  . Sexual activity: Not on file  Lifestyle  . Physical activity:    Days per week: Not on file    Minutes per session: Not on file  . Stress: Not on file  Relationships  . Social connections:    Talks on phone: Not on file    Gets together: Not on file    Attends religious service: Not on file    Active member of club or organization: Not on file    Attends meetings of clubs or organizations: Not on file    Relationship status: Not on file  . Intimate partner violence:    Fear of current or ex partner: Not on file    Emotionally abused: Not on file    Physically abused: Not on file    Forced sexual activity: Not on file  Other Topics Concern  . Not on file  Social History Narrative  . Not on file     PHYSICAL EXAM  Vitals:   02/25/18 1046  BP: (!) 132/94  Pulse: 71  Weight: 280 lb 8 oz (127.2 kg)  Height: 6\' 1"  (1.854 m)    Body mass index is 37.01 kg/m.   General: The patient is well-developed and well-nourished and in no acute distress  Vision:   20/200 OS and 20/30 OD   Neurologic Exam  Mental status:    The patient is alert and oriented x 3 at the time of the examination. The patient has apparent normal recent and remote memory, with an apparently normal attention span and concentration ability.   Speech is normal.  Cranial nerves: Extraocular movements are full. The pupils show a 2+ left APD.    Colors are desaturated out of the left eye and he reduced ability to read across the room.    Facial strength is normal.  Trapezius and sternocleidomastoid strength is normal. No dysarthria is noted.  The tongue is midline, and the patient has symmetric elevation of the soft palate. No obvious hearing  deficits are noted.  Motor:  Muscle bulk is normal.   Tone is normal. Strength is  5 / 5 on the right but 4/5 in the left leg and he has reduced rapid alternate movements in the left hand  Sensory: Sensory testing is intact on the right.  He reports reduced vibration sensation, touch and temperature in the left arm and left leg.   Coordination: Cerebellar testing shows normal finger-nose-finger but reduced left heel-to-shin  Gait and station: Station is normal.   Gait is stiff and mildly wide . There is a mildl left foot drop. Tandem walk is very wide.    Reflexes: Deep tendon reflexes are symmetric and increased at the knees with spread. No ankle clonus.  DIAGNOSTIC DATA (LABS, IMAGING, TESTING) - I reviewed patient records, labs, notes, testing and imaging myself where available.  Lab Results  Component Value Date   WBC 5.6 03/21/2017   HGB 14.6 03/21/2017   HCT 44.2 03/21/2017   MCV 87 03/21/2017   PLT 222 03/21/2017      Component Value Date/Time   NA 141 01/20/2017 0311   K 3.2 (L) 01/20/2017 0311   CL 106 01/20/2017 0311   CO2 21 (L) 01/20/2017 0311   GLUCOSE 163 (H) 01/20/2017 0311   BUN 15 01/20/2017 0311   CREATININE 1.13 01/20/2017 0311   CALCIUM 9.1 01/20/2017 0311   PROT 6.7 03/21/2017 0914   ALBUMIN 4.7 03/21/2017 0914   AST 10 03/21/2017 0914   ALT 12 03/21/2017 0914   ALKPHOS 46 03/21/2017 0914   BILITOT 0.5 03/21/2017 0914   GFRNONAA >60 01/20/2017 0311   GFRAA >60 01/20/2017 0311   Lab Results  Component Value Date   CHOL 197 07/02/2016   HDL 29 (L) 07/02/2016   LDLCALC 127 (H) 07/02/2016   TRIG 206 (H) 07/02/2016   CHOLHDL 6.8 07/02/2016   Lab Results  Component Value Date   HGBA1C 5.1 07/02/2016        ASSESSMENT AND PLAN  Left optic neuritis  Multiple sclerosis (HCC) - Plan: MR BRAIN W WO CONTRAST, MR CERVICAL SPINE W WO CONTRAST  Gait disturbance  Numbness  Attention deficit disorder (ADD) in adult   1.    Continue  Tysabri 300 mg every 4 weeks.  Due to his new symptoms we will check an MRI of the brain and cervical spine to determine if he is having breakthrough activity.  If this is occurring, consider a switch to different disease modifying therapy. 2.  Change Adderall to 15 mg IR twice a day.  Continue duloxetine. 3.   Oxybutynin nightly and up to twice a day to help with bladder issues  4. . Return in 6 months but sooner if there are new or worsening symptoms.    Armoni Depass A. Epimenio Foot, MD, PhD 02/25/2018, 11:29 AM Dir., [facility]  Certified in Neurology, Clinical Neurophysiology, Sleep Medicine, Pain Medicine and Neuroimaging   [facility] [address], [address] [place], [place] [address] [phone]/

## 2018-03-03 ENCOUNTER — Telehealth: Payer: Self-pay | Admitting: Neurology

## 2018-03-03 NOTE — Telephone Encounter (Signed)
[employer] Auth: [id number] (exp. 03/03/18 to 04/01/18) order sent to GI. They will reach out to the pt to schedule.

## 2018-03-28 ENCOUNTER — Ambulatory Visit
Admission: RE | Admit: 2018-03-28 | Discharge: 2018-03-28 | Disposition: A | Discharge status: Home / Self Care | Payer: BLUE CROSS/BLUE SHIELD | Source: Ambulatory Visit | Attending: Neurology | Admitting: Neurology

## 2018-03-28 DIAGNOSIS — G35 Multiple sclerosis: Secondary | ICD-10-CM

## 2018-03-28 MED ORDER — GADOBENATE DIMEGLUMINE 529 MG/ML IV SOLN
20.0000 mL | Freq: Once | INTRAVENOUS | Status: AC | PRN
  Administered 2018-03-28: 20 mL via INTRAVENOUS

## 2018-03-30 ENCOUNTER — Telehealth: Payer: Self-pay | Admitting: *Deleted

## 2018-03-30 NOTE — Telephone Encounter (Signed)
Noted  

## 2018-03-30 NOTE — Telephone Encounter (Signed)
Advised patient of previous message that MRI unchanged from previous studies.

## 2018-03-30 NOTE — Telephone Encounter (Signed)
Called, LVM for pt to call about results.   Ok to inform pt MRI's unchanged from previous studies

## 2018-03-30 NOTE — Telephone Encounter (Signed)
-----   Message from Asa Lente, MD sent at 03/28/2018  4:40 PM EST ----- Please let him know that the MRI of the brain and cervical spine are unchanged compared to his previous studies.

## 2018-04-08 ENCOUNTER — Other Ambulatory Visit: Payer: Self-pay | Admitting: Neurology

## 2018-04-08 MED ORDER — AMPHETAMINE-DEXTROAMPHETAMINE 15 MG PO TABS: 15.0000 mg | ORAL_TABLET | Freq: Two times a day (BID) | ORAL | 0 refills | Status: DC

## 2018-04-08 NOTE — Telephone Encounter (Signed)
Pt is calling for a refill for his amphetamine-dextroamphetamine (ADDERALL) 15 MG tablet to be called in to the [facility] on [address] Also transferred call the Infusion Suite.

## 2018-04-08 NOTE — Telephone Encounter (Signed)
Checked drug registry. He last refilled 02/25/18 #30. Rx was changed to adderall IR 15mg  twice daily at last visit on 02/25/18. His next f/u is 08/26/18. Refill request sent to MD to escribe.

## 2018-05-04 ENCOUNTER — Encounter: Payer: Self-pay | Admitting: Neurology

## 2018-05-04 ENCOUNTER — Telehealth: Payer: Self-pay | Admitting: Neurology

## 2018-05-04 ENCOUNTER — Ambulatory Visit (INDEPENDENT_AMBULATORY_CARE_PROVIDER_SITE_OTHER): Payer: BLUE CROSS/BLUE SHIELD | Admitting: Neurology

## 2018-05-04 VITALS — BP 147/102 | HR 85 | Wt 262.5 lb

## 2018-05-04 DIAGNOSIS — R269 Unspecified abnormalities of gait and mobility: Secondary | ICD-10-CM

## 2018-05-04 DIAGNOSIS — Z79899 Other long term (current) drug therapy: Secondary | ICD-10-CM

## 2018-05-04 DIAGNOSIS — G35 Multiple sclerosis: Secondary | ICD-10-CM | POA: Diagnosis not present

## 2018-05-04 DIAGNOSIS — R200 Anesthesia of skin: Secondary | ICD-10-CM

## 2018-05-04 DIAGNOSIS — R531 Weakness: Secondary | ICD-10-CM | POA: Insufficient documentation

## 2018-05-04 DIAGNOSIS — F988 Other specified behavioral and emotional disorders with onset usually occurring in childhood and adolescence: Secondary | ICD-10-CM

## 2018-05-04 NOTE — Progress Notes (Signed)
Gave signed order for IV solumedrol 1Gx5 days to intrafusion [facility]).

## 2018-05-04 NOTE — Telephone Encounter (Signed)
I called pt back. He states for the past 3 days sx have worsened. He woke up Saturday morning around 3am. Hard for him to move left leg. Having balance issues, vision problems in left eye, having to use cane d/t balance problems. Having increased fatigue, pins/needles/burning pain in left leg. Denies any signs of infection. Last Tysabri infusion a few weeks ago. Scheduled for next infusion 05/11/18 or 05/12/18. He tried going to work today but went home.   I scheduled him a work in visit today at 2:30pm. Advised him to check in at 2:00pm. I spoke with Dr. Epimenio Foot, he was ok to work him in.

## 2018-05-04 NOTE — Progress Notes (Signed)
[facility]  PATIENT: Sean MatinBrian P Espinoza DOB: 08/17/1975  REFERRING DOCTOR OR PCP:  Referred by Dr. Thad Rangereynolds   SOURCE: paitent, ED notes, imaging reports, lab reports, MRI images on PACS  _________________________________   HISTORICAL  CHIEF COMPLAINT:  Chief Complaint  Patient presents with  . Follow-up    RM 12, alone. Last seen 02/25/18. Here to be evaluated for possible MS exacerbation. He works 70-80 hr per week at work. Wants to discuss STD. He is a Production designer, theatre/television/filmmanager.   . Multiple Sclerosis    For the past 3 days sx have worsened. He woke up Saturday morning around 3am. Hard for him to move left leg. Having balance issues, vision problems in left eye, having to use cane d/t balance problems. Having increased fatigue, pins/needles/burning pain in left leg. Denies any signs of infection. Last Tysabri infusion a few weeks ago. Scheduled for next infusion 05/11/18 or 05/12/18. He tried going to work today but went home.   Donnamarie Poag. Tysabri    Last JCV 11/12/17, negative, titer 0.28. Needs repeat JCV lab today    HISTORY OF PgRESENT ILLNESS:  Sean Espinoza is a 43 y.o. man  diagnosed with multiple sclerosis  in early 2018.    Update 05/04/2018: He is on Tysabri and has tolerated it well.   The last JCV Ab was negative 10/2017.     He has noted more issues with gait x 2 weeks.   He woke up 3 days ago with left leg weakness that has persisted.  He fell getting out of bed on Saturday.    His balance is off since Saturday.   He notes numbness/tingling x 6 weeks.   Also vision is worse out of the last eye.    Fatigue is also a lot worse the past 6 weeks.    He is sleeping well but only gives himself about 6 hours of bedtime each day.     He is not noting new bladder issues.   Oxybutynin helped the urinary frequency/urgency.     He stands at work and also has to [employer].   He has been working up to 60-80 hours most weeks the past year and has more stress,   He was unable to work  today.      Update 02/25/2018: He is on Tysabri since he had an exacerbation 04/2017.   He is tolerating it well.    JCV Ab ws as negative   He feels his gait is more off balanced and he needs to catch himself more.   Strength is similar.  He gets a tingling sensation in his feet, left > right.    He notes the tingling more now than in the past.   He notes welling in his hands and they seem to freeze up if he is not constantly moving them .    He feels bladder frequency has increased and he has nocturia at least 4 times a night.    He has never been on medication.   He has no hesitancy.     He has reduced OS vision since diagnosis.     He notes some reduced hearing.      He feels his mood has been up and down a lot this year..   Duloxetine has not helped.   He has decreased focus/attention and fatigue.    Adderall seemed to help at first but less so now.    He no longer sees a difference when he  takes it compared to a day when he does not.    He feels he gets 5-8 hours of sleep.      Update 04/02/2017:  He began to experience left-sided arm and leg weakness and dysesthesias in his feet and blurry vision in the left eye about one week ago.  Of note, his initial exacerbation with MS left optic neuritis. Vision improved but never to 20/20. Today, he is 20/200 in the left eye.  He had had some difficulty with left sided numbness but feels his current symptoms are significantly worse.   He continues to take the Tecfidera although he has excessive flushing. At the last visit we had discussed other options for treatment.   He was leaning towards Tysabri or Gilenya. We discussed both of these options in more detail. The JCV antibody is negative. Therefore, my recommendation for him is to switch to Tysabri.   Update 03/21/2017:  He is on Tecfidera 240 mg twice a day. He is experiencing flushing with almost every dose a couple hours after he takes it. This can be severe at times. He is taking the medication with food  and has tried aspirin without much success.   His MS is mostly stable and there has been no exacerbation.   He has noted some more tinnitus in the left ear and the left eye seems to be more blurry.    Left ON was his presenting symptom and he feesl he is doing much worse out of that eye.   Right eye vision is fine.   Colors are desaturated.   Fatigue is more of a problem.  Adderall innitially helped but less so now.     Gait is ok..   The left leg goes numb easily.  He also notes mild left leg weakness.    He is more irritable and gets aggravated.   HE takes Zoloft for mild depression.   He has some decreased focus and attention, helped by the Adderall.  From 11/05/2016: MS:    He is tolerating Tecfidera ok.  No mor eflushing    He denies any significant GI issues.   He has GERD and that is unchanged.    No new exacerbation  Cognition:    A couple weeks ago, while on a teleconference, he was having a lot of difficulties getting the right words out. He felt he was speaking gibberish.   He was getting words out but they were jumbled and out of order.  After a few minutes the call was stopped and he feels this went on x 10 minutes.  He notes losing his train of thought in conversation.   MMSE today is 29/30.    He understood everything and he thought he was talking normally but his supervisor noted the changes.    He has noted decreased focus and attention.  He has had this for a while but it seems worse,   He notes missing turns and driving past where he was supposed to go a few times.     Left Sciatica:   He is noting continued lower back and leg pain.  Pain is worse first thing in the morning and is better after he's walked some. Pain started May 2018.   He notes some numbness and tingling in the left leg.   He feels weight bearing is worse on that leg.   He has no h/o lumbar spine issues.   He only notes mild buttock pain when sitting.  However, pain goes down the left leg when sitting on the left cheek.    A  TPI at the last visit only helped x 1 day only.    MS:   He is tolerating Tecfidera ok.  He has had some flushing.    He denies any significant GI issues.   He has GERD and that is unchanged.    No new exacerbation  Gait/strength/sensation   He reports that his gait is doing well. He he denies any stumbling or falls. He notes no major difficulty with strength or sensation in the arms or legs.   The numbness that he was having on the left side has resolved.  Bladder: He denies any significant problem with bladder urgency or frequency or hesitancy. He does not have any incontinence.  Vision: He denies any difficulty with visual acuity, diplopia or eye pain.  Fatigue/sleep:  He notes mild fatigue, physical more than cognitive. Fatigue is worse with heat.[name]  He is sleeping worse since the leg pain started.   .  Mood:    He notes more irritability but no change in depression.   He is on Zoloft.  X 6 years, more for anxiety than depression.    MS History:   In mid March, he had the onset of blurry vision out of the left eye. He noted that the visual field was worse on the left side than the right side. Additionally he was unable to read out of the left eye. Colors were mildly desaturated. On April 2, he had the onset of left facial numbness. Over the next day the numbness increased to involve the entire left side, worse in the arm than the leg. He also noted mild weakness in the left arm and reduced balance and gait.[name] He presented to [facility] and an MRI 07/02/2016 showed many T2/FLAIR hyperintense foci, four foci  enhanced  (right frontal subcortical white matter, right corpus callosum, left parietal subcortical white matter and left mesial temporal lobe), consistent with the diagnosis of MS. He was admitted and received 3 days of IV Solu-Medrol. His symptoms improved some and he was discharged.  He was referred to me and Gilford Raid was started in March 2018.     REVIEW OF SYSTEMS: Constitutional: No  fevers, chills, sweats, or change in appetite.   Notes fatigue. Eyes: No visual changes, double vision, eye pain Ear, nose and throat: No hearing loss, ear pain, nasal congestion, sore throat Cardiovascular: No chest pain, palpitations Respiratory: No shortness of breath at rest or with exertion.   No wheezes GastrointestinaI: No nausea, vomiting, diarrhea, abdominal pain, fecal incontinence Genitourinary: No dysuria, urinary retention or frequency.  No nocturia. Musculoskeletal: No neck pain, back pain Integumentary: No rash, pruritus, skin lesions Neurological: as above Psychiatric: No depression at this time.  Mild anxiety Endocrine: No palpitations, diaphoresis, change in appetite, change in weigh or increased thirst Hematologic/Lymphatic: No anemia, purpura, petechiae. Allergic/Immunologic: No itchy/runny eyes, nasal congestion, recent allergic reactions, rashes  ALLERGIES: No Known Allergies  HOME MEDICATIONS:  Current Outpatient Medications:  .  amphetamine-dextroamphetamine (ADDERALL) 15 MG tablet, Take 1 tablet by mouth 2 (two) times daily., Disp: 60 tablet, Rfl: 0 .  DULoxetine (CYMBALTA) 60 MG capsule, Take 1 capsule (60 mg total) by mouth daily., Disp: 30 capsule, Rfl: 11 .  metoprolol tartrate (LOPRESSOR) 50 MG tablet, Take 1 tablet (50 mg total) by mouth 2 (two) times daily., Disp: 60 tablet, Rfl: 5 .  natalizumab (TYSABRI) 300 MG/15ML injection, Inject  300 mg into the vein every 28 (twenty-eight) days., Disp: , Rfl:  .  omeprazole (PRILOSEC) 20 MG capsule, Take 20 mg by mouth daily., Disp: , Rfl:  .  oxybutynin (DITROPAN) 5 MG tablet, Take 1 tablet (5 mg total) by mouth 2 (two) times daily., Disp: 60 tablet, Rfl: 5 No current facility-administered medications for this visit.   Facility-Administered Medications Ordered in Other Visits:  .  gadopentetate dimeglumine (MAGNEVIST) injection 20 mL, 20 mL, Intravenous, Once PRN, Verena Shawgo, Pearletha Furl, MD  PAST MEDICAL  HISTORY: Past Medical History:  Diagnosis Date  . Atrial fibrillation (HCC)   . Hypertension   . MS (multiple sclerosis) (HCC)   . Vision abnormalities     PAST SURGICAL HISTORY: Past Surgical History:  Procedure Laterality Date  . CERVICAL FUSION  2005, 2014    FAMILY HISTORY: Family History  Problem Relation Age of Onset  . Lupus Mother   . Healthy Father   . Lymphoma Maternal Grandfather   . Colon cancer Paternal Grandmother   . Kidney cancer Neg Hx   . Bladder Cancer Neg Hx   . Prostate cancer Neg Hx     SOCIAL HISTORY:  Social History   Socioeconomic History  . Marital status: Married    Spouse name: Not on file  . Number of children: Not on file  . Years of education: Not on file  . Highest education level: Not on file  Occupational History  . Not on file  Social Needs  . Financial resource strain: Not on file  . Food insecurity:    Worry: Not on file    Inability: Not on file  . Transportation needs:    Medical: Not on file    Non-medical: Not on file  Tobacco Use  . Smoking status: Former Smoker    Last attempt to quit: 2003    Years since quitting: 17.1  . Smokeless tobacco: Never Used  Substance and Sexual Activity  . Alcohol use: Yes    Comment: social  . Drug use: No  . Sexual activity: Not on file  Lifestyle  . Physical activity:    Days per week: Not on file    Minutes per session: Not on file  . Stress: Not on file  Relationships  . Social connections:    Talks on phone: Not on file    Gets together: Not on file    Attends religious service: Not on file    Active member of club or organization: Not on file    Attends meetings of clubs or organizations: Not on file    Relationship status: Not on file  . Intimate partner violence:    Fear of current or ex partner: Not on file    Emotionally abused: Not on file    Physically abused: Not on file    Forced sexual activity: Not on file  Other Topics Concern  . Not on file  Social  History Narrative  . Not on file     PHYSICAL EXAM  Vitals:   05/04/18 1434  BP: (!) 147/102  Pulse: 85  Weight: 262 lb 8 oz (119.1 kg)    Body mass index is 34.63 kg/m.   General: The patient is well-developed and well-nourished and in no acute distress  Vision:   20/200 OS and 20/30 OD   Neurologic Exam  Mental status:    The patient is alert and oriented x 3 at the time of the examination. The patient has apparent  normal recent and remote memory, with an apparently normal attention span and concentration ability.   Speech is normal.  Cranial nerves: Extraocular movements are full. The pupils show a 2+ left APD.    Colors are desaturated out of the left eye and he reduced ability to read across the room.    Facial strength is normal.  Trapezius and sternocleidomastoid strength is normal. No dysarthria is noted.  The tongue is midline, and the patient has symmetric elevation of the soft palate. No obvious hearing deficits are noted.  Motor:  Muscle bulk is normal.   Tone is normal. Strength is  5 / 5 on the right.   He has very reduced rapid rotating movements of the left arm.  Strength was 4/5 in the left arm, 4+/5 in the proximal left leg and 4/5 in the distal left leg/ankle/foot.  Sensory: Sensory testing is intact on the right.  He has reduced touch, temperature and vibration sensation in the left arm and leg..   Coordination: Cerebellar testing shows slightly reduced left nose finger and very reduced left heel-to-shin.  Gait and station: Station is normal.   He has a left foot drop.  Tandem gait was wide.  The Romberg was positive.   Reflexes: Deep tendon reflexes are symmetric and increased at the knees with spread. No ankle clonus.       DIAGNOSTIC DATA (LABS, IMAGING, TESTING) - I reviewed patient records, labs, notes, testing and imaging myself where available.  Lab Results  Component Value Date   WBC 5.6 03/21/2017   HGB 14.6 03/21/2017   HCT 44.2 03/21/2017    MCV 87 03/21/2017   PLT 222 03/21/2017      Component Value Date/Time   NA 141 01/20/2017 0311   K 3.2 (L) 01/20/2017 0311   CL 106 01/20/2017 0311   CO2 21 (L) 01/20/2017 0311   GLUCOSE 163 (H) 01/20/2017 0311   BUN 15 01/20/2017 0311   CREATININE 1.13 01/20/2017 0311   CALCIUM 9.1 01/20/2017 0311   PROT 6.7 03/21/2017 0914   ALBUMIN 4.7 03/21/2017 0914   AST 10 03/21/2017 0914   ALT 12 03/21/2017 0914   ALKPHOS 46 03/21/2017 0914   BILITOT 0.5 03/21/2017 0914   GFRNONAA >60 01/20/2017 0311   GFRAA >60 01/20/2017 0311   Lab Results  Component Value Date   CHOL 197 07/02/2016   HDL 29 (L) 07/02/2016   LDLCALC 127 (H) 07/02/2016   TRIG 206 (H) 07/02/2016   CHOLHDL 6.8 07/02/2016   Lab Results  Component Value Date   HGBA1C 5.1 07/02/2016        ASSESSMENT AND PLAN  Multiple sclerosis (HCC) - Plan: MR BRAIN W WO CONTRAST, MR CERVICAL SPINE W WO CONTRAST, Hepatitis B core antibody, total, Hepatitis B surface antigen, HIV Antibody (routine testing w rflx), IgG, IgA, IgM, QuantiFERON-TB Gold Plus, Hepatitis B surface antibody,qualitative, Comprehensive metabolic panel, CBC with Differential/Platelet, Thyroid Panel With TSH, Varicella zoster antibody, IgG, Urinalysis, Routine w reflex microscopic  High risk medication use - Plan: Hepatitis B core antibody, total, Hepatitis B surface antigen, HIV Antibody (routine testing w rflx), IgG, IgA, IgM, QuantiFERON-TB Gold Plus, Hepatitis B surface antibody,qualitative, Comprehensive metabolic panel, CBC with Differential/Platelet, Thyroid Panel With TSH, Varicella zoster antibody, IgG, Urinalysis, Routine w reflex microscopic  Gait disturbance  Numbness  Attention deficit disorder (ADD) in adult  Left-sided weakness   1.    IV Solumedrol 3-5 days 1 g/day.   We discussed some other option as  a DMT including Ocrevus, Lemtrada, Mayzent.      He was most interested in [place] with Lemtrada as a backup and we will check blood  work 2.  Continue Adderall.  Continue duloxetine and oxybutynon 3.  I wrote a note excusing him from work for the next 2 weeks.  This can be modified based on his response to the steroids.   4.   He will return this week for more IV steroid.  Return in 6 months for regular office visit but sooner if there are new or worsening symptoms or based on lab results.  40-minute face-to-face evaluation with greater than one half the time counseling coordinating care about his breakthrough MS activity despite being on a generally very efficacious medication and discussing other disease modifying therapy options.  Nakyia Dau A. Epimenio Foot, MD, PhD 05/04/2018, 6:20 PM Dir., [facility]  Certified in Neurology, Clinical Neurophysiology, Sleep Medicine, Pain Medicine and Neuroimaging   [facility] [address] [place], [place] [address] [phone]/

## 2018-05-04 NOTE — Telephone Encounter (Signed)
Pt feels he is having exacerbation. Trouble moving left leg, feels like it is on fire, having to use cane for mobilty, blurred vision in left eye. Pt is wanting to be seen today. Please call to advise.

## 2018-05-06 ENCOUNTER — Telehealth: Payer: Self-pay | Admitting: Neurology

## 2018-05-06 LAB — THYROID PANEL WITH TSH
Free Thyroxine Index: 1.5 (ref 1.2–4.9)
T3 Uptake Ratio: 24 % (ref 24–39)
T4 TOTAL: 6.2 ug/dL (ref 4.5–12.0)
TSH: 1.62 u[IU]/mL (ref 0.450–4.500)

## 2018-05-06 LAB — CBC WITH DIFFERENTIAL/PLATELET
Basophils Absolute: 0.1 10*3/uL (ref 0.0–0.2)
Basos: 1 %
EOS (ABSOLUTE): 0.4 10*3/uL (ref 0.0–0.4)
Eos: 5 %
HEMATOCRIT: 43.3 % (ref 37.5–51.0)
Hemoglobin: 14.9 g/dL (ref 13.0–17.7)
Immature Grans (Abs): 0.1 10*3/uL (ref 0.0–0.1)
Immature Granulocytes: 1 %
Lymphocytes Absolute: 3.4 10*3/uL — ABNORMAL HIGH (ref 0.7–3.1)
Lymphs: 43 %
MCH: 28.7 pg (ref 26.6–33.0)
MCHC: 34.4 g/dL (ref 31.5–35.7)
MCV: 83 fL (ref 79–97)
Monocytes Absolute: 0.5 10*3/uL (ref 0.1–0.9)
Monocytes: 7 %
NRBC: 1 % — ABNORMAL HIGH (ref 0–0)
Neutrophils Absolute: 3.4 10*3/uL (ref 1.4–7.0)
Neutrophils: 43 %
Platelets: 275 10*3/uL (ref 150–450)
RBC: 5.19 x10E6/uL (ref 4.14–5.80)
RDW: 14.6 % (ref 11.6–15.4)
WBC: 7.8 10*3/uL (ref 3.4–10.8)

## 2018-05-06 LAB — COMPREHENSIVE METABOLIC PANEL
ALT: 14 IU/L (ref 0–44)
AST: 10 IU/L (ref 0–40)
Albumin/Globulin Ratio: 3 — ABNORMAL HIGH (ref 1.2–2.2)
Albumin: 4.8 g/dL (ref 4.0–5.0)
Alkaline Phosphatase: 67 IU/L (ref 39–117)
BUN/Creatinine Ratio: 11 (ref 9–20)
BUN: 10 mg/dL (ref 6–24)
Bilirubin Total: 0.4 mg/dL (ref 0.0–1.2)
CO2: 23 mmol/L (ref 20–29)
Calcium: 9.5 mg/dL (ref 8.7–10.2)
Chloride: 103 mmol/L (ref 96–106)
Creatinine, Ser: 0.92 mg/dL (ref 0.76–1.27)
GFR calc Af Amer: 120 mL/min/{1.73_m2} (ref >59)
GFR calc non Af Amer: 104 mL/min/{1.73_m2} (ref >59)
GLOBULIN, TOTAL: 1.6 g/dL (ref 1.5–4.5)
Glucose: 96 mg/dL (ref 65–99)
Potassium: 3.4 mmol/L — ABNORMAL LOW (ref 3.5–5.2)
Sodium: 142 mmol/L (ref 134–144)
Total Protein: 6.4 g/dL (ref 6.0–8.5)

## 2018-05-06 LAB — HIV ANTIBODY (ROUTINE TESTING W REFLEX): HIV Screen 4th Generation wRfx: NONREACTIVE

## 2018-05-06 LAB — URINALYSIS, ROUTINE W REFLEX MICROSCOPIC
Bilirubin, UA: NEGATIVE
Glucose, UA: NEGATIVE
Ketones, UA: NEGATIVE
Leukocytes, UA: NEGATIVE
Nitrite, UA: NEGATIVE
Protein, UA: NEGATIVE
RBC, UA: NEGATIVE
SPEC GRAV UA: 1.025 (ref 1.005–1.030)
Urobilinogen, Ur: 0.2 mg/dL (ref 0.2–1.0)
pH, UA: 5 (ref 5.0–7.5)

## 2018-05-06 LAB — QUANTIFERON-TB GOLD PLUS
QUANTIFERON MITOGEN VALUE: 6.11 [IU]/mL
QUANTIFERON TB2 AG VALUE: 0.05 [IU]/mL
QuantiFERON Nil Value: 0.04 IU/mL
QuantiFERON TB1 Ag Value: 0.04 IU/mL
QuantiFERON-TB Gold Plus: NEGATIVE

## 2018-05-06 LAB — IGG, IGA, IGM
IGA/IMMUNOGLOBULIN A, SERUM: 136 mg/dL (ref 90–386)
IGG (IMMUNOGLOBIN G), SERUM: 571 mg/dL — AB (ref 700–1600)
IgM (Immunoglobulin M), Srm: 30 mg/dL (ref 20–172)

## 2018-05-06 LAB — HEPATITIS B SURFACE ANTIGEN: Hepatitis B Surface Ag: NEGATIVE

## 2018-05-06 LAB — HEPATITIS B SURFACE ANTIBODY,QUALITATIVE: Hep B Surface Ab, Qual: NONREACTIVE

## 2018-05-06 LAB — VARICELLA ZOSTER ANTIBODY, IGG: Varicella zoster IgG: >4000 index (ref >165)

## 2018-05-06 LAB — HEPATITIS B CORE ANTIBODY, TOTAL: Hep B Core Total Ab: NEGATIVE

## 2018-05-06 NOTE — Telephone Encounter (Signed)
MR Brain w/wo contrast & MR Cervical spine w/wo contrast Dr. Gershon Mussel Auth: [id number] (exp. 05/05/18 to 06/03/18). Patient is scheduled at [facility] for 05/13/18.

## 2018-05-12 ENCOUNTER — Telehealth: Payer: Self-pay | Admitting: Neurology

## 2018-05-12 NOTE — Telephone Encounter (Signed)
Called pt back. Heceived 3 days IV steroids 05/04/18-05/06/18. Dr. Epimenio Foot had ordered 5 days IV steroid. However, he did not get the other two days d/t face turning red, sweating. He has been able to tolerate in the past but this time he had these reactions. He states sx have worsened again. Similar to what he had prior to 3 days IV steroids. He is wanting to come in again for steroids even though he had above reactions. Advised I will speak with Dr. Epimenio Foot and call back.

## 2018-05-12 NOTE — Telephone Encounter (Signed)
Spoke with Dr. Epimenio Foot- ok to have him come in for 2 more days of IV steroids.

## 2018-05-12 NOTE — Telephone Encounter (Signed)
I called pt back. He will head this way now. He will be here about 12:30pm. I gave signed orders to Gina ([facility]) for 1G IV solumedrol x2 days.

## 2018-05-12 NOTE — Telephone Encounter (Signed)
Pt called to inform us that he is needing more steroids to get rid of the flare up. Please advise.

## 2018-05-13 ENCOUNTER — Telehealth: Payer: Self-pay | Admitting: *Deleted

## 2018-05-13 ENCOUNTER — Ambulatory Visit: Payer: BLUE CROSS/BLUE SHIELD

## 2018-05-13 DIAGNOSIS — G35 Multiple sclerosis: Secondary | ICD-10-CM

## 2018-05-13 MED ORDER — GADOBENATE DIMEGLUMINE 529 MG/ML IV SOLN
20.0000 mL | Freq: Once | INTRAVENOUS | Status: AC | PRN
  Administered 2018-05-13: 20 mL via INTRAVENOUS

## 2018-05-13 NOTE — Telephone Encounter (Signed)
Gave completed/signed Ocrevus start form to [facility] to process for pt. Per Dr. Epimenio FootSater- labs looked fine to start Ocrevus.

## 2018-05-14 ENCOUNTER — Encounter: Payer: Self-pay | Admitting: *Deleted

## 2018-05-14 ENCOUNTER — Telehealth: Payer: Self-pay | Admitting: *Deleted

## 2018-05-14 DIAGNOSIS — Z0289 Encounter for other administrative examinations: Secondary | ICD-10-CM

## 2018-05-14 NOTE — Telephone Encounter (Signed)
Gave completed/signd FMLA back to [employer] (medical records) to process for pt.  Dr. Epimenio Foot is writing him out of work from 05/04/18-06/12/18 for recent MS exacerbation. He wants to have him try and get initial dose of Ocrevus prior to returning to work. Insurance auth pending with [facility]. Pt requested to return to work 05/29/18 but Dr. Epimenio Foot gave until 06/12/18 to give time for auth/infusion for Ocrevus.

## 2018-05-14 NOTE — Telephone Encounter (Signed)
-----   Message from Asa Lenteichard A Sater, MD sent at 05/13/2018  6:52 PM EST ----- Please let him know that the MRI of the brain and cervical spine did not show any new lesions compared to his previous studies.

## 2018-05-14 NOTE — Telephone Encounter (Signed)
Called pt back. Relayed MRI results per Dr. Epimenio Foot note. He verbalized understanding. He has some concerns about the results.   Wants to know what this means: "Multiple T2/flair hyperintense foci in the hemispheres, cerebellum and brainstem in a pattern and configuration consistent with chronic demyelinating plaque associated with multiple sclerosis".  He states it has not been listed on other MRI reports before and he is concerned about this. Advised I will have Dr. Epimenio Foot f/u with him on this.   He also wanted update on FMLA forms. He dropped off on 05/12/18. I have not received this yet. Advised I will check with Stanton Kidney on this. He would like FMLA extended until 05/29/18. Advised I will have Stanton Kidney contact him once forms completed.

## 2018-05-15 NOTE — Telephone Encounter (Signed)
I spoke to Sean Espinoza and reviewed the MRI results with him.  He will be starting Ocrevus soon.

## 2018-06-03 ENCOUNTER — Telehealth: Payer: Self-pay | Admitting: *Deleted

## 2018-06-03 NOTE — Telephone Encounter (Signed)
Gave addended forms back to Stanton Kidney S in medical records to re-send for pt.

## 2018-06-03 NOTE — Telephone Encounter (Signed)
Received notification from [employer] that Ocrevus approved 05/18/18-11/02/18. Ref#: [id number]. Gave to [facility] for their records.

## 2018-06-11 NOTE — Telephone Encounter (Signed)
Received another notification from [employer] that Ocrevus approved 11/03/18-05/17/19. Ref#: [id number]. Gave to [facility] for their records.

## 2018-06-19 ENCOUNTER — Encounter: Payer: Self-pay | Admitting: *Deleted

## 2018-06-19 ENCOUNTER — Encounter: Payer: Self-pay | Admitting: Neurology

## 2018-06-25 ENCOUNTER — Telehealth: Payer: Self-pay | Admitting: Neurology

## 2018-06-25 NOTE — Telephone Encounter (Signed)
I returned call and spoke with Calleen Alvis/[facility]. Relayed Dr. Bonnita Hollow message. They will update pt file. Nothing further needed.

## 2018-06-25 NOTE — Telephone Encounter (Signed)
[facility] [phone] claim# [id number] is needing to know the end date for self quarantine. Please call to advise

## 2018-06-25 NOTE — Telephone Encounter (Signed)
Dr. Sater- what would you recommend? 

## 2018-06-25 NOTE — Telephone Encounter (Signed)
End date depends on how long the pandemic is going on in the [place].   I can't give a set date but if they insist, make it May 4

## 2018-07-03 ENCOUNTER — Telehealth: Payer: Self-pay | Admitting: Neurology

## 2018-07-03 NOTE — Telephone Encounter (Signed)
He continues to recover from his multiple sclerosis exacerbation.  At some point in May, I believe he could return to most of his duties at work.  I will recommend accommodations of a 15 pound carrying limit and no climbing.  There is a second issue which is harder to address.  Ocrevus is a strong immune O suppressant medication and has been associated with a higher risk of respiratory infections and possibly more significant disease when that occurs.  Therefore, I would want him to avoid contact with anybody who potentially could spread the COVID-19 virus.  I believe a Aug 17, 2018 date is reasonable but this may need to be changed.

## 2018-07-06 NOTE — Telephone Encounter (Signed)
Gave completed/signed disability paperwork back to medical records to process for pt. 

## 2018-08-06 ENCOUNTER — Other Ambulatory Visit: Payer: Self-pay | Admitting: Neurology

## 2018-08-06 MED ORDER — AMPHETAMINE-DEXTROAMPHETAMINE 15 MG PO TABS: 15.0000 mg | ORAL_TABLET | Freq: Two times a day (BID) | ORAL | 0 refills | Status: DC

## 2018-08-06 NOTE — Telephone Encounter (Signed)
Pt is needing a refill on his amphetamine-dextroamphetamine (ADDERALL) 15 MG tablet sent to [facility] on [address] in [place]

## 2018-08-25 ENCOUNTER — Telehealth: Payer: Self-pay | Admitting: *Deleted

## 2018-08-25 NOTE — Telephone Encounter (Signed)
I called pt. Updated med list, pharmacy, allergies for VV tomorrow. Confirmed he received email. I explained steps for [email or web address].

## 2018-08-26 ENCOUNTER — Other Ambulatory Visit: Payer: Self-pay

## 2018-08-26 ENCOUNTER — Ambulatory Visit (INDEPENDENT_AMBULATORY_CARE_PROVIDER_SITE_OTHER): Payer: BLUE CROSS/BLUE SHIELD | Admitting: Neurology

## 2018-08-26 ENCOUNTER — Encounter: Payer: Self-pay | Admitting: Neurology

## 2018-08-26 DIAGNOSIS — H469 Unspecified optic neuritis: Secondary | ICD-10-CM | POA: Diagnosis not present

## 2018-08-26 DIAGNOSIS — G35 Multiple sclerosis: Secondary | ICD-10-CM | POA: Diagnosis not present

## 2018-08-26 DIAGNOSIS — R531 Weakness: Secondary | ICD-10-CM | POA: Diagnosis not present

## 2018-08-26 DIAGNOSIS — R269 Unspecified abnormalities of gait and mobility: Secondary | ICD-10-CM | POA: Diagnosis not present

## 2018-08-26 DIAGNOSIS — F988 Other specified behavioral and emotional disorders with onset usually occurring in childhood and adolescence: Secondary | ICD-10-CM

## 2018-08-26 DIAGNOSIS — R200 Anesthesia of skin: Secondary | ICD-10-CM

## 2018-08-26 MED ORDER — OMEPRAZOLE 40 MG PO CPDR: 40.0000 mg | DELAYED_RELEASE_CAPSULE | Freq: Every day | ORAL | 3 refills | Status: DC

## 2018-08-26 MED ORDER — DALFAMPRIDINE ER 10 MG PO TB12: ORAL_TABLET | ORAL | 5 refills | Status: DC

## 2018-08-26 MED ORDER — AMPHETAMINE-DEXTROAMPHETAMINE 15 MG PO TABS: ORAL_TABLET | ORAL | 0 refills | Status: DC

## 2018-08-26 NOTE — Progress Notes (Signed)
[facility]  PATIENT: Sean Espinoza DOB: [birth year]  REFERRING DOCTOR OR PCP:  Referred by Dr. Thad Ranger   SOURCE: paitent, ED notes, imaging reports, lab reports, MRI images on PACS  _________________________________   HISTORICAL  CHIEF COMPLAINT:  Chief Complaint  Patient presents with   Multiple Sclerosis    HISTORY OF PgRESENT ILLNESS:  Sean Espinoza is a 43 y.o. man  diagnosed with multiple sclerosis  in early 2018.    Update 08/26/2018: Virtual Visit via Video Note I connected with Sean Espinoza  on 08/26/18 at  2:30 PM EDT by a video enabled telemedicine application and verified that I am speaking with the correct person.  I discussed the limitations of evaluation and management by telemedicine and the availability of in person appointments. The patient expressed understanding and agreed to proceed.  History of Present Illness: He started Ocrevus in April and had no side effects.  He denies any new MS symptom but is still very weak on his left side.   He has more pain on the right form overuse.  He has weakness in the left leg > arm and also has numbness arm > leg.   He drops items on the left.   He has difficulty walking and needs to hold on for balance.   Walking without support is slow and tenuous. He can walk 500-600 feet but balance is worse with longer distance.    He fell x 3 at work the past 2 weeks.   He feels strength never really improved much after the exacerbation in February.     Numbness is also the same.   He has trouble standing up if he falls.   He is trying to get by without a cane.   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   He was needing to go up and down ladders but no longer is doing so.   He notes a swollen sensation OS.     He notes that he sometimes has some dribbling with his urine though no frank incontinence.  He has mild fuzziness with his left vision.   He notes reduced  hearing OS.       His left hand is sometimes swollen.     Fatigue seems worse.   He is on Adderall 15 mg po bid and it seemed to help more initially.     Observations/Objective: He is a well-developed well-nourished man in no acute distress.  The head is normocephalic and atraumatic.  Sclera are anicteric.  Visible skin appears normal.  The neck has a good range of motion.  Pharynx and tongue have normal appearance.  He is alert and fully oriented with fluent speech and good attention, knowledge and memory.  Extraocular muscles are intact.  Facial strength is normal.  Palatal elevation and tongue protrusion are midline.  He appears to have normal strength in the arms.  Rapid alternating movements and finger-nose-finger are performed slower on the left than right.   Assessment and Plan: Multiple sclerosis (HCC)  Left-sided weakness  Left optic neuritis  Gait disturbance  Attention deficit disorder (ADD) in adult  Numbness  1.  Continue Ocrevus.  His next infusion will be in about 4 months. 2.  He has back at work.  Due to his impairments, we need to extend the restrictions (15 pound lifting and no ladders). 3.  Increase Adderall to 15 mg 3 times a day. 4.  Trial of Ampyra to see if gait speed and balance improve.  Kidney function was fine on the February labs. 5.   He will return to see me in 4 months or sooner if there are new or worsening neurologic symptoms.  Follow Up Instructions: I discussed the assessment and treatment plan with the patient. The patient was provided an opportunity to ask questions and all were answered. The patient agreed with the plan and demonstrated an understanding of the instructions.    The patient was advised to call back or seek an in-person evaluation if the symptoms worsen or if the condition fails to improve as anticipated.  I provided 25 minutes of non-face-to-face time during this encounter.    Update 05/04/2018: He is on Tysabri and has  tolerated it well.   The last JCV Ab was negative 10/2017.     He has noted more issues with gait x 2 weeks.   He woke up 3 days ago with left leg weakness that has persisted.  He fell getting out of bed on Saturday.    His balance is off since Saturday.   He notes numbness/tingling x 6 weeks.   Also vision is worse out of the last eye.    Fatigue is also a lot worse the past 6 weeks.    He is sleeping well but only gives himself about 6 hours of bedtime each day.     He is not noting new bladder issues.   Oxybutynin helped the urinary frequency/urgency.     He stands at work and also has to [employer].   He has been working up to 60-80 hours most weeks the past year and has more stress,   He was unable to work today.      Update 02/25/2018: He is on Tysabri since he had an exacerbation 04/2017.   He is tolerating it well.    JCV Ab ws as negative   He feels his gait is more off balanced and he needs to catch himself more.   Strength is similar.  He gets a tingling sensation in his feet, left > right.    He notes the tingling more now than in the past.   He notes welling in his hands and they seem to freeze up if he is not constantly moving them .    He feels bladder frequency has increased and he has nocturia at least 4 times a night.    He has never been on medication.   He has no hesitancy.     He has reduced OS vision since diagnosis.     He notes some reduced hearing.      He feels his mood has been up and down a lot this year..   Duloxetine has not helped.   He has decreased focus/attention and fatigue.    Adderall seemed to help at first but less so now.    He no longer sees a difference when he takes it compared to a day when he does not.    He feels he gets 5-8 hours of sleep.      Update 04/02/2017:  He began to experience left-sided arm and leg weakness and dysesthesias in his feet and blurry vision in the left eye about one week ago.  Of note, his initial exacerbation with MS left optic  neuritis. Vision improved but never to 20/20. Today, he is 20/200 in the left eye.  He had had some difficulty with left  sided numbness but feels his current symptoms are significantly worse.   He continues to take the Tecfidera although he has excessive flushing. At the last visit we had discussed other options for treatment.   He was leaning towards Tysabri or Gilenya. We discussed both of these options in more detail. The JCV antibody is negative. Therefore, my recommendation for him is to switch to Tysabri.   Update 03/21/2017:  He is on Tecfidera 240 mg twice a day. He is experiencing flushing with almost every dose a couple hours after he takes it. This can be severe at times. He is taking the medication with food and has tried aspirin without much success.   His MS is mostly stable and there has been no exacerbation.   He has noted some more tinnitus in the left ear and the left eye seems to be more blurry.    Left ON was his presenting symptom and he feesl he is doing much worse out of that eye.   Right eye vision is fine.   Colors are desaturated.   Fatigue is more of a problem.  Adderall innitially helped but less so now.     Gait is ok..   The left leg goes numb easily.  He also notes mild left leg weakness.    He is more irritable and gets aggravated.   HE takes Zoloft for mild depression.   He has some decreased focus and attention, helped by the Adderall.  From 11/05/2016: MS:    He is tolerating Tecfidera ok.  No mor eflushing    He denies any significant GI issues.   He has GERD and that is unchanged.    No new exacerbation  Cognition:    A couple weeks ago, while on a teleconference, he was having a lot of difficulties getting the right words out. He felt he was speaking gibberish.   He was getting words out but they were jumbled and out of order.  After a few minutes the call was stopped and he feels this went on x 10 minutes.  He notes losing his train of thought in conversation.   MMSE  today is 29/30.    He understood everything and he thought he was talking normally but his supervisor noted the changes.    He has noted decreased focus and attention.  He has had this for a while but it seems worse,   He notes missing turns and driving past where he was supposed to go a few times.     Left Sciatica:   He is noting continued lower back and leg pain.  Pain is worse first thing in the morning and is better after he's walked some. Pain started May 2018.   He notes some numbness and tingling in the left leg.   He feels weight bearing is worse on that leg.   He has no h/o lumbar spine issues.   He only notes mild buttock pain when sitting.   However, pain goes down the left leg when sitting on the left cheek.    A TPI at the last visit only helped x 1 day only.    MS:   He is tolerating Tecfidera ok.  He has had some flushing.    He denies any significant GI issues.   He has GERD and that is unchanged.    No new exacerbation  Gait/strength/sensation   He reports that his gait is doing well. He he denies any stumbling  or falls. He notes no major difficulty with strength or sensation in the arms or legs.   The numbness that he was having on the left side has resolved.  Bladder: He denies any significant problem with bladder urgency or frequency or hesitancy. He does not have any incontinence.  Vision: He denies any difficulty with visual acuity, diplopia or eye pain.  Fatigue/sleep:  He notes mild fatigue, physical more than cognitive. Fatigue is worse with heat.[name].  He is sleeping worse since the leg pain started.   .  Mood:    He notes more irritability but no change in depression.   He is on Zoloft.  X 6 years, more for anxiety than depression.    MS History:   In mid March, he had the onset of blurry vision out of the left eye. He noted that the visual field was worse on the left side than the right side. Additionally he was unable to read out of the left eye. Colors were mildly  desaturated. On April 2, he had the onset of left facial numbness. Over the next day the numbness increased to involve the entire left side, worse in the arm than the leg. He also noted mild weakness in the left arm and reduced balance and gait.[name]. He presented to  [facility] and an MRI 07/02/2016 showed many T2/FLAIR hyperintense foci, four foci  enhanced  (right frontal subcortical white matter, right corpus callosum, left parietal subcortical white matter and left mesial temporal lobe), consistent with the diagnosis of MS. He was admitted and received 3 days of IV Solu-Medrol. His symptoms improved some and he was discharged.  He was referred to me and Gilford Raidecfidera was started in March 2018.     REVIEW OF SYSTEMS: Constitutional: No fevers, chills, sweats, or change in appetite.   Notes fatigue. Eyes: No visual changes, double vision, eye pain Ear, nose and throat: No hearing loss, ear pain, nasal congestion, sore throat Cardiovascular: No chest pain, palpitations Respiratory: No shortness of breath at rest or with exertion.   No wheezes GastrointestinaI: No nausea, vomiting, diarrhea, abdominal pain, fecal incontinence Genitourinary: No dysuria, urinary retention or frequency.  No nocturia. Musculoskeletal: No neck pain, back pain Integumentary: No rash, pruritus, skin lesions Neurological: as above Psychiatric: No depression at this time.  Mild anxiety Endocrine: No palpitations, diaphoresis, change in appetite, change in weigh or increased thirst Hematologic/Lymphatic: No anemia, purpura, petechiae. Allergic/Immunologic: No itchy/runny eyes, nasal congestion, recent allergic reactions, rashes  ALLERGIES: No Known Allergies  HOME MEDICATIONS:  Current Outpatient Medications:    amphetamine-dextroamphetamine (ADDERALL) 15 MG tablet, Take one po three times a day, Disp: 90 tablet, Rfl: 0   dalfampridine 10 MG TB12, One po q 12 hours, Disp: 60 tablet, Rfl: 5   metoprolol tartrate  (LOPRESSOR) 50 MG tablet, Take 1 tablet (50 mg total) by mouth 2 (two) times daily., Disp: 60 tablet, Rfl: 5   ocrelizumab (OCREVUS) 300 MG/10ML injection, Inject into the vein once., Disp: , Rfl:    omeprazole (PRILOSEC) 40 MG capsule, Take 1 capsule (40 mg total) by mouth daily., Disp: 90 capsule, Rfl: 3 No current facility-administered medications for this visit.   Facility-Administered Medications Ordered in Other Visits:    gadopentetate dimeglumine (MAGNEVIST) injection 20 mL, 20 mL, Intravenous, Once PRN, Kevion Fatheree, Pearletha Furlichard A, MD  PAST MEDICAL HISTORY: Past Medical History:  Diagnosis Date   Atrial fibrillation (HCC)    Hypertension    MS (multiple sclerosis) (HCC)    Vision abnormalities  PAST SURGICAL HISTORY: Past Surgical History:  Procedure Laterality Date   CERVICAL FUSION  2005, 2014    FAMILY HISTORY: Family History  Problem Relation Age of Onset   Lupus Mother    Healthy Father    Lymphoma Maternal Grandfather    Colon cancer Paternal Grandmother    Kidney cancer Neg Hx    Bladder Cancer Neg Hx    Prostate cancer Neg Hx     SOCIAL HISTORY:  Social History   Socioeconomic History   Marital status: Married    Spouse name: Not on file   Number of children: Not on file   Years of education: Not on file   Highest education level: Not on file  Occupational History   Not on file  Social Needs   Financial resource strain: Not on file   Food insecurity:    Worry: Not on file    Inability: Not on file   Transportation needs:    Medical: Not on file    Non-medical: Not on file  Tobacco Use   Smoking status: Former Smoker    Last attempt to quit: 2003    Years since quitting: 17.4   Smokeless tobacco: Never Used  Substance and Sexual Activity   Alcohol use: Yes    Comment: social   Drug use: No   Sexual activity: Not on file  Lifestyle   Physical activity:    Days per week: Not on file    Minutes per session: Not on  file   Stress: Not on file  Relationships   Social connections:    Talks on phone: Not on file    Gets together: Not on file    Attends religious service: Not on file    Active member of club or organization: Not on file    Attends meetings of clubs or organizations: Not on file    Relationship status: Not on file   Intimate partner violence:    Fear of current or ex partner: Not on file    Emotionally abused: Not on file    Physically abused: Not on file    Forced sexual activity: Not on file  Other Topics Concern   Not on file  Social History Narrative   Not on file     PHYSICAL EXAM  There were no vitals filed for this visit.  There is no height or weight on file to calculate BMI.   General: The patient is well-developed and well-nourished and in no acute distress  Vision:   20/200 OS and 20/30 OD   Neurologic Exam  Mental status:    The patient is alert and oriented x 3 at the time of the examination. The patient has apparent normal recent and remote memory, with an apparently normal attention span and concentration ability.   Speech is normal.  Cranial nerves: Extraocular movements are full. The pupils show a 2+ left APD.    Colors are desaturated out of the left eye and he reduced ability to read across the room.    Facial strength is normal.  Trapezius and sternocleidomastoid strength is normal. No dysarthria is noted.  The tongue is midline, and the patient has symmetric elevation of the soft palate. No obvious hearing deficits are noted.  Motor:  Muscle bulk is normal.   Tone is normal. Strength is  5 / 5 on the right.   He has very reduced rapid rotating movements of the left arm.  Strength was 4/5 in the  left arm, 4+/5 in the proximal left leg and 4/5 in the distal left leg/ankle/foot.  Sensory: Sensory testing is intact on the right.  He has reduced touch, temperature and vibration sensation in the left arm and leg..   Coordination: Cerebellar testing shows  slightly reduced left nose finger and very reduced left heel-to-shin.  Gait and station: Station is normal.   He has a left foot drop.  Tandem gait was wide.  The Romberg was positive.   Reflexes: Deep tendon reflexes are symmetric and increased at the knees with spread. No ankle clonus.       DIAGNOSTIC DATA (LABS, IMAGING, TESTING) - I reviewed patient records, labs, notes, testing and imaging myself where available.  Lab Results  Component Value Date   WBC 7.8 05/04/2018   HGB 14.9 05/04/2018   HCT 43.3 05/04/2018   MCV 83 05/04/2018   PLT 275 05/04/2018      Component Value Date/Time   NA 142 05/04/2018 1514   K 3.4 (L) 05/04/2018 1514   CL 103 05/04/2018 1514   CO2 23 05/04/2018 1514   GLUCOSE 96 05/04/2018 1514   GLUCOSE 163 (H) 01/20/2017 0311   BUN 10 05/04/2018 1514   CREATININE 0.92 05/04/2018 1514   CALCIUM 9.5 05/04/2018 1514   PROT 6.4 05/04/2018 1514   ALBUMIN 4.8 05/04/2018 1514   AST 10 05/04/2018 1514   ALT 14 05/04/2018 1514   ALKPHOS 67 05/04/2018 1514   BILITOT 0.4 05/04/2018 1514   GFRNONAA 104 05/04/2018 1514   GFRAA 120 05/04/2018 1514

## 2018-08-27 ENCOUNTER — Encounter: Payer: Self-pay | Admitting: Neurology

## 2018-09-24 ENCOUNTER — Encounter: Payer: Self-pay | Admitting: Emergency Medicine

## 2018-09-24 ENCOUNTER — Emergency Department
Admission: EM | Admit: 2018-09-24 | Discharge: 2018-09-24 | Disposition: A | Discharge status: Other Acute Inpatient Hospital | Payer: BC Managed Care – PPO | Source: Home / Self Care | Attending: Emergency Medicine | Admitting: Emergency Medicine

## 2018-09-24 ENCOUNTER — Other Ambulatory Visit: Payer: Self-pay

## 2018-09-24 ENCOUNTER — Inpatient Hospital Stay
Admission: AD | Admit: 2018-09-24 | Discharge: 2018-09-28 | DRG: 885 | Disposition: A | Discharge status: Home / Self Care | Payer: BC Managed Care – PPO | Attending: Psychiatry | Admitting: Psychiatry

## 2018-09-24 DIAGNOSIS — Z807 Family history of other malignant neoplasms of lymphoid, hematopoietic and related tissues: Secondary | ICD-10-CM

## 2018-09-24 DIAGNOSIS — Z79899 Other long term (current) drug therapy: Secondary | ICD-10-CM | POA: Insufficient documentation

## 2018-09-24 DIAGNOSIS — Z800 Family history of malignant neoplasm of digestive organs: Secondary | ICD-10-CM | POA: Diagnosis not present

## 2018-09-24 DIAGNOSIS — Z981 Arthrodesis status: Secondary | ICD-10-CM

## 2018-09-24 DIAGNOSIS — I4891 Unspecified atrial fibrillation: Secondary | ICD-10-CM | POA: Insufficient documentation

## 2018-09-24 DIAGNOSIS — Z7289 Other problems related to lifestyle: Secondary | ICD-10-CM

## 2018-09-24 DIAGNOSIS — F909 Attention-deficit hyperactivity disorder, unspecified type: Secondary | ICD-10-CM | POA: Diagnosis present

## 2018-09-24 DIAGNOSIS — G35 Multiple sclerosis: Secondary | ICD-10-CM | POA: Insufficient documentation

## 2018-09-24 DIAGNOSIS — I10 Essential (primary) hypertension: Secondary | ICD-10-CM | POA: Diagnosis present

## 2018-09-24 DIAGNOSIS — Z832 Family history of diseases of the blood and blood-forming organs and certain disorders involving the immune mechanism: Secondary | ICD-10-CM

## 2018-09-24 DIAGNOSIS — G4700 Insomnia, unspecified: Secondary | ICD-10-CM | POA: Diagnosis present

## 2018-09-24 DIAGNOSIS — R45851 Suicidal ideations: Secondary | ICD-10-CM | POA: Diagnosis present

## 2018-09-24 DIAGNOSIS — Z1159 Encounter for screening for other viral diseases: Secondary | ICD-10-CM | POA: Diagnosis not present

## 2018-09-24 DIAGNOSIS — F332 Major depressive disorder, recurrent severe without psychotic features: Secondary | ICD-10-CM | POA: Insufficient documentation

## 2018-09-24 DIAGNOSIS — F322 Major depressive disorder, single episode, severe without psychotic features: Secondary | ICD-10-CM

## 2018-09-24 DIAGNOSIS — Z87891 Personal history of nicotine dependence: Secondary | ICD-10-CM | POA: Insufficient documentation

## 2018-09-24 DIAGNOSIS — IMO0002 Reserved for concepts with insufficient information to code with codable children: Secondary | ICD-10-CM

## 2018-09-24 DIAGNOSIS — Z03818 Encounter for observation for suspected exposure to other biological agents ruled out: Secondary | ICD-10-CM | POA: Insufficient documentation

## 2018-09-24 LAB — CBC
HCT: 47.7 % (ref 39.0–52.0)
Hemoglobin: 16 g/dL (ref 13.0–17.0)
MCH: 28.4 pg (ref 26.0–34.0)
MCHC: 33.5 g/dL (ref 30.0–36.0)
MCV: 84.6 fL (ref 80.0–100.0)
Platelets: 278 10*3/uL (ref 150–400)
RBC: 5.64 MIL/uL (ref 4.22–5.81)
RDW: 13 % (ref 11.5–15.5)
WBC: 7.2 10*3/uL (ref 4.0–10.5)
nRBC: 0 % (ref 0.0–0.2)

## 2018-09-24 LAB — COMPREHENSIVE METABOLIC PANEL
ALT: 15 U/L (ref 0–44)
AST: 13 U/L — ABNORMAL LOW (ref 15–41)
Albumin: 4.8 g/dL (ref 3.5–5.0)
Alkaline Phosphatase: 54 U/L (ref 38–126)
Anion gap: 10 (ref 5–15)
BUN: 11 mg/dL (ref 6–20)
CO2: 26 mmol/L (ref 22–32)
Calcium: 9.7 mg/dL (ref 8.9–10.3)
Chloride: 104 mmol/L (ref 98–111)
Creatinine, Ser: 1.26 mg/dL — ABNORMAL HIGH (ref 0.61–1.24)
GFR calc Af Amer: >60 mL/min (ref >60)
GFR calc non Af Amer: >60 mL/min (ref >60)
Glucose, Bld: 125 mg/dL — ABNORMAL HIGH (ref 70–99)
Potassium: 3.8 mmol/L (ref 3.5–5.1)
Sodium: 140 mmol/L (ref 135–145)
Total Bilirubin: 0.7 mg/dL (ref 0.3–1.2)
Total Protein: 7.9 g/dL (ref 6.5–8.1)

## 2018-09-24 LAB — SALICYLATE LEVEL: Salicylate Lvl: <7 mg/dL (ref 2.8–30.0)

## 2018-09-24 LAB — ACETAMINOPHEN LEVEL: Acetaminophen (Tylenol), Serum: <10 ug/mL — ABNORMAL LOW (ref 10–30)

## 2018-09-24 LAB — ETHANOL: Alcohol, Ethyl (B): <10 mg/dL (ref <10)

## 2018-09-24 LAB — SARS CORONAVIRUS 2 BY RT PCR (HOSPITAL ORDER, PERFORMED IN ~~LOC~~ HOSPITAL LAB): SARS Coronavirus 2: NEGATIVE

## 2018-09-24 MED ORDER — MAGNESIUM HYDROXIDE 400 MG/5ML PO SUSP: 30.0000 mL | Freq: Every day | ORAL | Status: DC | PRN

## 2018-09-24 MED ORDER — ACETAMINOPHEN 325 MG PO TABS: 650.0000 mg | ORAL_TABLET | Freq: Four times a day (QID) | ORAL | Status: DC | PRN

## 2018-09-24 MED ORDER — ALUM & MAG HYDROXIDE-SIMETH 200-200-20 MG/5ML PO SUSP: 30.0000 mL | ORAL | Status: DC | PRN

## 2018-09-24 NOTE — BH Assessment (Signed)
Assessment Note  Sean Espinoza is an 43 y.o. male who presents to the ER via law enforcement after his wife called. She was afraid he was going to attempt suicide. Per the patient, he hasn't slept in several weeks and "I let things build up." Patient further explains, he was diagnosed with MS approximately two years ago. For the last several months, he's noticed changes in his ability to do things. However, patient is still independent and able to perform his ADL's without assistance.  As a result, he's having a difficult time adjusting to the changes and the possibility of what he may and may not be able to do. Yesterday (09/23/2018), he was irritable due to a lack of sleep and when was trying to go to sleep, his teenage children were too loud. Patient start yelling at the children and left the house. He acknowledges he overacted, due to the stress. When patient left the house, he had his gun with him, with the intentions of ending his life. "When I got home my wife had already called the police. I know I need help but I didn't know how to ask." Patient has been in communication with his "MS doctor" about starting mental health therapy. When patient spoke with the therapist, they informed him they were waiting on the referral from his doctor.    Per the report of the patient's wife ([phone]), when the patient was diagnosed approximately two years ago with MS, she noticed the changes in his mood. However, the last several months his mood and behaviors have worsened. He's easily agitated, he's sleep decreased and several times he has had a "break down." Patient told her, he was scared and would cry. "Which is not like him." Approximately a month ago, the patient was upset and left the house to calm down. The next day he told the wife, he had gun and thought about ending his life. He eventually told her, he had pulled the trigger two times but the bullet got jammed. When the patient left the house  last night, she was scared because she couldn't find the gun in the house. Thus, she called Patent examiner.  During the interview, the patient was calm, cooperative and pleasant. He was able to provide appropriate answers to the questions. He admits to having plans of ending his life and having the means to carry it out. As the patient was talking, several times he became tearful. He denies the use of any mind-altering substances. He also denies involvement with the legal system and no history of violence or aggression. Throughout the interview, he denied HI and AV/H.  Diagnosis: Major Depression  Past Medical History:  Past Medical History:  Diagnosis Date  . Atrial fibrillation (HCC)   . Hypertension   . MS (multiple sclerosis) (HCC)   . Vision abnormalities     Past Surgical History:  Procedure Laterality Date  . CERVICAL FUSION  2005, 2014    Family History:  Family History  Problem Relation Age of Onset  . Lupus Mother   . Healthy Father   . Lymphoma Maternal Grandfather   . Colon cancer Paternal Grandmother   . Kidney cancer Neg Hx   . Bladder Cancer Neg Hx   . Prostate cancer Neg Hx     Social History:  reports that he quit smoking about 17 years ago. He has never used smokeless tobacco. He reports current alcohol use. He reports that he does not use drugs.  Additional Social History:  Alcohol / Drug Use Pain Medications: See PTA Prescriptions: See PTA Over the Counter: See PTA History of alcohol / drug use?: No history of alcohol / drug abuse Longest period of sobriety (when/how long): n/a  CIWA: CIWA-Ar BP: 126/85 Pulse Rate: 76 COWS:    Allergies: No Known Allergies  Home Medications: (Not in a hospital admission)   OB/GYN Status:  No LMP for male patient.  General Assessment Data Location of Assessment: [facility] ED TTS Assessment: In system Is this a Tele or Face-to-Face Assessment?: Face-to-Face Is this an Initial Assessment or a Re-assessment for  this encounter?: Initial Assessment Patient Accompanied by:: N/A Language Other than English: No Living Arrangements: Other (Comment)(Private Home) What gender do you identify as?: Male Marital status: Married Pregnancy Status: No Living Arrangements: Spouse/significant other, Children Can pt return to current living arrangement?: Yes Admission Status: Involuntary Petitioner: ED Attending Is patient capable of signing voluntary admission?: No(Under IVC) Referral Source: Self/Family/Friend Insurance type: Insurance risk surveyor Exam ([place]) Medical Exam completed: Yes  Crisis Care Plan Living Arrangements: Spouse/significant other, Children Legal Guardian: Other:(Self) Name of Psychiatrist: Reports of none Name of Therapist: Reports of none  Education Status Is patient currently in school?: No Is the patient employed, unemployed or receiving disability?: Unemployed  Risk to self with the past 6 months Suicidal Ideation: Yes-Currently Present Has patient been a risk to self within the past 6 months prior to admission? : Yes Suicidal Intent: Yes-Currently Present Has patient had any suicidal intent within the past 6 months prior to admission? : Yes Is patient at risk for suicide?: Yes Suicidal Plan?: Yes-Currently Present Has patient had any suicidal plan within the past 6 months prior to admission? : Yes Specify Current Suicidal Plan: Shoot self with gun Access to Means: Yes Specify Access to Suicidal Means: Have a gun What has been your use of drugs/alcohol within the last 12 months?: Reports of none Previous Attempts/Gestures: Yes How many times?: 1 Other Self Harm Risks: Reports of none Triggers for Past Attempts: Other (Comment)(Health problems) Intentional Self Injurious Behavior: None Family Suicide History: No Recent stressful life event(s): Recent negative physical changes Persecutory voices/beliefs?: No Depression: Yes Depression Symptoms: Insomnia,  Tearfulness, Isolating, Fatigue, Guilt, Loss of interest in usual pleasures, Feeling worthless/self pity Substance abuse history and/or treatment for substance abuse?: No Suicide prevention information given to non-admitted patients: Not applicable  Risk to Others within the past 6 months Homicidal Ideation: No Does patient have any lifetime risk of violence toward others beyond the six months prior to admission? : No Thoughts of Harm to Others: No Current Homicidal Intent: No Current Homicidal Plan: No Access to Homicidal Means: No Identified Victim: Reports of none History of harm to others?: No Assessment of Violence: None Noted Violent Behavior Description: Reports of none Does patient have access to weapons?: No Criminal Charges Pending?: No Does patient have a court date: No Is patient on probation?: No  Psychosis Hallucinations: None noted Delusions: None noted  Mental Status Report Appearance/Hygiene: Unremarkable, In scrubs Eye Contact: Good Motor Activity: Freedom of movement, Unremarkable Speech: Logical/coherent, Unremarkable Level of Consciousness: Alert Mood: Anxious, Depressed, Sad, Pleasant Affect: Appropriate to circumstance, Depressed, Sad Anxiety Level: None Thought Processes: Coherent, Relevant Judgement: Unimpaired Orientation: Person, Place, Time, Situation, Appropriate for developmental age Obsessive Compulsive Thoughts/Behaviors: None  Cognitive Functioning Concentration: Normal Memory: Recent Intact, Remote Intact Is patient IDD: No Insight: Good Impulse Control: Good Appetite: Fair Have you had any weight changes? : No Change Sleep:  Decreased Total Hours of Sleep: 2 Vegetative Symptoms: None  ADLScreening [facility] Assessment Services) Patient's cognitive ability adequate to safely complete daily activities?: Yes Patient able to express need for assistance with ADLs?: Yes Independently performs ADLs?: Yes (appropriate for developmental  age)  Prior Inpatient Therapy Prior Inpatient Therapy: No  Prior Outpatient Therapy Prior Outpatient Therapy: No Does patient have an ACCT team?: No Does patient have Intensive In-House Services?  : No Does patient have [facility] services? : No Does patient have P4CC services?: No  ADL Screening (condition at time of admission) Patient's cognitive ability adequate to safely complete daily activities?: Yes Is the patient deaf or have difficulty hearing?: No Does the patient have difficulty seeing, even when wearing glasses/contacts?: No Does the patient have difficulty concentrating, remembering, or making decisions?: No Patient able to express need for assistance with ADLs?: Yes Does the patient have difficulty dressing or bathing?: No Independently performs ADLs?: Yes (appropriate for developmental age) Does the patient have difficulty walking or climbing stairs?: No Weakness of Legs: None Weakness of Arms/Hands: None  Home Assistive Devices/Equipment Home Assistive Devices/Equipment: None  Therapy Consults (therapy consults require a physician order) PT Evaluation Needed: No OT Evalulation Needed: No SLP Evaluation Needed: No Abuse/Neglect Assessment (Assessment to be complete while patient is alone) Abuse/Neglect Assessment Can Be Completed: Yes Physical Abuse: Denies Verbal Abuse: Yes, past (Comment) Sexual Abuse: Denies Exploitation of patient/patient's resources: Denies Self-Neglect: Denies Values / Beliefs Cultural Requests During Hospitalization: None Spiritual Requests During Hospitalization: None Consults Spiritual Care Consult Needed: No Social Work Consult Needed: No Merchant navy officerAdvance Directives (For Healthcare) Does Patient Have a Medical Advance Directive?: No Would patient like information on creating a medical advance directive?: No - Patient declined       Child/Adolescent Assessment Running Away Risk: Denies(Patient is an adult)  Disposition:   Disposition Initial Assessment Completed for this Encounter: Yes  On Site Evaluation by:   Reviewed with Physician:    Lilyan Gilfordalvin J. Takari Duncombe MS, LCAS, [facility], NCC, CCSI Therapeutic Triage Specialist 09/24/2018 11:24 AM

## 2018-09-24 NOTE — [facility] Group Notes (Signed)
[place] Group Notes:  (Nursing/MHT/Case Management/Adjunct)  Date:  09/24/2018  Time:  9:11 PM  Type of Therapy:  Group Therapy  Participation Level:  Did Not Attend   Nehemiah Settle 09/24/2018, 9:11 PM

## 2018-09-24 NOTE — Plan of Care (Signed)
Pt rates depression 8/10 and and denies anxiety, SI, HI and AVH. Pt has been educated on care plan and verbalizes understanding. Pt has been oriented to the unit. Collier Bullock RN Problem: Education: Goal: Utilization of techniques to improve thought processes will improve Outcome: Not Progressing Goal: Knowledge of the prescribed therapeutic regimen will improve Outcome: Not Progressing   Problem: Coping: Goal: Coping ability will improve Outcome: Not Progressing Goal: Will verbalize feelings Outcome: Not Progressing   Problem: Self-Concept: Goal: Will verbalize positive feelings about self Outcome: Not Progressing Goal: Level of anxiety will decrease Outcome: Not Progressing   Problem: Education: Goal: Ability to make informed decisions regarding treatment will improve Outcome: Not Progressing   Problem: Coping: Goal: Coping ability will improve Outcome: Not Progressing   Problem: Health Behavior/Discharge Planning: Goal: Identification of resources available to assist in meeting health care needs will improve Outcome: Not Progressing   Problem: Medication: Goal: Compliance with prescribed medication regimen will improve Outcome: Not Progressing

## 2018-09-24 NOTE — ED Provider Notes (Signed)
[facility] Emergency Department Provider Note  ____________________________________________   First MD Initiated Contact with Patient 09/24/18 [phone]     (approximate)  I have reviewed the triage vital signs and the nursing notes.   HISTORY  Chief Complaint Mental Health Problem    HPI JUN OSMENT is a 43 y.o. male with a relatively recent diagnosis of MS with some chronic left-sided weakness after a flare in February 2020.  He presents voluntarily for psychiatric evaluation.  He has been increasingly depressed over an extended period of time since his neurological diagnosis.  He has not been able to work and his depression has become severe.  Tonight he says he reached "the breaking point" and he snuck out of the house while his wife was asleep and took a gun with him.  He states that he put it in the trunk of his car "so I would have to work to get out" and he does not think he would have actually shot himself but he is thinking about committing suicide.  He feels helpless and depressed.  He has no medical complaints or concerns; his multiple sclerosis is stable and he has had no other recent symptoms.  He denies headache, sore throat, cough, shortness of breath, nausea, vomiting, chest pain, abdominal pain, and dysuria.  The psychiatric symptoms are severe and nothing in particular is making it better or worse.  He has had no contact with anyone COVID-19     He also struck himself on the forehead repeatedly with her Pringles can out of anger and frustration.    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 Authorizing Provider  amphetamine-dextroamphetamine (ADDERALL) 15 MG tablet Take one po three times a day 08/26/18   Britt Bottom, MD  dalfampridine 10 MG TB12 One po q 12 hours 08/26/18   Sater, Nanine Means, MD  metoprolol tartrate (LOPRESSOR) 50 MG tablet Take 1 tablet (50 mg total) by mouth 2 (two) times daily. 11/12/17   Sater, Nanine Means, MD  ocrelizumab (OCREVUS) 300 MG/10ML injection Inject into the vein once.    [provider]  omeprazole (PRILOSEC) 40 MG capsule Take 1 capsule (40 mg total) by mouth daily. 08/26/18   Sater, Nanine Means, MD    Allergies Patient has no known allergies.  Family History  Problem Relation Age of Onset   Lupus Mother    Healthy Father    Lymphoma Maternal Grandfather    Colon cancer Paternal Grandmother    Kidney cancer Neg Hx    Bladder Cancer Neg Hx    Prostate cancer Neg Hx     Social History Social History   Tobacco Use   Smoking status: Former Smoker    Quit date: 2003  Years since quitting: 17.4   Smokeless tobacco: Never Used  Substance Use Topics   Alcohol use: Yes    Comment: social   Drug use: No    Review of Systems Constitutional: No fever/chills Eyes: No visual changes. ENT: No sore throat. Cardiovascular: Denies chest pain. Respiratory: Denies shortness of breath. Gastrointestinal: No abdominal pain.  No nausea, no vomiting.  No diarrhea.  No constipation. Genitourinary: Negative for dysuria. Musculoskeletal: Negative for neck pain.  Negative for back pain. Integumentary: Negative for rash. Neurological: Chronic left-sided weakness after MS flare in February.   Negative for headaches, acute focal weakness or numbness. Psych:  severe depression  ____________________________________________   PHYSICAL EXAM:  VITAL SIGNS: ED Triage Vitals  Enc Vitals Group     BP 09/24/18 0426 (!) 172/121     Pulse Rate 09/24/18 0426 90     Resp 09/24/18 0426 20     Temp 09/24/18 0426 99.2 F (37.3 C)     Temp Source 09/24/18 0426 Oral     SpO2 09/24/18 0426 100 %     Weight 09/24/18 0424 117.9 kg (260 lb)     Height 09/24/18 0424 1.854 m (6\' 1" )     Head Circumference --      Peak Flow --      Pain Score 09/24/18 0426 0     Pain Loc --      Pain Edu? --      Excl. in GC? --     Constitutional: Alert and oriented. Well appearing and in no acute distress. Eyes: Conjunctivae are normal.  Head: Multiple contusions on the forehead that are circular and consistent with the report that he hit himself in the forehead with a Pringles can. Nose: No congestion/rhinnorhea. Mouth/Throat: Mucous membranes are moist. Neck: No stridor.  No meningeal signs.   Cardiovascular: Normal rate, regular rhythm. Good peripheral circulation. Grossly normal heart sounds. Respiratory: Normal respiratory effort.  No retractions. No audible wheezing. Gastrointestinal: Soft and nontender. No distention.  Musculoskeletal: No lower extremity tenderness nor edema. No gross deformities of extremities. Neurologic:  Normal speech and language.  Patient has chronic decreased strength in the left arm and left leg but no acute abnormalities.  He says he is at his baseline.   Skin:  Skin is warm, dry and intact. No rash noted. Psychiatric: Mood and affect are normal. Speech and behavior are normal.  He is calm and cooperative.  He endorses depression and suicidal ideation and admits to hitting himself in the forehead.  ____________________________________________   LABS (all labs ordered are listed, but only abnormal results are displayed)  Labs Reviewed  COMPREHENSIVE METABOLIC PANEL  - Abnormal; Notable for the following components:      Result Value   Glucose, Bld 125 (*)    Creatinine, Ser 1.26 (*)    AST 13 (*)    All other components within normal limits  ACETAMINOPHEN LEVEL - Abnormal; Notable for the following components:   Acetaminophen (Tylenol), Serum <10 (*)    All other components within normal limits  ETHANOL  SALICYLATE LEVEL  CBC  URINE DRUG SCREEN, QUALITATIVE ([facility] ONLY)   ____________________________________________  EKG  None - EKG not ordered by ED physician ____________________________________________  RADIOLOGY   ED MD interpretation: No indication for imaging  Official radiology report(s): No results found.  ____________________________________________   PROCEDURES   Procedure(s) performed (including Critical Care):  Procedures   ____________________________________________   INITIAL IMPRESSION / MDM / ASSESSMENT AND PLAN / ED  COURSE  As part of my medical decision making, I reviewed the following data within the electronic MEDICAL RECORD NUMBER Nursing notes reviewed and incorporated, Labs reviewed , Old chart reviewed, A consult was requested and obtained from this/these consultant(s) Psychiatry and Notes from prior ED visits      *Jarome MatinBrian P Buckalew was evaluated in Emergency Department on 09/24/2018 for the symptoms described in the history of present illness. He was evaluated in the context of the global COVID-19 pandemic, which necessitated consideration that the patient might be at risk for infection with the SARS-CoV-2 virus that causes COVID-19. Institutional protocols and algorithms that pertain to the evaluation of patients at risk for COVID-19 are in a state of rapid change based on information released by regulatory bodies including the CDC and federal and state organizations. These policies and algorithms were followed during the patient's care in the ED.  Some ED evaluations and interventions may be delayed as a  result of limited staffing during the pandemic.*  Differential diagnosis includes, but is not limited to, major depressive disorder, suicidal ideation, adjustment disorder, mood disorder.  He has no acute medical complaints or concerns and his labs are reassuring and within normal limits.  He is medically cleared for psychiatric evaluation.  I am concerned he is at significant risk for committing suicide given his chronic medical issues and worsening depression, his access to firearms, and the fact that he snuck out of the house without telling his wife with a gun in his car.  He will need inpatient psychiatric consultation.  He will also need a coronavirus swab if he requires admission or placement, and an attempt to not hold up placement which would not only resulted in ED overcrowding but would result in delayed psychiatric care, I have ordered the rapid swab.      ____________________________________________  FINAL CLINICAL IMPRESSION(S) / ED DIAGNOSES  Final diagnoses:  Current severe episode of major depressive disorder without psychotic features without prior episode (HCC)  Self-inflicted injury  Suicidal ideation     MEDICATIONS GIVEN DURING THIS VISIT:  Medications - No data to display   ED Discharge Orders    None       Note:  This document was prepared using Dragon voice recognition software and may include unintentional dictation errors.   Loleta RoseForbach, Franceska Strahm, MD 09/24/18 [phone]

## 2018-09-24 NOTE — Consult Note (Signed)
[facility] Face-to[facility]Face [facility] Consult   Reason for Consult: Depressed with suicidal thoughts Referring Physician:  Dr.Forbach Patient Identification: Sean Espinoza MRN:  [id number] Principal Diagnosis: Major depressive disorder Diagnosis:  Active Problems:   * No active hospital problems. *   Total Time spent with patient: 30 minutes  Subjective:   Sean Espinoza is a 43 y.o. male patient admitted with depressed mood and suicidal ideations.  HPI: Patient is a 43 year old male who was recently diagnosed with multiple sclerosis and has been progressively depressed for the last 2 years per patient.  States that he has not been sleeping well or eating well more recently.  He has been feeling quite hopeless.  Patient has been feeling very emotional and crying easily.  States that last night he felt like he reached a breaking point and went on a house with a gun.  He put the gun in the trunk of his car.  But he did report to the ER attending that he wanted to commit suicide.  He denies any current multiple sclerosis complaints. He works as a Production designer, theatre/television/filmmanager at [employer] and is married with children.  Patient broke down into tears and was unable to continue the conversation.  He denies use of any drugs or alcohol.  He denies any psychotic symptoms.  No other medical issues. He is agreeable to an inpatient admission though a bit hesitant initially.  Past Psychiatric History: No previous psychiatric hospitalizations or suicide attempts.  Risk to Self:  yes Risk to Others:  none Prior Inpatient Therapy:  none Prior Outpatient Therapy:  none  Past Medical History:  Past Medical History:  Diagnosis Date  . Atrial fibrillation (HCC)   . Hypertension   . MS (multiple sclerosis) (HCC)   . Vision abnormalities     Past Surgical History:  Procedure Laterality Date  . CERVICAL FUSION  2005, 2014   Family History:  Family History  Problem Relation Age of Onset  . Lupus Mother   . Healthy  Father   . Lymphoma Maternal Grandfather   . Colon cancer Paternal Grandmother   . Kidney cancer Neg Hx   . Bladder Cancer Neg Hx   . Prostate cancer Neg Hx    Family Psychiatric  History: unknown Social History:  Social History   Substance and Sexual Activity  Alcohol Use Yes   Comment: social     Social History   Substance and Sexual Activity  Drug Use No    Social History   Socioeconomic History  . Marital status: Married    Spouse name: Not on file  . Number of children: Not on file  . Years of education: Not on file  . Highest education level: Not on file  Occupational History  . Not on file  Social Needs  . Financial resource strain: Not on file  . Food insecurity    Worry: Not on file    Inability: Not on file  . Transportation needs    Medical: Not on file    Non-medical: Not on file  Tobacco Use  . Smoking status: Former Smoker    Quit date: 2003    Years since quitting: 17.4  . Smokeless tobacco: Never Used  Substance and Sexual Activity  . Alcohol use: Yes    Comment: social  . Drug use: No  . Sexual activity: Not on file  Lifestyle  . Physical activity    Days per week: Not on file    Minutes per  session: Not on file  . Stress: Not on file  Relationships  . Social Musician on phone: Not on file    Gets together: Not on file    Attends religious service: Not on file    Active member of club or organization: Not on file    Attends meetings of clubs or organizations: Not on file    Relationship status: Not on file  Other Topics Concern  . Not on file  Social History Narrative  . Not on file   Additional Social History: Patient is married with children and works as a Production designer, theatre/television/film at [employer].    Allergies:  No Known Allergies  Labs:  Results for orders placed or performed during the hospital encounter of 09/24/18 (from the past 48 hour(s))  Comprehensive metabolic panel     Status: Abnormal   Collection Time: 09/24/18   4:28 AM  Result Value Ref Range   Sodium 140 135 - 145 mmol/L   Potassium 3.8 3.5 - 5.1 mmol/L   Chloride 104 98 - 111 mmol/L   CO2 26 22 - 32 mmol/L   Glucose, Bld 125 (H) 70 - 99 mg/dL   BUN 11 6 - 20 mg/dL   Creatinine, Ser 3.47 (H) 0.61 - 1.24 mg/dL   Calcium 9.7 8.9 - 42.5 mg/dL   Total Protein 7.9 6.5 - 8.1 g/dL   Albumin 4.8 3.5 - 5.0 g/dL   AST 13 (L) 15 - 41 U/L   ALT 15 0 - 44 U/L   Alkaline Phosphatase 54 38 - 126 U/L   Total Bilirubin 0.7 0.3 - 1.2 mg/dL   GFR calc non Af Amer >60 >60 mL/min   GFR calc Af Amer >60 >60 mL/min   Anion gap 10 5 - 15    Comment: Performed at [facility], [address] [place], [place] [address]  Ethanol     Status: None   Collection Time: 09/24/18  4:28 AM  Result Value Ref Range   Alcohol, Ethyl (B) <10 <10 mg/dL    Comment: (NOTE) Lowest detectable limit for serum alcohol is 10 mg/dL. For medical purposes only. Performed at [facility], [address] [place], [place] [address]   Salicylate level     Status: None   Collection Time: 09/24/18  4:28 AM  Result Value Ref Range   Salicylate Lvl <7.0 2.8 - 30.0 mg/dL    Comment: Performed at [facility], [address] [place], [place] [address]  Acetaminophen level     Status: Abnormal   Collection Time: 09/24/18  4:28 AM  Result Value Ref Range   Acetaminophen (Tylenol), Serum <10 (L) 10 - 30 ug/mL    Comment: (NOTE) Therapeutic concentrations vary significantly. A range of 10-30 ug/mL  may be an effective concentration for many patients. However, some  are best treated at concentrations outside of this range. Acetaminophen concentrations >150 ug/mL at 4 hours after ingestion  and >50 ug/mL at 12 hours after ingestion are often associated with  toxic reactions. Performed at [facility], [address] [place], [place] [address]   cbc     Status: None   Collection Time: 09/24/18  4:28 AM  Result Value Ref Range   WBC 7.2 4.0 -  10.5 K/uL   RBC 5.64 4.22 - 5.81 MIL/uL   Hemoglobin 16.0 13.0 - 17.0 g/dL   HCT 60.6 30.1 - 60.1 %   MCV 84.6 80.0 - 100.0  fL   MCH 28.4 26.0 - 34.0 pg   MCHC 33.5 30.0 - 36.0 g/dL   RDW 13.0 11.5 - 15.5 %   Platelets 278 150 - 400 K/uL   nRBC 0.0 0.0 - 0.2 %    Comment: Performed at [facility], [address] [place], [place] [address]    No current facility-administered medications for this encounter.    Current Outpatient Medications  Medication Sig Dispense Refill  . amphetamine-dextroamphetamine (ADDERALL) 15 MG tablet Take one po three times a day 90 tablet 0  . dalfampridine 10 MG TB12 One po q 12 hours 60 tablet 5  . metoprolol tartrate (LOPRESSOR) 50 MG tablet Take 1 tablet (50 mg total) by mouth 2 (two) times daily. 60 tablet 5  . ocrelizumab (OCREVUS) 300 MG/10ML injection Inject into the vein once.    [name] omeprazole (PRILOSEC) 40 MG capsule Take 1 capsule (40 mg total) by mouth daily. 90 capsule 3   Facility-Administered Medications Ordered in Other Encounters  Medication Dose Route Frequency Provider Last Rate Last Dose  . gadopentetate dimeglumine (MAGNEVIST) injection 20 mL  20 mL Intravenous Once PRN Sater, Nanine Means, MD        Musculoskeletal: Strength & Muscle Tone: within normal limits Gait & Station: normal Patient leans: N/A  Psychiatric Specialty Exam: Physical Exam  ROS  Blood pressure (!) 172/121, pulse 90, temperature 99.2 F (37.3 C), temperature source Oral, resp. rate 20, height 6\' 1"  (1.854 m), weight 117.9 kg, SpO2 100 %.Body mass index is 34.3 kg/m.  General Appearance: Casual  Eye Contact:  Fair  Speech:  Slow  Volume:  Decreased  Mood:  Anxious, Depressed and Hopeless  Affect:  Constricted, Depressed and Tearful  Thought Process:  Coherent  Orientation:  Full (Time, Place, and Person)  Thought Content:  Logical  Suicidal Thoughts:  Yes.  with intent/plan  Homicidal Thoughts:  No  Memory:  Immediate;   Fair Recent;    Fair Remote;   Fair  Judgement:  Impaired  Insight:  Shallow  Psychomotor Activity:  Decreased  Concentration:  Concentration: Fair and Attention Span: Fair  Recall:  [employer] of Knowledge:  Fair  Language:  Fair  Akathisia:  No  Handed:  Right  AIMS (if indicated):     Assets:  Communication Skills Desire for Improvement Financial Resources/Insurance Housing Social Support Vocational/Educational  ADL's:  Intact  Cognition:  WNL  Sleep:   poor     Treatment Plan Summary: Daily contact with patient to assess and evaluate symptoms and progress in treatment and Medication management  Patient is a 43 year old male with new onset depression probably secondary to his multiple sclerosis diagnosis.  Presenting with CVA suicidal ideations and will benefit from inpatient admission for further stabilization. Discussed plan with ER attending Dr. Kerman Passey. Patient will be admitted to the behavioral health unit at [facility].  Disposition: Recommend psychiatric Inpatient admission when medically cleared. Supportive therapy provided about ongoing stressors.  Elvin So, MD 09/24/2018 10:31 AM

## 2018-09-24 NOTE — ED Notes (Signed)
Patient given breakfast tray.

## 2018-09-24 NOTE — BH Assessment (Signed)
Patient is to be admitted to [facility] by Dr. Einar Grad.  Attending Physician will be Dr. Weber Cooks.   Patient has been assigned to room 306, by [facility] Charge Nurse [place] staff is aware of the admission:  Glenda, ER Secretary    Dr. Deniece Portela, ER MD   Donneta Romberg, Patient's Nurse   Vonna Kotyk, Patient Access.
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
## 2018-09-24 NOTE — Tx Team (Signed)
Initial Treatment Plan 09/24/2018 5:30 PM Sean Espinoza [id number]    PATIENT STRESSORS: Health problems   PATIENT STRENGTHS: Ability for insight Child psychotherapist Motivation for treatment/growth Supportive family/friends   PATIENT IDENTIFIED PROBLEMS: depression  Suicidal ideations on admission                   DISCHARGE CRITERIA:  Ability to meet basic life and health needs Improved stabilization in mood, thinking, and/or behavior Medical problems require only outpatient monitoring Verbal commitment to aftercare and medication compliance  PRELIMINARY DISCHARGE PLAN: Outpatient therapy Return to previous living arrangement  PATIENT/FAMILY INVOLVEMENT: This treatment plan has been presented to and reviewed with the patient, Sean Espinoza.  The patient has been given the opportunity to ask questions and make suggestions.  Kieth Brightly, RN 09/24/2018, 5:30 PM

## 2018-09-24 NOTE — ED Notes (Signed)
Hourly rounding reveals patient sleeping in room. No complaints, stable, in no acute distress. Q15 minute rounds and monitoring via Security to continue. 

## 2018-09-25 DIAGNOSIS — F332 Major depressive disorder, recurrent severe without psychotic features: Principal | ICD-10-CM

## 2018-09-25 DIAGNOSIS — R45851 Suicidal ideations: Secondary | ICD-10-CM

## 2018-09-25 MED ORDER — AMPHETAMINE-DEXTROAMPHETAMINE 5 MG PO TABS
10.0000 mg | ORAL_TABLET | Freq: Two times a day (BID) | ORAL | Status: DC
  Administered 2018-09-26 – 2018-09-28 (×5): 10 mg via ORAL
  Filled 2018-09-25 (×6): qty 2

## 2018-09-25 MED ORDER — CITALOPRAM HYDROBROMIDE 20 MG PO TABS
20.0000 mg | ORAL_TABLET | Freq: Every day | ORAL | Status: DC
  Administered 2018-09-25 – 2018-09-28 (×4): 20 mg via ORAL
  Filled 2018-09-25 (×4): qty 1

## 2018-09-25 NOTE — BHH Group Notes (Signed)
LCSW Group Therapy Note  09/25/2018 2:06 PM  Type of Therapy/Topic:  Group Therapy:  Balance in Life  Participation Level:  Active  Description of Group:    This group will address the concept of balance and how it feels and looks when one is unbalanced. Patients will be encouraged to process areas in their lives that are out of balance and identify reasons for remaining unbalanced. Facilitators will guide patients in utilizing problem-solving interventions to address and correct the stressor making their life unbalanced. Understanding and applying boundaries will be explored and addressed for obtaining and maintaining a balanced life. Patients will be encouraged to explore ways to assertively make their unbalanced needs known to significant others in their lives, using other group members and facilitator for support and feedback.  Therapeutic Goals: 1. Patient will identify two or more emotions or situations they have that consume much of in their lives. 2. Patient will identify signs/triggers that life has become out of balance:  3. Patient will identify two ways to set boundaries in order to achieve balance in their lives:  4. Patient will demonstrate ability to communicate their needs through discussion and/or role plays  Summary of Patient Progress: Pt was appropriate and respectful in group. Pt was able to identify balance meaning "being in-sync with all parts of life". Pt reported that time management is a good way to maintain balance in life. Pt reported that healthy boundaries is also a way to maintain balance as well as knowing when to take a break and saying no to extra responsibilities.      Therapeutic Modalities:   Cognitive Behavioral Therapy Solution-Focused Therapy Assertiveness Training  Evalina Field, MSW, LCSW Clinical Social Work 09/25/2018 2:06 PM

## 2018-09-25 NOTE — Plan of Care (Signed)
Pt rates depression 2/10 and anxiety 0/10. Pt denies SI, HI and AVH. Pt has a goal "to go to groups". Pt was educated on care plan and verbalizes understanding. Collier Bullock RN Problem: Education: Goal: Utilization of techniques to improve thought processes will improve Outcome: Progressing Goal: Knowledge of the prescribed therapeutic regimen will improve Outcome: Progressing   Problem: Coping: Goal: Coping ability will improve Outcome: Progressing Goal: Will verbalize feelings Outcome: Progressing   Problem: Self-Concept: Goal: Will verbalize positive feelings about self Outcome: Progressing Goal: Level of anxiety will decrease Outcome: Progressing   Problem: Education: Goal: Ability to make informed decisions regarding treatment will improve Outcome: Progressing   Problem: Coping: Goal: Coping ability will improve Outcome: Progressing   Problem: Health Behavior/Discharge Planning: Goal: Identification of resources available to assist in meeting health care needs will improve Outcome: Progressing   Problem: Medication: Goal: Compliance with prescribed medication regimen will improve Outcome: Progressing

## 2018-09-25 NOTE — Plan of Care (Signed)
  Problem: Education: Goal: Ability to make informed decisions regarding treatment will improve Outcome: Progressing  Patient is able to make informed decsisions about his treatment plan.  Problem: Self-Concept: Goal: Will verbalize positive feelings about self Outcome: Progressing  Patient verbalized feelings about his new health diagnosis and how overwhelming it is for him.
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
## 2018-09-25 NOTE — Progress Notes (Addendum)
Patient alert and oriented x 4, affect is flat and sad upon approach, he isolates to self minimal interaction with peers and staff. Patient's thoughts are organized and coherent, he rated depression a 6/10 ( 0 low- 10 high ) and denies SI/HI/AVH. Patient refused PRN  medication for sleep. Patient was noted in the milieu during snack time but he didn't attend evening wrap up group. Support and encouragement offered to patient and he was receptive, 15 minutes safety checks maintained will continue to monitor.

## 2018-09-25 NOTE — H&P (Signed)
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  Patient brought to the emergency room under involuntary commitment because of depression with suicidal threats.  Patient states that he got in an argument with his wife and when it escalated he admits that he made a statement about killing himself.  He left the house and took a gun with him.  Patient emphasizes that the gun was still in its case and that he never took it out.  He says he never really intended to shoot himself but thinks he was trying to frighten his wife.  In any case he does admit that he has been sleeping very poorly for several weeks.  He feels more irritable and edgy much of the time.  He has been having trouble tolerating noise and chaos around the house for instance from his children.  Patient takes Adderall for his multiple sclerosis and according to the controlled substance database the dose has recently been escalated.  Patient denies drinking or using any other drugs.  Denies homicidal ideation.  Denies any psychotic symptoms. Associated Signs/Symptoms: Depression Symptoms:  depressed mood, anhedonia, insomnia, suicidal thoughts without plan, (Hypo) Manic Symptoms:  Irritable Mood, Anxiety Symptoms:  none Psychotic Symptoms:  none PTSD Symptoms: Negative Total Time spent with patient: 1 hour  Past Psychiatric History: No previous psychiatric hospitalization.  No history of suicide attempts.  Evidently no previous antidepressant medicine.  Is the patient at risk to self? Yes.     Has the patient been a risk to self in the past 6 months? No.  Has the patient been a risk to self within the distant past? No.  Is the patient a risk to others? No.  Has the patient been a risk to others in the past 6 months? No.  Has the patient been a risk to others within the distant past? No.   Prior Inpatient Therapy:   Prior Outpatient Therapy:    Alcohol Screening: 1. How often do you have a drink containing alcohol?: Never 2. How many drinks containing alcohol do you have on a typical day when you are drinking?: 1 or 2 3. How often do you have six or more drinks on one occasion?: Never AUDIT-C Score: 0 4. How often during the last year have you found that you were not able to stop drinking once you had started?: Never 5. How often during the last year have you failed to do what was normally expected from you becasue of drinking?: Never 6. How often during the last year have you needed a first drink in the morning to get yourself going after a heavy drinking session?: Never 7. How often during the last year have you had a feeling of guilt of remorse after drinking?: Never 8. How often during the last year have you been unable to remember what happened the night before because you had been drinking?: Never 9. Have you or someone else been injured as a result of your drinking?: No 10. Has a relative or friend or a doctor or another health worker been concerned about your drinking or suggested you cut down?: No Alcohol Use Disorder  Identification Test Final Score (AUDIT): 0 Substance Abuse History in the last 12 months:  No. Consequences of Substance Abuse: Negative Previous Psychotropic Medications: Yes  Psychological Evaluations: Yes  Past Medical History:  Past Medical History:  Diagnosis Date  . Atrial fibrillation ([place])   . Hypertension   . MS (multiple sclerosis) ([place])   . Vision abnormalities     Past Surgical History:  Procedure Laterality Date  . CERVICAL FUSION   2005, 2014   Family History:  Family History  Problem Relation Age of Onset  . Lupus Mother   . Healthy Father   . Lymphoma Maternal Grandfather   . Colon cancer Paternal Grandmother   . Kidney cancer Neg Hx   . Bladder Cancer Neg Hx   . Prostate cancer Neg Hx    Family Psychiatric  History: Denies being aware of any family history Tobacco Screening:   Social History:  Social History   Substance and Sexual Activity  Alcohol Use Yes   Comment: social     Social History   Substance and Sexual Activity  Drug Use No    Additional Social History: Marital status: Married Number of Years Married: 10 What types of issues is patient dealing with in the relationship?: No issue reported Additional relationship information: N/A Are you sexually active?: Yes What is your sexual orientation?: Heterosexual Has your sexual activity been affected by drugs, alcohol, medication, or emotional stress?: None reported Does patient have children?: Yes How many children?: 3 How is patient's relationship with their children?: Pt reports he has two step children and one bio child, says he gets along with his children                         Allergies:  No Known Allergies Lab Results:  Results for orders placed or performed during the hospital encounter of 09/24/18 (from the past 48 hour(s))  Comprehensive metabolic panel     Status: Abnormal   Collection Time: 09/24/18  4:28 AM  Result Value Ref Range   Sodium 140 135 - 145 mmol/L   Potassium 3.8 3.5 - 5.1 mmol/L   Chloride 104 98 - 111 mmol/L   CO2 26 22 - 32 mmol/L   Glucose, Bld 125 (H) 70 - 99 mg/dL   BUN 11 6 - 20 mg/dL   Creatinine, Ser 1.26 (H) 0.61 - 1.24 mg/dL   Calcium 9.7 8.9 - 10.3 mg/dL   Total Protein 7.9 6.5 - 8.1 g/dL   Albumin 4.8 3.5 - 5.0 g/dL   AST 13 (L) 15 - 41 U/L   ALT 15 0 - 44 U/L   Alkaline Phosphatase 54 38 - 126 U/L   Total Bilirubin 0.7 0.3 - 1.2 mg/dL   GFR calc non Af Amer >60 >60 mL/min    GFR calc Af Amer >60 >60 mL/min   Anion gap 10 5 - 15    Comment: Performed at [facility], [address] [place], [place] [address]  Ethanol     Status: None   Collection Time: 09/24/18  4:28 AM  Result Value Ref Range   Alcohol, Ethyl (B) <10 <10 mg/dL    Comment: (NOTE) Lowest detectable limit for serum alcohol is 10 mg/dL. For medical purposes only. Performed at [facility], [address], [place], [place] [address]   Salicylate level     Status: None   Collection Time: 09/24/18  4:28 AM  Result Value Ref Range  Salicylate Lvl <5.1 2.8 - 30.0 mg/dL    Comment: Performed at [facility], [address] [place], [place] [address]  Acetaminophen level     Status: Abnormal   Collection Time: 09/24/18  4:28 AM  Result Value Ref Range   Acetaminophen (Tylenol), Serum <10 (L) 10 - 30 ug/mL    Comment: (NOTE) Therapeutic concentrations vary significantly. A range of 10-30 ug/mL  may be an effective concentration for many patients. However, some  are best treated at concentrations outside of this range. Acetaminophen concentrations >150 ug/mL at 4 hours after ingestion  and >50 ug/mL at 12 hours after ingestion are often associated with  toxic reactions. Performed at [facility], [address] [place], [place] [address]   cbc     Status: None   Collection Time: 09/24/18  4:28 AM  Result Value Ref Range   WBC 7.2 4.0 - 10.5 K/uL   RBC 5.64 4.22 - 5.81 MIL/uL   Hemoglobin 16.0 13.0 - 17.0 g/dL   HCT 47.7 39.0 - 52.0 %   MCV 84.6 80.0 - 100.0 fL   MCH 28.4 26.0 - 34.0 pg   MCHC 33.5 30.0 - 36.0 g/dL   RDW 13.0 11.5 - 15.5 %   Platelets 278 150 - 400 K/uL   nRBC 0.0 0.0 - 0.2 %    Comment: Performed at [facility], [address] [place], [place] [address]  SARS Coronavirus 2 (CEPHEID - Performed in [place] hospital lab), Hosp Order     Status: None   Collection Time: 09/24/18  7:42 AM   Specimen: Nasopharyngeal  Swab  Result Value Ref Range   SARS Coronavirus 2 NEGATIVE NEGATIVE    Comment: (NOTE) If result is NEGATIVE SARS-CoV-2 target nucleic acids are NOT DETECTED. The SARS-CoV-2 RNA is generally detectable in upper and lower  respiratory specimens during the acute phase of infection. The lowest  concentration of SARS-CoV-2 viral copies this assay can detect is 250  copies / mL. A negative result does not preclude SARS-CoV-2 infection  and should not be used as the sole basis for treatment or other  patient management decisions.  A negative result may occur with  improper specimen collection / handling, submission of specimen other  than nasopharyngeal swab, presence of viral mutation(s) within the  areas targeted by this assay, and inadequate number of viral copies  (<250 copies / mL). A negative result must be combined with clinical  observations, patient history, and epidemiological information. If result is POSITIVE SARS-CoV-2 target nucleic acids are DETECTED. The SARS-CoV-2 RNA is generally detectable in upper and lower  respiratory specimens dur ing the acute phase of infection.  Positive  results are indicative of active infection with SARS-CoV-2.  Clinical  correlation with patient history and other diagnostic information is  necessary to determine patient infection status.  Positive results do  not rule out bacterial infection or co-infection with other viruses. If result is PRESUMPTIVE POSTIVE SARS-CoV-2 nucleic acids MAY BE PRESENT.   A presumptive positive result was obtained on the submitted specimen  and confirmed on repeat testing.  While 2019 novel coronavirus  (SARS-CoV-2) nucleic acids may be present in the submitted sample  additional confirmatory testing may be necessary for epidemiological  and / or clinical management purposes  to differentiate between  SARS-CoV-2 and other Sarbecovirus currently known to infect humans.  If clinically indicated additional testing  with an alternate test  methodology [phone]) is advised. The SARS-CoV-2 RNA is  generally  detectable in upper and lower respiratory sp ecimens during the acute  phase of infection. The expected result is Negative. Fact Sheet for Patients:  StrictlyIdeas.no Fact Sheet for Healthcare Providers: BankingDealers.co.za This test is not yet approved or cleared by the [place] FDA and has been authorized for detection and/or diagnosis of SARS-CoV-2 by FDA under an Emergency Use Authorization (EUA).  This EUA will remain in effect (meaning this test can be used) for the duration of the COVID-19 declaration under Section 564(b)(1) of the Act, 21 U.S.C. section 360bbb-3(b)(1), unless the authorization is terminated or revoked sooner. Performed at [facility], [address] [place], [place] [address]     Blood Alcohol level:  Lab Results  Component Value Date   [facility] <10 09/24/2018   ETH <5 28/41/3244    Metabolic Disorder Labs:  Lab Results  Component Value Date   HGBA1C 5.1 07/02/2016   MPG 100 07/02/2016   Lab Results  Component Value Date   PROLACTIN 7.6 01/30/2017   Lab Results  Component Value Date   CHOL 197 07/02/2016   TRIG 206 (H) 07/02/2016   HDL 29 (L) 07/02/2016   CHOLHDL 6.8 07/02/2016   VLDL 41 (H) 07/02/2016   LDLCALC 127 (H) 07/02/2016    Current Medications: Current Facility-Administered Medications  Medication Dose Route Frequency Provider Last Rate Last Dose  . acetaminophen (TYLENOL) tablet 650 mg  650 mg Oral Q6H PRN Ravi, Himabindu, MD      . alum & mag hydroxide-simeth (MAALOX/MYLANTA) 200-200-20 MG/5ML suspension 30 mL  30 mL Oral Q4H PRN Einar Grad, Himabindu, MD      . Derrill Memo ON 09/26/2018] amphetamine-dextroamphetamine (ADDERALL) tablet 10 mg  10 mg Oral BID WC Clapacs, John T, MD      . citalopram (CELEXA) tablet 20 mg  20 mg Oral Daily Clapacs, John T, MD      . magnesium hydroxide (MILK  OF MAGNESIA) suspension 30 mL  30 mL Oral Daily PRN Ravi, Himabindu, MD       Facility-Administered Medications Ordered in Other Encounters  Medication Dose Route Frequency Provider Last Rate Last Dose  . gadopentetate dimeglumine (MAGNEVIST) injection 20 mL  20 mL Intravenous Once PRN Sater, Nanine Means, MD       PTA Medications: Medications Prior to Admission  Medication Sig Dispense Refill Last Dose  . amphetamine-dextroamphetamine (ADDERALL) 15 MG tablet Take one po three times a day 90 tablet 0 Past Week at Unknown time  . metoprolol tartrate (LOPRESSOR) 50 MG tablet Take 1 tablet (50 mg total) by mouth 2 (two) times daily. 60 tablet 5 Past Week at Unknown time  . ocrelizumab (OCREVUS) 300 MG/10ML injection Inject into the vein once.   unknown at unknown  . omeprazole (PRILOSEC) 40 MG capsule Take 1 capsule (40 mg total) by mouth daily. 90 capsule 3 Past Week at Unknown time    Musculoskeletal: Strength & Muscle Tone: within normal limits Gait & Station: normal Patient leans: N/A  Psychiatric Specialty Exam: Physical Exam  Nursing note and vitals reviewed. Constitutional: He appears well-developed and well-nourished.  HENT:  Head: Normocephalic and atraumatic.  Eyes: Pupils are equal, round, and reactive to light. Conjunctivae are normal.  Neck: Normal range of motion.  Cardiovascular: Regular rhythm and normal heart sounds.  Respiratory: Effort normal. No respiratory distress.  GI: Soft.  Musculoskeletal: Normal range of motion.  Neurological: He is alert.  Skin: Skin is warm and dry.  Psychiatric: Judgment normal. His affect is blunt.  His speech is delayed. He is slowed. Cognition and memory are normal. He expresses no suicidal ideation.    Review of Systems  Constitutional: Negative.   HENT: Negative.   Eyes: Negative.   Respiratory: Negative.   Cardiovascular: Negative.   Gastrointestinal: Negative.   Musculoskeletal: Negative.   Skin: Negative.   Neurological:  Negative.   Psychiatric/Behavioral: Positive for depression and suicidal ideas. Negative for hallucinations and substance abuse. The patient is nervous/anxious and has insomnia.     Blood pressure (!) 130/94, pulse (!) 106, temperature 97.9 F (36.6 C), temperature source Oral, resp. rate 18, height 6' 1" (1.854 m), weight 120.2 kg, SpO2 99 %.Body mass index is 34.96 kg/m.  General Appearance: Casual  Eye Contact:  Good  Speech:  Slow  Volume:  Decreased  Mood:  Euthymic  Affect:  Constricted  Thought Process:  Coherent  Orientation:  Full (Time, Place, and Person)  Thought Content:  Logical  Suicidal Thoughts:  No  Homicidal Thoughts:  No  Memory:  Immediate;   Fair Recent;   Fair Remote;   Fair  Judgement:  Fair  Insight:  Fair  Psychomotor Activity:  Normal  Concentration:  Concentration: Fair  Recall:  [employer] of Knowledge:  Fair  Language:  Fair  Akathisia:  No  Handed:  Right  AIMS (if indicated):     Assets:  Desire for Improvement Housing Resilience  ADL's:  Intact  Cognition:  WNL  Sleep:  Number of Hours: 8.15    Treatment Plan Summary: Daily contact with patient to assess and evaluate symptoms and progress in treatment, Medication management and Plan Patient seen and chart reviewed.  Patient met with treatment team.  Patient with fairly recent worsening of his mood with irritability poor stimulus tolerance and insomnia.  Seems to have been coincident with increasing the dose of his Adderall.  Patient is currently denying any suicidal ideation.  Differential diagnosis includes depression and possibly irritability from elevated dosage of Adderall or both.  I pointed out the concordance with the Adderall dose and the patient agreed that that made sense.  He had just in the last couple weeks been increased up to 15 mg 3 times a day.  I suggested we back it down to 20 mg total a day.  Meanwhile we will start low-dose citalopram for depression.  Patient is currently  calm and cooperative.  Reevaluate daily possible length of stay 2 to 3 days.  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)   Suicidal ideation  Long Term Goal(s): Improvement in symptoms so as ready for discharge  Short Term Goals: Ability to maintain clinical measurements within normal limits will improve and Compliance with prescribed medications will improve  I certify that inpatient services furnished can reasonably be expected to improve the patient's condition.    Alethia Berthold, MD 6/26/20204:38 PM

## 2018-09-25 NOTE — [facility] Suicide Risk Assessment (Signed)
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  Patient with a history of multiple sclerosis came into the hospital under IVC with reports that he had expressed suicidal intent.  Patient in interview today admits that he has been more irritable dysphoric unhappy and easily bothered.  He emphasizes that he has been sleeping very badly recently.  He admits that after getting in an argument with his wife he ran out of the house taking a firearm with him which is why she filed commitment papers.  Patient currently denies suicidal ideation or plan.  Denies psychotic symptoms.  Continued Clinical Symptoms:  Alcohol Use Disorder Identification Test Final Score (AUDIT): 0 The "Alcohol Use Disorders Identification Test", Guidelines for Use in Primary Care, Second Edition.  World Pharmacologist [facility]). Score between 0-7:  no or low risk or alcohol related problems. Score between 8-15:  moderate risk of alcohol related problems. Score between 16-19:  high risk of alcohol related problems. Score 20 or above:  warrants further diagnostic evaluation for alcohol dependence and treatment.   CLINICAL FACTORS:   Depression:   Insomnia   Musculoskeletal: Strength & Muscle Tone: within normal limits Gait & Station: normal Patient leans: N/A  Psychiatric  Specialty Exam: Physical Exam  Nursing note and vitals reviewed. Constitutional: He appears well-developed and well-nourished.  HENT:  Head: Normocephalic and atraumatic.  Eyes: Pupils are equal, round, and reactive to light. Conjunctivae are normal.  Neck: Normal range of motion.  Cardiovascular: Normal heart sounds.  Respiratory: Effort normal.  GI: Soft.  Musculoskeletal: Normal range of motion.  Neurological: He is alert.  Skin: Skin is warm and dry.  Psychiatric: His affect is blunt. His speech is delayed. He is slowed. Cognition and memory are normal. He expresses impulsivity. He expresses suicidal ideation. He expresses no suicidal plans.    Review of Systems  Constitutional: Negative.   HENT: Negative.   Eyes: Negative.   Respiratory: Negative.   Cardiovascular: Negative.   Gastrointestinal: Negative.   Musculoskeletal: Negative.   Skin: Negative.   Neurological: Negative.   Psychiatric/Behavioral: Positive for depression and suicidal ideas. Negative for hallucinations and substance abuse. The patient is nervous/anxious and has insomnia.     Blood pressure (!) 130/94, pulse (!) 106, temperature 97.9 F (36.6 C), temperature source Oral, resp. rate 18, height '6\' 1"'$  (1.854 m), weight 120.2 kg, SpO2 99 %.Body mass index is 34.96 kg/m.  General Appearance: Casual  Eye Contact:  Fair  Speech:  Slow  Volume:  Decreased  Mood:  Dysphoric  Affect:  Constricted  Thought Process:  Coherent  Orientation:  Full (Time, Place, and Person)  Thought Content:  Logical  Suicidal Thoughts:  No  Homicidal Thoughts:  No  Memory:  Immediate;   Fair Recent;   Fair Remote;   Fair  Judgement:  Fair  Insight:  Fair  Psychomotor Activity:  Decreased  Concentration:  Concentration: Fair  Recall:  [employer]  of Knowledge:  Fair  Language:  Fair  Akathisia:  No  Handed:  Right  AIMS (if indicated):     Assets:  Communication Skills Desire for Improvement Housing Physical  Health Resilience Social Support  ADL's:  Intact  Cognition:  WNL  Sleep:  Number of Hours: 8.15      COGNITIVE FEATURES THAT CONTRIBUTE TO RISK:  Loss of executive function    SUICIDE RISK:   Mild:  Suicidal ideation of limited frequency, intensity, duration, and specificity.  There are no identifiable plans, no associated intent, mild dysphoria and related symptoms, good self-control (both objective and subjective assessment), few other risk factors, and identifiable protective factors, including available and accessible social support.  PLAN OF CARE: Patient continue on 15-minute checks.  Here on IVC.  Reviewed symptoms and history and made suggestions for medication management.  Patient met with full treatment team.  Engage in individual and group counseling and reassess suicidality regularly prior to discharge at which point we will make an appropriate outpatient referral  I certify that inpatient services furnished can reasonably be expected to improve the patient's condition.   Alethia Berthold, MD 09/25/2018, 4:33 PM

## 2018-09-25 NOTE — Progress Notes (Signed)
Recreation Therapy Notes  INPATIENT RECREATION THERAPY ASSESSMENT  Patient Details Name: SLAYDE BRAULT MRN: [id number] DOB: [birth year] Today's Date: 09/25/2018       Information Obtained From: Patient  Able to Participate in Assessment/Interview: Yes  Patient Presentation: Responsive  Reason for Admission (Per Patient): Active Symptoms  Patient Stressors:    Coping Skills:   Building control surveyor, Avoidance  Leisure Interests (2+):  Nature - Hiking, Therapist, music - Microbiologist of Recreation/Participation: Monthly  Awareness of Community Resources:     [employer]:     Current Use:    If no, Barriers?:    Expressed Interest in [employer] Information:    [employer] of Residence:  Insurance underwriter  Patient Main Form of Transportation: Musician  Patient Strengths:  Understanding  Patient Identified Areas of Improvement:  Be more patient  Patient Goal for Hospitalization:  Learn how to understand myself  Current SI (including self-harm):  No  Current HI:  No  Current AVH: No  Staff Intervention Plan: Group Attendance, Collaborate with Interdisciplinary Treatment Team  Consent to Intern Participation: N/A  Schuyler Behan 09/25/2018, 11:24 AM

## 2018-09-25 NOTE — Progress Notes (Signed)
Recreation Therapy Notes  Date: 09/25/2018  Time: 9:30 am  Location: Craft room  Behavioral response: Appropriate  Intervention Topic: Self-care  Discussion/Intervention:  Group content today was focused on Self-Care. The group defined self-care and some positive ways they care for themselves. Individuals expressed ways and reasons why they neglected any self-care in the past. Patients described ways to improve self-care in the future. The group explained what could happen if they did not do any self-care activities at all. The group participated in the intervention "self-care assessment" where they had a chance to discover some of their weaknesses and strengths in self- care. Patient came up with a self-care plan to improve themselves in the future.  Clinical Observations/Feedback:  Patient came to group and expressed that he participates in self-care by mentally taking a break. He explained that it is important to know your limits so can know when to say no. Individual wa social with peers and staff while participating in group.  Lois Slagel LRT/CTRS          Cami Delawder 09/25/2018 11:08 AM

## 2018-09-25 NOTE — [facility] Suicide Risk Assessment (Signed)
Sean Espinoza INPATIENT:  Family/Significant Other Suicide Prevention Education  Suicide Prevention Education:  Education Completed; Sean Espinoza, wife [phone] has been identified by the patient as the family member/significant other with whom the patient will be residing, and identified as the person(s) who will aid the patient in the event of a mental health crisis (suicidal ideations/suicide attempt).  With written consent from the patient, the family member/significant other has been provided the following suicide prevention education, prior to the and/or following the discharge of the patient.  The suicide prevention education provided includes the following:  Suicide risk factors  Suicide prevention and interventions  National Suicide Hotline telephone number  [facility] assessment telephone number  [facility] Emergency Assistance [place] and/or Residential Mobile Crisis Unit telephone number  Request made of family/significant other to:  Remove weapons (e.g., guns, rifles, knives), all items previously/currently identified as safety concern.    Remove drugs/medications (over-the-counter, prescriptions, illicit drugs), all items previously/currently identified as a safety concern.  The family member/significant other verbalizes understanding of the suicide prevention education information provided.  The family member/significant other agrees to remove the items of safety concern listed above. Ms. Sean Espinoza reports pt was dx with multiple sclerosis two years ago and has been experiencing worsening depression and SI. She states the pt is "quick to anger and cry." She reports he owns a firearm but it was given to the police, she denies him having access to any other guns or weapons. She does not report any reservation about the pt returning to the home at discharge.  Sean Espinoza Sean Espinoza 09/25/2018, 11:12 AM

## 2018-09-25 NOTE — Tx Team (Addendum)
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 acetaminophen (TYLENOL) tablet 650 mg  650 mg Oral Q6H PRN Ravi, Himabindu, MD      . alum & mag hydroxide-simeth (MAALOX/MYLANTA) 200-200-20 MG/5ML suspension 30 mL  30 mL Oral Q4H PRN Daleen Boavi, Himabindu, MD      . Melene Muller[START ON 09/26/2018] amphetamine-dextroamphetamine (ADDERALL) tablet 10 mg  10 mg Oral BID WC Clapacs, John T, MD      . citalopram (CELEXA) tablet 20 mg  20 mg Oral Daily Clapacs, John T, MD      . magnesium hydroxide (MILK OF MAGNESIA) suspension 30 mL  30 mL Oral Daily PRN Ravi, Himabindu, MD       Facility-Administered Medications Ordered in Other Encounters  Medication Dose Route Frequency Provider Last Rate Last Dose  . gadopentetate dimeglumine (MAGNEVIST) injection 20 mL  20 mL Intravenous Once PRN Sater, Pearletha Furlichard A, MD       PTA Medications: Medications Prior to Admission  Medication Sig Dispense Refill Last Dose  . amphetamine-dextroamphetamine (ADDERALL) 15 MG tablet Take one po three times a day 90 tablet 0 Past Week at Unknown time  . metoprolol tartrate (LOPRESSOR) 50 MG tablet Take 1 tablet (50 mg total) by mouth 2 (two) times daily. 60 tablet 5 Past Week at Unknown time  . ocrelizumab (OCREVUS) 300 MG/10ML injection Inject into the vein once.   unknown at unknown  . omeprazole (PRILOSEC) 40 MG capsule Take 1 capsule (40 mg total) by mouth daily. 90 capsule 3 Past Week at Unknown time    Patient Stressors: Health problems  Patient Strengths: Ability for insight Licensed conveyancerCommunication skills Financial means Motivation for  treatment/growth Supportive family/friends  Treatment Modalities: Medication Management, Group therapy, Case management,  1 to 1 session with clinician, Psychoeducation, Recreational therapy.   Physician Treatment Plan for Primary Diagnosis: <principal problem not specified> Long Term Goal(s):     Short Term Goals:    Medication Management: Evaluate patient's response, side effects, and tolerance of medication regimen.  Therapeutic Interventions: 1 to 1 sessions, Unit Group sessions and Medication administration.  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  Therapeutic Interventions: 1 to 1 sessions, Unit Group sessions and Medication administration.  Evaluation of Outcomes: Progressing   RN Treatment Plan for Primary Diagnosis: <principal problem not specified> Long Term Goal(s): Knowledge of disease and therapeutic regimen to maintain health will improve  Short Term Goals: Ability to participate in decision making will improve, Ability to verbalize feelings will improve, Ability to disclose and discuss suicidal ideas, Ability to identify and develop effective coping behaviors will improve and Compliance with prescribed medications will improve  Medication Management: RN will administer medications as ordered by provider, will assess and evaluate patient's response and provide education to patient for prescribed medication. RN will report any adverse and/or side effects to prescribing provider.  Therapeutic Interventions: 1 on 1 counseling sessions, Psychoeducation, Medication administration, Evaluate responses to treatment, Monitor  vital signs and CBGs as ordered, Perform/monitor CIWA, COWS,  AIMS and Fall Risk screenings as ordered, Perform wound care treatments as ordered.  Evaluation of Outcomes: Progressing   LCSW Treatment Plan for Primary Diagnosis: <principal problem not specified> Long Term Goal(s): Safe transition to appropriate next level of care at discharge, Engage patient in therapeutic group addressing interpersonal concerns.  Short Term Goals: Engage patient in aftercare planning with referrals and resources  Therapeutic Interventions: Assess for all discharge needs, 1 to 1 time with Social worker, Explore available resources and support systems, Assess for adequacy in community support network, Educate family and significant other(s) on suicide prevention, Complete Psychosocial Assessment, Interpersonal group therapy.  Evaluation of Outcomes: Progressing   Progress in Treatment: Attending groups: Yes. Participating in groups: Yes. Taking medication as prescribed: Yes. Toleration medication: Yes. Family/Significant other contact made: Yes, individual(s) contacted:  Rosita Fire, wife Patient understands diagnosis: Yes. Discussing patient identified problems/goals with staff: Yes. Medical problems stabilized or resolved: Yes. Denies suicidal/homicidal ideation: Yes. Issues/concerns per patient self-inventory: No. Other: NA  New problem(s) identified: No, Describe:  none reported  New Short Term/Long Term Goal(s): medication management for mood stabilization; elimination of SI thoughts; development of comprehensive mental wellness/sobriety plan.  Patient Goals:  "Learn how to de-escalate"  Discharge Plan or Barriers: Pt is scheduled to be discharged on 09/28/18, referral has been [facility] to [facility] for outpatient treatment.  Reason for Continuation of Hospitalization: Medication stabilization  Estimated Length of Stay:3-5 days   Recreational Therapy: Patient Stressors: N/A  Patient Goal: Patient will demonstrate improved communication skills by  spontaneously contributing to 2 group discussions within 5 recreation therapy group sessions  Attendees: Patient:Sean Espinoza 09/25/2018 3:13 PM  Physician: Alethia Berthold 09/25/2018 3:13 PM  Nursing: Gertha Calkin 09/25/2018 3:13 PM  RN Care Manager: 09/25/2018 3:13 PM  Social Worker: Darren Lehman Prom Assunta Curtis [employer] 09/25/2018 3:13 PM  Recreational Therapist: Roanna Epley 09/25/2018 3:13 PM  Other:  09/25/2018 3:13 PM  Other:  09/25/2018 3:13 PM  Other: 09/25/2018 3:13 PM    Scribe for Treatment Team: Yvette Rack, LCSW 09/25/2018 3:13 PM

## 2018-09-25 NOTE — Progress Notes (Signed)
Pt has met his goals of going to the groups today. He went to all of them. Collier Bullock RN

## 2018-09-26 MED ORDER — TRAZODONE HCL 50 MG PO TABS: 50.0000 mg | ORAL_TABLET | Freq: Every evening | ORAL | Status: DC | PRN

## 2018-09-26 MED ORDER — HYDROXYZINE HCL 25 MG PO TABS: 25.0000 mg | ORAL_TABLET | Freq: Four times a day (QID) | ORAL | Status: DC | PRN

## 2018-09-26 NOTE — Plan of Care (Signed)
Pt denies depression, anxiety, SI, HI and AVH. Pt was educated on care plan and verbalizes understanding. Collier Bullock RN Problem: Education: Goal: Utilization of techniques to improve thought processes will improve Outcome: Progressing Goal: Knowledge of the prescribed therapeutic regimen will improve Outcome: Progressing   Problem: Coping: Goal: Coping ability will improve Outcome: Progressing Goal: Will verbalize feelings Outcome: Progressing   Problem: Self-Concept: Goal: Will verbalize positive feelings about self Outcome: Progressing Goal: Level of anxiety will decrease Outcome: Progressing   Problem: Education: Goal: Ability to make informed decisions regarding treatment will improve Outcome: Progressing   Problem: Coping: Goal: Coping ability will improve Outcome: Progressing   Problem: Health Behavior/Discharge Planning: Goal: Identification of resources available to assist in meeting health care needs will improve Outcome: Progressing   Problem: Medication: Goal: Compliance with prescribed medication regimen will improve Outcome: Progressing

## 2018-09-26 NOTE — Progress Notes (Signed)
Patient alert and oriented x 4, affect is flat but he brightens upon approach., he was noted interacting with peers and staff appropriately. Patient's thoughts are organized and coherent, he rated depression a 5/10 ( 0 low- 10 high ) and denies SI/HI/AVH.  Patient was noted in the milieu during snack time but he didn't attend evening wrap up group. Support and encouragement offered to patient and he was receptive, 15 minutes safety checks maintained will continue to monitor.

## 2018-09-26 NOTE — Plan of Care (Signed)
  Problem: Coping: Goal: Will verbalize feelings Outcome: Progressing  Patient verbalized feelings about his treatment plan, and his new health diagnosis.

## 2018-09-26 NOTE — Progress Notes (Signed)
[facility]  09/26/2018 10:29 AM Sean Espinoza  MRN:  [id number] Subjective: Patient is a 43 year old male with a past psychiatric history significant for major depression who was admitted on 09/25/2018 with worsening depression and suicidal ideation.  Objective: Patient is seen and examined.  Patient is 43 year old male with the above-stated past psychiatric history who was admitted to the [facility] psychiatric department on 09/25/2018 with suicidal ideation.  The patient had gotten into an argument with his wife, and that it escalated.  He had made a statement about killing himself.  He was admitted to the hospital.  This a.m. he stated that his major issue is that he did not sleep well last night.  He stated that some patient in another room was very loud and yelling and kicking.  He stated if that had not occurred he would have been able to sleep.  Review of his admission note showed that he had had recent worsening of mood and poor control of irritability and insomnia.  His Adderall was decreased to 20 mg p.o. twice daily (given for the fatigue associated with multiple sclerosis) and he was started on citalopram for depression.  He was started on 20 mg p.o. daily.  His blood pressure this a.m. is mildly elevated at 138/95, his pulse is 102.  He is afebrile.  Despite his report nursing notes reflect that he slept 7.5 hours last night.  Review of his laboratories revealed a mildly elevated creatinine at 1.26 (4 months ago had been at 0.92) but otherwise negative laboratories.  He denied any suicidal ideation this a.m.  He denied any side effects to his current medications.  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 Atrial fibrillation (HCC)   . Hypertension   . MS (multiple sclerosis) (HCC)   . Vision abnormalities     Past Surgical History:  Procedure Laterality Date  . CERVICAL FUSION  2005, 2014   Family History:  Family History  Problem Relation Age of Onset  . Lupus Mother   . Healthy Father   . Lymphoma Maternal Grandfather   . Colon cancer Paternal Grandmother   . Kidney cancer Neg Hx   . Bladder Cancer Neg Hx   . Prostate cancer Neg Hx    Family Psychiatric  History: See admission H&P Social History:  Social History   Substance and Sexual Activity  Alcohol Use Yes   Comment: social     Social History   Substance and Sexual Activity  Drug Use No    Social History   Socioeconomic History  . Marital status: Married    Spouse name: Not on file  . Number of children: Not on file  . Years of education: Not on file  . Highest education level: Not on file  Occupational History  . Not on file  Social Needs  . Financial resource strain: Not on file  . Food insecurity    Worry: Not on file    Inability: Not on file  . Transportation needs    Medical: Not on file    Non-medical: Not on file  Tobacco Use  . Smoking status: Former  Smoker    Quit date: 2003    Years since quitting: 17.4  . Smokeless tobacco: Never Used  Substance and Sexual Activity  . Alcohol use: Yes    Comment: social  . Drug use: No  . Sexual activity: Not on file  Lifestyle  . Physical activity    Days per week: Not on file    Minutes per session: Not on file  . Stress: Not on file  Relationships  . Social Herbalist on phone: Not on file    Gets together: Not on file    Attends religious service: Not on file    Active member of club or organization: Not on file    Attends meetings of clubs or organizations: Not on file    Relationship status: Not on file   Other Topics Concern  . Not on file  Social History Narrative  . Not on file   Additional Social History:                         Sleep: Fair  Appetite:  Fair  Current Medications: Current Facility-Administered Medications  Medication Dose Route Frequency Provider Last Rate Last Dose  . acetaminophen (TYLENOL) tablet 650 mg  650 mg Oral Q6H PRN Ravi, Himabindu, MD      . alum & mag hydroxide-simeth (MAALOX/MYLANTA) 200-200-20 MG/5ML suspension 30 mL  30 mL Oral Q4H PRN Ravi, Himabindu, MD      . amphetamine-dextroamphetamine (ADDERALL) tablet 10 mg  10 mg Oral BID WC Clapacs, Madie Reno, MD   10 mg at 09/26/18 0846  . citalopram (CELEXA) tablet 20 mg  20 mg Oral Daily Clapacs, Madie Reno, MD   20 mg at 09/26/18 0846  . magnesium hydroxide (MILK OF MAGNESIA) suspension 30 mL  30 mL Oral Daily PRN Einar Grad, Himabindu, MD       Facility-Administered Medications Ordered in Other Encounters  Medication Dose Route Frequency Provider Last Rate Last Dose  . gadopentetate dimeglumine (MAGNEVIST) injection 20 mL  20 mL Intravenous Once PRN Sater, Nanine Means, MD        Lab Results: No results found for this or any previous visit (from the past 48 hour(s)).  Blood Alcohol level:  Lab Results  Component Value Date   ETH <10 09/24/2018   ETH <5 22/29/7989    Metabolic Disorder Labs: Lab Results  Component Value Date   HGBA1C 5.1 07/02/2016   MPG 100 07/02/2016   Lab Results  Component Value Date   PROLACTIN 7.6 01/30/2017   Lab Results  Component Value Date   CHOL 197 07/02/2016   TRIG 206 (H) 07/02/2016   HDL 29 (L) 07/02/2016   CHOLHDL 6.8 07/02/2016   VLDL 41 (H) 07/02/2016   LDLCALC 127 (H) 07/02/2016    Physical Findings: AIMS:  , ,  ,  ,    CIWA:    COWS:     Musculoskeletal: Strength & Muscle Tone: within normal limits Gait & Station: normal Patient leans: N/A  Psychiatric Specialty Exam: Physical Exam  Nursing note and vitals reviewed. Constitutional: He  is oriented to person, place, and time. He appears well-developed and well-nourished.  HENT:  Head: Normocephalic and atraumatic.  Respiratory: Effort normal.  Neurological: He is alert and oriented to person, place, and time.    ROS  Blood pressure (!) 138/95, pulse (!) 102, temperature 98.5 F (36.9 C), temperature source Oral, resp. rate 17, height 6\' 1"  (1.854  m), weight 120.2 kg, SpO2 99 %.Body mass index is 34.96 kg/m.  General Appearance: Casual  Eye Contact:  Fair  Speech:  Normal Rate  Volume:  Normal  Mood:  Anxious and Depressed  Affect:  Congruent  Thought Process:  Coherent and Descriptions of Associations: Circumstantial  Orientation:  Full (Time, Place, and Person)  Thought Content:  Logical  Suicidal Thoughts:  No  Homicidal Thoughts:  No  Memory:  Immediate;   Fair Recent;   Fair Remote;   Fair  Judgement:  Intact  Insight:  Fair  Psychomotor Activity:  Psychomotor Retardation  Concentration:  Concentration: Fair and Attention Span: Fair  Recall:  [employer] of Knowledge:  Good  Language:  Good  Akathisia:  Negative  Handed:  Right  AIMS (if indicated):     Assets:  Desire for Improvement Resilience  ADL's:  Intact  Cognition:  WNL  Sleep:  Number of Hours: 7.5     Treatment Plan Summary: Daily contact with patient to assess and evaluate symptoms and progress in treatment, Medication management and Plan : Patient is a 43 year old male with the above-stated past psychiatric history who is seen in follow-up.   Diagnosis: #1 major depression, recurrent, severe without psychotic features, #2 multiple sclerosis, #3 fatigue, #4 insomnia.  Patient is seen and examined.  Patient is a 43 year old male with the above-stated past psychiatric history is seen in follow-up.  He stated his only issue this morning was a lack of sleep secondary to some noise down the hall from other patient issues.  Nursing notes reflect that he slept 7.5 hours yesterday.  Dr. Toni Amend  had reduced his Adderall to improve his sleep.  I am not going to change that today, but will give consideration to perhaps dropping it tomorrow if he continues to have sleep issues.  He also denied any side effects to the citalopram, and is really only had been in his system for 24 hours.  His blood pressure is slightly elevated today, and I will monitor that.  It could also be affected by his anxiety as well as the Adderall.  His laboratories were essentially normal except for a mildly elevated creatinine at 1.26.  He did not have available trazodone last night's PRN, and I will add that.  Otherwise no other changes to his medications at this point. 1.  Continue citalopram 20 mg p.o. daily for depression and anxiety. 2.  Add hydroxyzine 25 mg p.o. every 6 hours as needed anxiety. 3.  Add trazodone 50 mg p.o. nightly as needed insomnia. 4.  Continue Adderall 10 mg p.o. twice daily for fatigue. 5.  Disposition planning-in progress.  Antonieta Pert, MD 09/26/2018, 10:29 AM

## 2018-09-26 NOTE — [facility] Group Notes (Signed)
LCSW Group Therapy Note  09/26/2018  1:00 PM   Type of Therapy and Topic:  Group Therapy:  Trust and Honesty   Participation Level:  Active   Description of Group:    In this group patients will be asked to explore the value of being honest.  Patients will be guided to discuss their thoughts, feelings, and behaviors related to honesty and trusting in others. Patients will process together how trust and honesty relate to forming relationships with peers, family members, and self. Each patient will be challenged to identify and express feelings of being vulnerable. Patients will discuss reasons why people are dishonest and identify alternative outcomes if one was truthful (to self or others). This group will be process-oriented, with patients participating in exploration of their own experiences, giving and receiving support, and processing challenge from other group members.   Therapeutic Goals: 1. Patient will identify why honesty is important to relationships and how honesty overall affects relationships.  2. Patient will identify a situation where they lied or were lied too and the  feelings, thought process, and behaviors surrounding the situation 3. Patient will identify the meaning of being vulnerable, how that feels, and how that correlates to being honest with self and others. 4. Patient will identify situations where they could have told the truth, but instead lied and explain reasons of dishonesty.   Summary of Patient Progress:  Pt was present and active in group.  Patient engaged in discussion.  Patient discussed how "respect" is a byproduct of being honest.  He discussed how body language and tone of voice are important factors in considering trust and honesty.  Patient reported how sometimes we have to remove others from our lives to be successful.      Therapeutic Modalities:   Cognitive Behavioral Therapy Solution Focused Therapy Motivational Interviewing Brief  Therapy  Assunta Curtis, MSW, LCSW 09/26/2018 10:40 AM

## 2018-09-27 MED ORDER — METOPROLOL TARTRATE 25 MG PO TABS
25.0000 mg | ORAL_TABLET | Freq: Two times a day (BID) | ORAL | Status: DC
  Administered 2018-09-27 – 2018-09-28 (×3): 25 mg via ORAL
  Filled 2018-09-27 (×3): qty 1

## 2018-09-27 NOTE — Progress Notes (Signed)
D: Patient has been calm and cooperative. Isolative. Denies SI, HI and AVH. A: Continue to monitor for safety. R: Safety maintained.

## 2018-09-27 NOTE — Plan of Care (Signed)
  Problem: Education: Goal: Utilization of techniques to improve thought processes will improve Outcome: Progressing Goal: Knowledge of the prescribed therapeutic regimen will improve Outcome: Progressing  D: Patient has been calm and cooperative. Isolative. Denies SI, HI and AVH. A: Continue to monitor for safety. R: Safety maintained.

## 2018-09-27 NOTE — Plan of Care (Signed)
D- Patient alert and oriented. Patient presents in a pleasant mood on assessment stating that he slept "pretty good" last night and had no complaints or concerns to voice to this Probation officer. Patient denies any signs/symptoms of depression or anxiety to this Probation officer. Patient also denies SI, HI, AVH, and pain at this time. Patient's goal for today is to "continue working on my discharge plan".  A- Scheduled medications administered to patient, per MD orders. Support and encouragement provided.  Routine safety checks conducted every 15 minutes.  Patient informed to notify staff with problems or concerns.  R- No adverse drug reactions noted. Patient contracts for safety at this time. Patient compliant with medications and treatment plan. Patient receptive, calm, and cooperative. Patient interacts well with others on the unit.  Patient remains safe at this time.  Problem: Education: Goal: Utilization of techniques to improve thought processes will improve Outcome: Progressing Goal: Knowledge of the prescribed therapeutic regimen will improve Outcome: Progressing   Problem: Coping: Goal: Coping ability will improve Outcome: Progressing Goal: Will verbalize feelings Outcome: Progressing   Problem: Self-Concept: Goal: Will verbalize positive feelings about self Outcome: Progressing Goal: Level of anxiety will decrease Outcome: Progressing   Problem: Education: Goal: Ability to make informed decisions regarding treatment will improve Outcome: Progressing   Problem: Coping: Goal: Coping ability will improve Outcome: Progressing   Problem: Health Behavior/Discharge Planning: Goal: Identification of resources available to assist in meeting health care needs will improve Outcome: Progressing   Problem: Medication: Goal: Compliance with prescribed medication regimen will improve Outcome: Progressing

## 2018-09-27 NOTE — Progress Notes (Signed)
[facility] MD Progress Note  09/27/2018 9:41 AM Sean Espinoza  MRN:  [id number] Subjective:  Patient is a 43 year old male with a past psychiatric history significant for major depression who was admitted on 09/25/2018 with worsening depression and suicidal ideation.  Objective: Patient is seen and examined.  Patient is 43 year old male with the above-stated past psychiatric history who was admitted to the [facility] psychiatric department on 09/25/2018 with suicidal ideation.  The patient had gotten into an argument with his wife, and that it escalated.  He had made a statement about killing himself.  He was admitted to the hospital.  Patient stated he felt a bit better this morning.  He was able to sleep last night.  He stated the unit was calm her last night.  He denied any suicidal ideation this a.m.  He stated he did forget to tell Dr. Toni Amend that he was taking metoprolol for hypertension, and we discussed that.  He denied any side effects to his current medications.  He denied any suicidal ideation this a.m.  His blood pressure is elevated today at 135/96.  He was able to sleep 8 hours last night.  No new laboratories.  He stated his multiple sclerosis was stable, and he currently receives immune modulating agents every 6 months by IV.  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 Atrial fibrillation (HCC)   . Hypertension   . MS (multiple sclerosis) (HCC)   . Vision abnormalities     Past Surgical History:  Procedure Laterality Date  . CERVICAL FUSION  2005, 2014   Family History:  Family History   Problem Relation Age of Onset  . Lupus Mother   . Healthy Father   . Lymphoma Maternal Grandfather   . Colon cancer Paternal Grandmother   . Kidney cancer Neg Hx   . Bladder Cancer Neg Hx   . Prostate cancer Neg Hx    Family Psychiatric  History: See admission H&P Social History:  Social History   Substance and Sexual Activity  Alcohol Use Yes   Comment: social     Social History   Substance and Sexual Activity  Drug Use No    Social History   Socioeconomic History  . Marital status: Married    Spouse name: Not on file  . Number of children: Not on file  . Years of education: Not on file  . Highest education level: Not on file  Occupational History  . Not on file  Social Needs  . Financial resource strain: Not on file  . Food insecurity    Worry: Not on file    Inability: Not on file  . Transportation needs    Medical: Not on file    Non-medical: Not on file  Tobacco Use  . Smoking status: Former Smoker    Quit date: 2003    Years since quitting: 17.5  . Smokeless tobacco: Never Used  Substance and Sexual Activity  . Alcohol use: Yes    Comment: social  . Drug use: No  . Sexual activity: Not on file  Lifestyle  . Physical activity    Days per week: Not on file  Minutes per session: Not on file  . Stress: Not on file  Relationships  . Social Musicianconnections    Talks on phone: Not on file    Gets together: Not on file    Attends religious service: Not on file    Active member of club or organization: Not on file    Attends meetings of clubs or organizations: Not on file    Relationship status: Not on file  Other Topics Concern  . Not on file  Social History Narrative  . Not on file   Additional Social History:                         Sleep: Good  Appetite:  Fair  Current Medications: Current Facility-Administered Medications  Medication Dose Route Frequency Provider Last Rate Last Dose  . acetaminophen (TYLENOL) tablet 650 mg  650  mg Oral Q6H PRN Ravi, Himabindu, MD      . alum & mag hydroxide-simeth (MAALOX/MYLANTA) 200-200-20 MG/5ML suspension 30 mL  30 mL Oral Q4H PRN Ravi, Himabindu, MD      . amphetamine-dextroamphetamine (ADDERALL) tablet 10 mg  10 mg Oral BID WC Clapacs, Jackquline DenmarkJohn T, MD   10 mg at 09/27/18 0849  . citalopram (CELEXA) tablet 20 mg  20 mg Oral Daily Clapacs, John T, MD   20 mg at 09/27/18 0849  . hydrOXYzine (ATARAX/VISTARIL) tablet 25 mg  25 mg Oral Q6H PRN Antonieta Pertlary, Kayleigh Broadwell Lawson, MD      . magnesium hydroxide (MILK OF MAGNESIA) suspension 30 mL  30 mL Oral Daily PRN Ravi, Himabindu, MD      . traZODone (DESYREL) tablet 50 mg  50 mg Oral QHS PRN Antonieta Pertlary, Yicel Shannon Lawson, MD       Facility-Administered Medications Ordered in Other Encounters  Medication Dose Route Frequency Provider Last Rate Last Dose  . gadopentetate dimeglumine (MAGNEVIST) injection 20 mL  20 mL Intravenous Once PRN Sater, Pearletha Furlichard A, MD        Lab Results: No results found for this or any previous visit (from the past 48 hour(s)).  Blood Alcohol level:  Lab Results  Component Value Date   ETH <10 09/24/2018   ETH <5 07/01/2016    Metabolic Disorder Labs: Lab Results  Component Value Date   HGBA1C 5.1 07/02/2016   MPG 100 07/02/2016   Lab Results  Component Value Date   PROLACTIN 7.6 01/30/2017   Lab Results  Component Value Date   CHOL 197 07/02/2016   TRIG 206 (H) 07/02/2016   HDL 29 (L) 07/02/2016   CHOLHDL 6.8 07/02/2016   VLDL 41 (H) 07/02/2016   LDLCALC 127 (H) 07/02/2016    Physical Findings: AIMS:  , ,  ,  ,    CIWA:    COWS:     Musculoskeletal: Strength & Muscle Tone: within normal limits Gait & Station: normal Patient leans: N/A  Psychiatric Specialty Exam: Physical Exam  Nursing note and vitals reviewed. Constitutional: He is oriented to person, place, and time. He appears well-developed and well-nourished.  HENT:  Head: Normocephalic and atraumatic.  Respiratory: Effort normal.  Neurological: He  is alert and oriented to person, place, and time.    ROS  Blood pressure (!) 151/92, pulse 97, temperature 98.1 F (36.7 C), temperature source Oral, resp. rate 18, height 6\' 1"  (1.854 m), weight 120.2 kg, SpO2 97 %.Body mass index is 34.96 kg/m.  General Appearance: Casual  Eye Contact:  Fair  Speech:  Normal Rate  Volume:  Decreased  Mood:  Depressed  Affect:  Congruent  Thought Process:  Coherent and Descriptions of Associations: Intact  Orientation:  Full (Time, Place, and Person)  Thought Content:  Logical  Suicidal Thoughts:  No  Homicidal Thoughts:  No  Memory:  Immediate;   Fair Recent;   Fair Remote;   Fair  Judgement:  Intact  Insight:  Fair  Psychomotor Activity:  Decreased  Concentration:  Concentration: Fair and Attention Span: Fair  Recall:  [employer] of Knowledge:  Fair  Language:  Fair  Akathisia:  Negative  Handed:  Right  AIMS (if indicated):     Assets:  Desire for Improvement Resilience  ADL's:  Intact  Cognition:  WNL  Sleep:  Number of Hours: 8     Treatment Plan Summary: Daily contact with patient to assess and evaluate symptoms and progress in treatment, Medication management and Plan : Patient is a 43 year old male with the above-stated past psychiatric history who is seen in follow-up.    Diagnosis: #1 major depression, recurrent, severe without psychotic features, #2 multiple sclerosis, #3 fatigue, #4 insomnia, #5 hypertension.  Patient is seen in follow-up.  Patient is a 43 year old male with the above-stated past psychiatric history.  He slept better last night.  Much of that had to do with the unit noise, but he did better last night.  He continues on Adderall and Celexa.  No change in that medication.  He did forget to tell Dr. Weber Cooks about his antihypertensives.  He stated he takes metoprolol tartrate 25 mg p.o. twice daily, and we will start that today.  No other changes in his medications. 1.  Continue citalopram 20 mg p.o. daily for  depression and anxiety. 2.  Continue hydroxyzine 25 mg p.o. every 6 hours as needed anxiety. 3.  Continue trazodone 50 mg p.o. nightly as needed insomnia. 4.  Continue Adderall 10 mg p.o. twice daily for fatigue and attentional problems. 5.  Add metoprolol tartrate 25 mg p.o. twice daily for hypertension. 6.  Disposition planning-in progress.  Sharma Covert, MD 09/27/2018, 9:41 AM

## 2018-09-27 NOTE — BHH Group Notes (Signed)
LCSW Group Therapy Note  09/27/2018 11:26 AM   Type of Therapy and Topic: Group Therapy: Feelings Around Returning Home & Establishing a Supportive Framework and Supporting Oneself When Supports Not Available   Participation Level:  Active   Description of Group:  Patients first processed thoughts and feelings about upcoming discharge. These included fears of upcoming changes, lack of change, new living environments, judgements and expectations from others and overall stigma of mental health issues. The group then discussed the definition of a supportive framework, what that looks and feels like, and how do to discern it from an unhealthy non-supportive network. The group identified different types of supports as well as what to do when your family/friends are less than helpful or unavailable   Therapeutic Goals  1. Patient will identify one healthy supportive network that they can use at discharge. 2. Patient will identify one factor of a supportive framework and how to tell it from an unhealthy network. 3. Patient able to identify one coping skill to use when they do not have positive supports from others. 4. Patient will demonstrate ability to communicate their needs through discussion and/or role plays.   Summary of Patient Progress:  Pt was appropriate and respectful in group. Pt was able to identify that he is scheduled to discharge tomorrow and that he feels good about it. Pt reported that he is returning to the same expectations and has no apprehensions about being discharged. Pt identified the following coping strategies: go for a ride, sleep, supportive friends and family, and support groups.     Therapeutic Modalities Cognitive Behavioral Therapy Motivational Interviewing    Evalina Field, MSW, LCSW Clinical Social Work 09/27/2018 11:26 AM

## 2018-09-28 MED ORDER — CITALOPRAM HYDROBROMIDE 20 MG PO TABS: 20.0000 mg | ORAL_TABLET | Freq: Every day | ORAL | 1 refills | Status: DC

## 2018-09-28 MED ORDER — METOPROLOL TARTRATE 25 MG PO TABS: 25.0000 mg | ORAL_TABLET | Freq: Two times a day (BID) | ORAL | 1 refills | Status: DC

## 2018-09-28 MED ORDER — TRAZODONE HCL 50 MG PO TABS: 50.0000 mg | ORAL_TABLET | Freq: Every evening | ORAL | 1 refills | Status: DC | PRN

## 2018-09-28 NOTE — Plan of Care (Signed)
  Problem: Education: Goal: Utilization of techniques to improve thought processes will improve Outcome: Progressing Goal: Knowledge of the prescribed therapeutic regimen will improve Outcome: Progressing  D: Patient has been isolative to room and to self. Denies SI, HI and AVH. Mood is sad. Affect is flat. Voices no complaints. A: Continue to monitor for safety. R: Safety maintained.

## 2018-09-28 NOTE — BHH Suicide Risk Assessment (Signed)
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   HENT: Negative.   Eyes: Negative.   Respiratory: Negative.   Cardiovascular: Negative.   Gastrointestinal: Negative.   Musculoskeletal: Negative.   Skin: Negative.   Neurological: Negative.   Psychiatric/Behavioral: Negative.     Blood pressure (!) 150/102, pulse 89, temperature 97.7 F (36.5 C), temperature source Oral, resp. rate 18, height 6\' 1"  (1.854 m), weight 120.2 kg, SpO2 98 %.Body mass index is 34.96 kg/m.  General Appearance: Casual  Eye Contact::  Good  Speech:  Clear and Coherent409  Volume:  Normal  Mood:  Euthymic  Affect:  Congruent  Thought Process:  Coherent  Orientation:  Full (Time, Place, and Person)  Thought Content:  Logical  Suicidal Thoughts:  No  Homicidal Thoughts:  No  Memory:  Immediate;   Fair Recent;   Fair Remote;   Fair  Judgement:  Fair  Insight:  Fair  Psychomotor Activity:  Normal  Concentration:  Fair  Recall:  [employer] of Knowledge:Fair  Language: Fair  Akathisia:  No  Handed:  Right  AIMS (if indicated):     Assets:  Desire for Improvement  Sleep:  Number of Hours: 7.5  Cognition: WNL  ADL's:  Intact   Mental Status Per Nursing Assessment::   On Admission:  Suicidal ideation indicated by others  Demographic Factors:  Male  Loss Factors: Decline in physical health  Historical Factors: Impulsivity  Risk Reduction Factors:   Sense of responsibility to family, Religious beliefs  about death, Living with another person, especially a relative, Positive social support and Positive therapeutic relationship  Continued Clinical Symptoms:  Depression:   Impulsivity  Cognitive Features That Contribute To Risk:  None    Suicide Risk:  Minimal: No identifiable suicidal ideation.  Patients presenting with no risk factors but with morbid ruminations; may be classified as minimal risk based on the severity of the depressive symptoms  Follow-up Information    [facility] Follow up.   Specialty: Behavioral Health Why: A referral has been submitted, they will contact you to schedule an appointment. Thank you. Contact information: [place] [phone]          Plan Of Care/Follow-up recommendations:  Activity:  Activity as tolerated Diet:  Regular diet Other:  Referral for follow-up in [facility] continue on medication continue working with primary providers about working on anger problems and depression.  Keep firearms out of the house.  Alethia Berthold, MD 09/28/2018, 9:08 AM

## 2018-09-28 NOTE — Progress Notes (Signed)
Patient ID: TYLIN FORCE, male   DOB: March 13, 1976, 43 y.o.   MRN: [id number]  Discharge Note:  Patient denies SI/HI/AVH at this time. Discharge instructions, AVS, prescriptions, and transition record gone over with patient. Patient agrees to comply with medication management, follow-up visit, and outpatient therapy. Patient belongings returned to patient. Patient questions and concerns addressed and answered. Patient ambulatory off unit. Patient discharged to home with wife.

## 2018-09-28 NOTE — BHH Group Notes (Signed)
LCSW Group Therapy Note   09/28/2018 1:00 PM  Type of Therapy and Topic:  Group Therapy:  Overcoming Obstacles   Participation Level:  Did Not Attend   Description of Group:    In this group patients will be encouraged to explore what they see as obstacles to their own wellness and recovery. They will be guided to discuss their thoughts, feelings, and behaviors related to these obstacles. The group will process together ways to cope with barriers, with attention given to specific choices patients can make. Each patient will be challenged to identify changes they are motivated to make in order to overcome their obstacles. This group will be process-oriented, with patients participating in exploration of their own experiences as well as giving and receiving support and challenge from other group members.   Therapeutic Goals: 1. Patient will identify personal and current obstacles as they relate to admission. 2. Patient will identify barriers that currently interfere with their wellness or overcoming obstacles.  3. Patient will identify feelings, thought process and behaviors related to these barriers. 4. Patient will identify two changes they are willing to make to overcome these obstacles:      Summary of Patient Progress X   Therapeutic Modalities:   Cognitive Behavioral Therapy Solution Focused Therapy Motivational Interviewing Relapse Prevention Therapy  Sean Espinoza, MSW, LCSW 09/28/2018 11:56 AM    

## 2018-09-28 NOTE — Progress Notes (Signed)
D: Patient has been isolative to room and to self. Denies SI, HI and AVH. Mood is sad. Affect is flat. Voices no complaints. A: Continue to monitor for safety. R: Safety maintained.

## 2018-09-28 NOTE — Progress Notes (Signed)
Recreation Therapy Notes  INPATIENT RECREATION TR PLAN  Patient Details Name: Sean Espinoza MRN: [id number] DOB: [birth year] Today's Date: 09/28/2018  Rec Therapy Plan Is patient appropriate for Therapeutic Recreation?: Yes Treatment times per week: at least 3 Estimated Length of Stay: 5-7 days TR Treatment/Interventions: Group participation (Comment)  Discharge Criteria Pt will be discharged from therapy if:: Discharged Treatment plan/goals/alternatives discussed and agreed upon by:: Patient/family  Discharge Summary Short term goals set: Patient will demonstrate improved communication skills by spontaneously contributing to 2 group discussions within 5 recreation therapy group sessions Short term goals met: Adequate for discharge Progress toward goals comments: Groups attended Which groups?: Other (Comment)(Self-care) Reason goals not met: N/A Therapeutic equipment acquired: N/A Reason patient discharged from therapy: Discharge from hospital Pt/family agrees with progress & goals achieved: Yes Date patient discharged from therapy: 09/28/18   Ahmoni Edge 09/28/2018, 11:19 AM

## 2018-09-28 NOTE — Progress Notes (Signed)
  [facility] Adult Case Management Discharge Plan :  Will you be returning to the same living situation after discharge:  Yes,  pt is returning home. At discharge, do you have transportation home?: Yes,  pt's wife will provide transportation. Do you have the ability to pay for your medications: Yes,  BCBS  Release of information consent forms completed and in the chart;  Patient's signature needed at discharge.  Patient to Follow up at: Follow-up Information    [facility] Follow up.   Specialty: Behavioral Health Why: A referral has been submitted, they will contact you to schedule an appointment. Thank you. If you have not been contacted within 7-10 business days call Scheduling Coordinator, Jonelle Sidle, [phone]. Contact information: [place] [phone]          Next level of care provider has access to [place] and Suicide Prevention discussed: Yes,  SPE completed with the patient's wife.     Has patient been referred to the Quitline?: N/A patient is not a smoker  Patient has been referred for addiction treatment: [place], LCSW 09/28/2018, 9:13 AM

## 2018-09-28 NOTE — Discharge Summary (Signed)
Physician Discharge Summary Note  Patient:  Sean Espinoza is an 43 y.o., male MRN:  [id number] DOB:  06/29/1975 Patient phone:  [phone] (home)  Patient address:   [address] [place] [address],  Total Time spent with patient: 45 minutes  Date of Admission:  09/24/2018 Date of Discharge: September 28, 2018  Reason for Admission: Admitted through the emergency room because of concern about suicidality patient agitated and took a gun when he left home.  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  Depression and anxiety symptoms.  History of treatment with amphetamines for ADHD possibly in part related to multiple sclerosis.  Patient has recently had an increase in his dose of Adderall.  Past Medical History:  Past Medical History:  Diagnosis Date  . Atrial fibrillation (HCC)   . Hypertension   . MS (multiple sclerosis) (HCC)   . Vision abnormalities     Past Surgical History:  Procedure Laterality Date  . CERVICAL FUSION  2005, 2014   Family History:  Family History  Problem Relation Age of Onset  . Lupus Mother   . Healthy Father   . Lymphoma Maternal Grandfather   . Colon cancer Paternal Grandmother   . Kidney cancer Neg Hx   . Bladder Cancer Neg Hx   . Prostate cancer Neg Hx    Family Psychiatric  History: None reported Social History:  Social History   Substance and Sexual Activity  Alcohol Use Yes   Comment: social     Social History   Substance and Sexual Activity  Drug Use No    Social History   Socioeconomic History  . Marital status: Married    Spouse name: Not on file  . Number of children: Not on file  . Years of education: Not on file   . Highest education level: Not on file  Occupational History  . Not on file  Social Needs  . Financial resource strain: Not on file  . Food insecurity    Worry: Not on file    Inability: Not on file  . Transportation needs    Medical: Not on file    Non-medical: Not on file  Tobacco Use  . Smoking status: Former Smoker    Quit date: 2003    Years since quitting: 17.5  . Smokeless tobacco: Never Used  Substance and Sexual Activity  . Alcohol use: Yes    Comment: social  . Drug use: No  . Sexual activity: Not on file  Lifestyle  . Physical activity    Days per week: Not on file    Minutes per session: Not on file  . Stress: Not on file  Relationships  . Social Musician on phone: Not on file    Gets together: Not on file    Attends religious service: Not on file    Active member of club or organization: Not on file    Attends meetings of clubs or organizations: Not on file    Relationship status: Not on file  Other Topics Concern  . Not on file  Social History Narrative  . Not on file    Hospital Course: Patient admitted to the psychiatric service.  15-minute checks employed.  Patient did  not show any dangerous or aggressive behavior.  No acting out no suicidal behavior.  He was cooperative with assessment and treatment.  Based on my assessment it sounds like there is a good chance that the rapid increase in Adderall dosing may have contributed to his irritability anger and impulsivity.  I advised him that he would be better off I think at the previous dose of just 20 mg a day.  We did continue or start citalopram for depression and anxiety.  Patient was cooperative throughout hospital stay.  Showed no dangerous behavior.  At discharge was agreeable to outpatient referral for follow-up.  Physical Findings: AIMS:  , ,  ,  ,    CIWA:    COWS:     Musculoskeletal: Strength & Muscle Tone: within normal limits Gait & Station: normal Patient leans:  N/A  Psychiatric Specialty Exam: Physical Exam  Nursing note and vitals reviewed. Constitutional: He appears well-developed and well-nourished.  HENT:  Head: Normocephalic and atraumatic.  Eyes: Pupils are equal, round, and reactive to light. Conjunctivae are normal.  Neck: Normal range of motion.  Cardiovascular: Regular rhythm and normal heart sounds.  Respiratory: Effort normal.  GI: Soft.  Musculoskeletal: Normal range of motion.  Neurological: He is alert.  Skin: Skin is warm and dry.  Psychiatric: He has a normal mood and affect. His behavior is normal. Judgment and thought content normal.    Review of Systems  Constitutional: Negative.   HENT: Negative.   Eyes: Negative.   Respiratory: Negative.   Cardiovascular: Negative.   Gastrointestinal: Negative.   Musculoskeletal: Negative.   Skin: Negative.   Neurological: Negative.   Psychiatric/Behavioral: Negative.     Blood pressure (!) 146/103, pulse 64, temperature 97.7 F (36.5 C), temperature source Oral, resp. rate 18, height 6\' 1"  (1.854 m), weight 120.2 kg, SpO2 98 %.Body mass index is 34.96 kg/m.  General Appearance: Casual  Eye Contact:  Good  Speech:  Clear and Coherent  Volume:  Normal  Mood:  Euthymic  Affect:  Congruent  Thought Process:  Goal Directed  Orientation:  Full (Time, Place, and Person)  Thought Content:  Logical  Suicidal Thoughts:  No  Homicidal Thoughts:  No  Memory:  Immediate;   Fair Recent;   Fair Remote;   Fair  Judgement:  Fair  Insight:  Fair  Psychomotor Activity:  Decreased  Concentration:  Concentration: Fair  Recall:  Fair  Fund of Knowledge:  Fair  Language:  Fair  Akathisia:  No  Handed:  Right  AIMS (if indicated):     Assets:  Desire for Improvement Housing Social Support  ADL's:  Intact  Cognition:  WNL  Sleep:  Number of Hours: 7.5        Has this patient used any form of tobacco in the last 30 days? (Cigarettes, Smokeless Tobacco, Cigars, and/or Pipes)  Yes, Yes, A prescription for an FDA-approved tobacco cessation medication was offered at discharge and the patient refused  Blood Alcohol level:  Lab Results  Component Value Date   [facility] 09/24[facility]2018   ETH <5 07/01/2016    Metabolic Disorder Labs:  Lab Results  Component Value Date   HGBA1C 5.1 07/02/2016   MPG 100 07/02/2016   Lab Results  Component Value Date   PROLACTIN 7.6 01/30/2017   Lab Results  Component Value Date   CHOL 197 07/02/2016   TRIG 206 (H) 07/02/2016   HDL 29 (L) 07/02/2016   CHOLHDL 6.8 07/02/2016   VLDL 41 (  H) 07/02/2016   LDLCALC 127 (H) 07/02/2016    See Psychiatric Specialty Exam and Suicide Risk Assessment completed by Attending Physician prior to discharge.  Discharge destination:  Home  Is patient on multiple antipsychotic therapies at discharge:  No   Has Patient had three or more failed trials of antipsychotic monotherapy by history:  No  Recommended Plan for Multiple Antipsychotic Therapies: NA  Discharge Instructions    Diet - low sodium heart healthy   Complete by: As directed    Increase activity slowly   Complete by: As directed      Allergies as of 09/28/2018   No Known Allergies     Medication List    STOP taking these medications   omeprazole 40 MG capsule Commonly known as: PRILOSEC     TAKE these medications     Indication  amphetamine-dextroamphetamine 15 MG tablet Commonly known as: Adderall Take one po three times a day  Indication: Attention Deficit Hyperactivity Disorder   citalopram 20 MG tablet Commonly known as: CELEXA Take 1 tablet (20 mg total) by mouth daily.  Indication: Depression   metoprolol tartrate 25 MG tablet Commonly known as: LOPRESSOR Take 1 tablet (25 mg total) by mouth 2 (two) times daily. What changed:   medication strength  how much to take  Indication: High Blood Pressure Disorder   Ocrevus 300 MG/10ML injection Generic drug: ocrelizumab Inject into the vein once.   Indication: Relapsing, Remitting Multiple Sclerosis   traZODone 50 MG tablet Commonly known as: DESYREL Take 1 tablet (50 mg total) by mouth at bedtime as needed for sleep.  Indication: [facility] Follow up.   Specialty: Behavioral Health Why: A referral has been submitted, they will contact you to schedule an appointment. Thank you. If you have not been contacted within 7-10 business days call Scheduling Coordinator, Jonelle Sidle, [phone]. Contact information: [address] [place] [phone]          Follow-up recommendations:  Activity:  Activity as tolerated Diet:  Regular diet Other:  Referred for follow-up at [facility]  Comments: Continue current medicine.  Follow-up with psychiatric Associates and outpatient neurologic and medical providers.  Patient showed good insight and was agreeable to treatment.  Signed: Alethia Berthold, MD 09/28/2018, 7:01 PM

## 2018-09-28 NOTE — Progress Notes (Signed)
Recreation Therapy Notes   Date: 09/28/2018  Time: 9:30 am   Location: Craft room   Behavioral response: N/A   Intervention Topic: Stress  Discussion/Intervention: Patient did not attend group.   Clinical Observations/Feedback:  Patient did not attend group.   Milicent Acheampong LRT/CTRS        Sean Espinoza 09/28/2018 10:49 AM

## 2018-10-22 ENCOUNTER — Ambulatory Visit: Payer: BC Managed Care – PPO | Admitting: Licensed Clinical Social Worker

## 2018-10-29 ENCOUNTER — Encounter: Payer: Self-pay | Admitting: *Deleted

## 2018-10-29 ENCOUNTER — Other Ambulatory Visit: Payer: Self-pay | Admitting: Neurology

## 2018-10-29 ENCOUNTER — Telehealth: Payer: Self-pay | Admitting: Neurology

## 2018-10-29 MED ORDER — AMPHETAMINE-DEXTROAMPHETAMINE 15 MG PO TABS: ORAL_TABLET | ORAL | 0 refills | Status: DC

## 2018-10-29 NOTE — Telephone Encounter (Signed)
Called pt. He was last seen 08/26/18 and did not have a follow up scheduled yet. Made one for 12/29/18 at 11am with Dr. Felecia Shelling.   Checked drug registry. He last refiled 09/16/18 #90. Not receiving from other MD's.

## 2018-10-29 NOTE — Addendum Note (Signed)
Addended by: Rossie Muskrat L on: 10/29/2018 10:12 AM   Modules accepted: Orders

## 2018-10-29 NOTE — Telephone Encounter (Signed)
Pt states he is currently under restrictions for work until 08-03, he is asking for a new letter effective 08-04 that the restrictions have been lifted re: work as of 08-04

## 2018-10-29 NOTE — Telephone Encounter (Signed)
Pt is asking for a refill on his amphetamine-dextroamphetamine (ADDERALL) 15 MG tablet [facility] [phone]

## 2018-10-29 NOTE — Telephone Encounter (Signed)
Called pt. He wants letter released to [facility] stating restriction lifted that were previously in place for his work. His strength on the left side has improved to almost 100% and he no longer needs restrictions. Advised we will formulate letter and release to mychart for him. He verbalized understanding

## 2018-10-29 NOTE — Telephone Encounter (Addendum)
Spoke with Dr. Felecia Shelling. Ok to write letter. Letter written and released to mychart.

## 2018-11-12 ENCOUNTER — Ambulatory Visit (INDEPENDENT_AMBULATORY_CARE_PROVIDER_SITE_OTHER): Payer: BC Managed Care – PPO | Admitting: Psychiatry

## 2018-11-12 ENCOUNTER — Other Ambulatory Visit: Payer: Self-pay

## 2018-11-12 ENCOUNTER — Encounter: Payer: Self-pay | Admitting: Psychiatry

## 2018-11-12 DIAGNOSIS — F5105 Insomnia due to other mental disorder: Secondary | ICD-10-CM

## 2018-11-12 DIAGNOSIS — F320 Major depressive disorder, single episode, mild: Secondary | ICD-10-CM | POA: Insufficient documentation

## 2018-11-12 MED ORDER — TRAZODONE HCL 50 MG PO TABS: 50.0000 mg | ORAL_TABLET | Freq: Every evening | ORAL | 1 refills | Status: DC | PRN

## 2018-11-12 MED ORDER — CITALOPRAM HYDROBROMIDE 20 MG PO TABS: 20.0000 mg | ORAL_TABLET | Freq: Every day | ORAL | 1 refills | Status: DC

## 2018-11-12 NOTE — Progress Notes (Signed)
Virtual Visit via Video Note  I connected with Sean MatinBrian P Edrington on 11/12/18 at 11:00 AM EDT by a video enabled telemedicine application and verified that I am speaking with the correct person using two identifiers.   I discussed the limitations of evaluation and management by telemedicine and the availability of in person appointments. The patient expressed understanding and agreed to proceed.   I discussed the assessment and treatment plan with the patient. The patient was provided an opportunity to ask questions and all were answered. The patient agreed with the plan and demonstrated an understanding of the instructions.   The patient was advised to call back or seek an in-person evaluation if the symptoms worsen or if the condition fails to improve as anticipated.    Psychiatric Initial Adult Assessment   Patient Identification: Sean Espinoza MRN:  [id number] Date of Evaluation:  11/12/2018 Referral Source: Dr.Clapacs Chief Complaint:   Chief Complaint    Establish Care     Visit Diagnosis:    ICD-10-CM   1. MDD (major depressive disorder), single episode, mild (HCC)  F32.0 TSH    citalopram (CELEXA) 20 MG tablet    traZODone (DESYREL) 50 MG tablet  2. Insomnia due to mental condition  F51.05 citalopram (CELEXA) 20 MG tablet    traZODone (DESYREL) 50 MG tablet    History of Present Illness:  Sean JohnBrian is a 43 year old Caucasian male, employed, married, lives in [place], has a history of depression, multiple sclerosis, atrial fibrillation was evaluated by telemedicine today.  Patient [facility] from 6/25-6/29/2020.  I have reviewed medical records from Dr. Toni Amendlapacs-' patient with major depressive disorder, MS, admitted because of concerns about suicidality.  Patient was started on Celexa.  Patient's symptoms stabilized and discharged with outpatient follow-up.'  Patient today appeared to be alert, oriented to person place time and situation.  Patient  denied any significant depressive symptoms.  He reports he is compliant on his Celexa.  He reports that he was having an argument that day prior to his hospital admission with his wife.  He denies having any suicidal thoughts at that time.  His gun is currently with [place] [employer].  Patient denies any sadness, crying spells or sleep problems.  He did struggle with sleep issues prior to his hospital admission.  He reports it was more so because of a lot of noise from the upstairs apartment.  He currently reports he is able to sleep good with the trazodone.  Patient denies any anxiety symptoms.  Patient denies any history of trauma.  Patient denies any perceptual disturbances.  Patient denies any suicidality or homicidality.  Patient reports a history of multiple sclerosis.  He reports it is relapsing and remitting and recently had left-sided weakness.  He was out of work from February to August 2020.  Currently he is back at work.  He reports work is going okay so far.  He continues to follow-up with his neurologist for his MS which is currently under control with medications.  Patient denies any substance abuse problems.  He reports good relationship with his wife. Associated Signs/Symptoms: Depression Symptoms:  depressed mood, anhedonia, insomnia, difficulty concentrating, currently stable on medications (Hypo) Manic Symptoms:  denies Anxiety Symptoms:  denies Psychotic Symptoms:  denies PTSD Symptoms: Negative  Past Psychiatric History: Patient was recently [facility] on 09/24/2018 for possible suicidality.  Patient denies any suicide attempts.  Previous Psychotropic Medications: Yes  Celexa, trazodone  Substance  Abuse History in the last 12 months:  No.  Consequences of Substance Abuse: Negative  Past Medical History:  Past Medical History:  Diagnosis Date  . Atrial fibrillation ([place])   . Hypertension   . MS (multiple sclerosis)  ([place])   . Vision abnormalities     Past Surgical History:  Procedure Laterality Date  . CERVICAL FUSION  2005, 2014    Family Psychiatric History: Patient reports history of alcoholism in his paternal great grandfather, paternal uncles  Family History:  Family History  Problem Relation Age of Onset  . Lupus Mother   . Healthy Father   . Lymphoma Maternal Grandfather   . Colon cancer Paternal Grandmother   . Alcohol abuse Paternal Uncle   . Alcohol abuse Paternal Grandfather   . Kidney cancer Neg Hx   . Bladder Cancer Neg Hx   . Prostate cancer Neg Hx     Social History:   Social History   Socioeconomic History  . Marital status: Married    Spouse name: teresa  . Number of children: 3  . Years of education: Not on file  . Highest education level: High school graduate  Occupational History  . Not on file  Social Needs  . Financial resource strain: Not hard at all  . Food insecurity    Worry: Never true    Inability: Never true  . Transportation needs    Medical: No    Non-medical: No  Tobacco Use  . Smoking status: Former Smoker    Quit date: 2003    Years since quitting: 17.6  . Smokeless tobacco: Never Used  Substance and Sexual Activity  . Alcohol use: Yes    Comment: social  . Drug use: No  . Sexual activity: Yes  Lifestyle  . Physical activity    Days per week: 4 days    Minutes per session: 60 min  . Stress: Only a little  Relationships  . Social Herbalist on phone: Not on file    Gets together: Not on file    Attends religious service: Never    Active member of club or organization: No    Attends meetings of clubs or organizations: Never    Relationship status: Married  Other Topics Concern  . Not on file  Social History Narrative  . Not on file    Additional Social History: Patient is married, lives in [place] with his wife and 24 year old daughter.  Patient has a 42 year old biological son from a previous marriage and a  28 year old stepson who lives in separate households.  Patient reports he was raised by his mother.  He did not know his father much.  He continues to have a good relationship with his mother.  Patient has a Armed forces training and education officer and a biological sister.  Patient works at [employer] as a Freight forwarder.  Patient denies any legal problems. Allergies:  No Known Allergies  Metabolic Disorder Labs: Lab Results  Component Value Date   HGBA1C 5.1 07/02/2016   MPG 100 07/02/2016   Lab Results  Component Value Date   PROLACTIN 7.6 01/30/2017   Lab Results  Component Value Date   CHOL 197 07/02/2016   TRIG 206 (H) 07/02/2016   HDL 29 (L) 07/02/2016   CHOLHDL 6.8 07/02/2016   VLDL 41 (H) 07/02/2016   LDLCALC 127 (H) 07/02/2016   Lab Results  Component Value Date   TSH 1.620 05/04/2018    Therapeutic Level Labs: No results found for: LITHIUM No  results found for: CBMZ No results found for: VALPROATE  Current Medications: Current Outpatient Medications  Medication Sig Dispense Refill  . amphetamine-dextroamphetamine (ADDERALL) 15 MG tablet Take one po three times a day 90 tablet 0  . citalopram (CELEXA) 20 MG tablet Take 1 tablet (20 mg total) by mouth daily. 30 tablet 1  . metoprolol tartrate (LOPRESSOR) 25 MG tablet Take 1 tablet (25 mg total) by mouth 2 (two) times daily. 60 tablet 1  . ocrelizumab (OCREVUS) 300 MG/10ML injection Inject into the vein once.    . traZODone (DESYREL) 50 MG tablet Take 1 tablet (50 mg total) by mouth at bedtime as needed for sleep. 30 tablet 1   No current facility-administered medications for this visit.    Facility-Administered Medications Ordered in Other Visits  Medication Dose Route Frequency Provider Last Rate Last Dose  . gadopentetate dimeglumine (MAGNEVIST) injection 20 mL  20 mL Intravenous Once PRN Sater, Pearletha Furlichard A, MD        Musculoskeletal: Strength & Muscle Tone: UTA Gait & Station: Observed as sitting Patient leans: N/A  Psychiatric Specialty  Exam: Review of Systems  Psychiatric/Behavioral: Negative for depression. The patient is not nervous/anxious.   All other systems reviewed and are negative.   There were no vitals taken for this visit.There is no height or weight on file to calculate BMI.  General Appearance: Casual  Eye Contact:  Fair  Speech:  Normal Rate  Volume:  Normal  Mood:  Euthymic  Affect:  Congruent  Thought Process:  Goal Directed and Descriptions of Associations: Intact  Orientation:  Full (Time, Place, and Person)  Thought Content:  Logical  Suicidal Thoughts:  No  Homicidal Thoughts:  No  Memory:  Immediate;   Fair Recent;   Fair Remote;   Fair  Judgement:  Fair  Insight:  Fair  Psychomotor Activity:  Normal  Concentration:  Concentration: Fair and Attention Span: Fair  Recall:  Fis[employer]Fair  Fund of Knowledge:Fair  Language: Fair  Akathisia:  No  Handed:  Right  AIMS (if indicated):  Denies tremors, stiffness  Assets:  Communication Skills Desire for Improvement Social Support  ADL's:  Intact  Cognition: WNL  Sleep:  Fair   Screenings: AUDIT     Admission (Discharged[facility]  Alcohol Use Disorder Identification Test Final Score (AUDIT)  0    Mini-Mental     Office Visit from 11/04/2016 in [facility]  Total Score (max 30 points )  29    PHQ2-9     [facility]12/[facility]  PHQ-2 Total Score  0      Assessment and Plan: Sean JohnBrian is a 43 year old Caucasian male, married, employed, lives in [place], has a history of MDD, atrial fibrillation, multiple sclerosis was evaluated by telemedicine today.  Patient is biologically predisposed given his family history as well as chronic medical problems.  He has psychosocial stressors of physical problems due to his MS.  Patient has good social support system, currently denies suicidality or previous suicide attempts.  Pt also denies any substance  abuse problems.  He is motivated to stay in treatment as well as pursue psychotherapy sessions.  Plan For MDD- stable PHQ 9 equals 0 Continue Celexa 20 mg p.o. daily  For insomnia-stable Trazodone 50 mg p.o. nightly  Patient to continue psychotherapy sessions-has upcoming appointment with therapist here in clinic.  Will order labs-TSH.  Will mail lab slip to him.  I have  reviewed medical records in [facility] dated 6/25-6/29/2020 per Dr. Toni Amendlapacs as summarized above.  Follow-up in clinic in 1 month or sooner if needed.  September 18 at 10:45 AM.  I have spent atleast 40 minutes non face to face with patient today. More than 50 % of the time was spent for psychoeducation and supportive psychotherapy and care coordination.  This note was generated in part or whole with voice recognition software. Voice recognition is usually quite accurate but there are transcription errors that can and very often do occur. I apologize for any typographical errors that were not detected and corrected.       Jomarie LongsSaramma Nishan Ovens, MD 8/13/20202:17 PM

## 2018-11-17 ENCOUNTER — Other Ambulatory Visit: Payer: Self-pay

## 2018-11-17 ENCOUNTER — Ambulatory Visit (INDEPENDENT_AMBULATORY_CARE_PROVIDER_SITE_OTHER): Payer: BC Managed Care – PPO | Admitting: Licensed Clinical Social Worker

## 2018-11-17 ENCOUNTER — Encounter: Payer: Self-pay | Admitting: Licensed Clinical Social Worker

## 2018-11-17 DIAGNOSIS — F320 Major depressive disorder, single episode, mild: Secondary | ICD-10-CM

## 2018-11-17 NOTE — Progress Notes (Signed)
Comprehensive Clinical Assessment (CCA) Note  11/17/2018 Sean Espinoza [id number]  Visit Diagnosis:      ICD-10-CM   1. MDD (major depressive disorder), single episode, mild (HCC)  F32.0       CCA Part One  Part One has been completed on paper by the patient.  (See scanned document in Chart Review)  CCA Part Two A  Intake/Chief Complaint:  CCA Intake With Chief Complaint CCA Part Two Date: 11/17/18 CCA Part Two Time: 1422 Chief Complaint/Presenting Problem: "I was referred by inpatient services at [facility]. Two years ago I was diagnosed with MS. Back in February of this year, I was taken out of work because I had a bad flare up. 3-4 months later, I was only sleeping 2-3 hours a night. My wife and  I had an argument the night I was admitted and I said I was going to commit suicide." Patients Currently Reported Symptoms/Problems: "I don't really have any now. The medication I'm on has worked wonders." Collateral Involvement: n/a Individual's Strengths: good Garment/textile technologist Preferences: n/a Individual's Abilities: fair insight Type of Services Patient Feels Are Needed: medication management Initial Clinical Notes/Concerns: n/a  Mental Health Symptoms Depression:  Depression: Difficulty Concentrating, Fatigue, Increase/decrease in appetite, Irritability  Mania:  Mania: N/A  Anxiety:   Anxiety: Difficulty concentrating, Fatigue, Irritability, Tension, Worrying  Psychosis:  Psychosis: N/A  Trauma:  Trauma: N/A  Obsessions:  Obsessions: N/A  Compulsions:  Compulsions: N/A  Inattention:  Inattention: N/A  Hyperactivity/Impulsivity:  Hyperactivity/Impulsivity: N/A  Oppositional/Defiant Behaviors:  Oppositional/Defiant Behaviors: N/A  Borderline Personality:  Emotional Irregularity: N/A  Other Mood/Personality Symptoms:      Mental Status Exam Appearance and self-care  Stature:  Stature: Average  Weight:  Weight: Average weight  Clothing:   Clothing: Neat/clean  Grooming:  Grooming: Normal  Cosmetic use:  Cosmetic Use: Age appropriate  Posture/gait:  Posture/Gait: Normal  Motor activity:  Motor Activity: Not Remarkable  Sensorium  Attention:  Attention: Normal  Concentration:  Concentration: Normal  Orientation:  Orientation: X5  Recall/memory:  Recall/Memory: Normal  Affect and Mood  Affect:  Affect: Anxious  Mood:  Mood: Anxious  Relating  Eye contact:  Eye Contact: Normal  Facial expression:  Facial Expression: Anxious  Attitude toward examiner:  Attitude Toward Examiner: Cooperative  Thought and Language  Speech flow: Speech Flow: Normal  Thought content:  Thought Content: Appropriate to mood and circumstances  Preoccupation:     Hallucinations:     Organization:     Company secretary of Knowledge:  Fund of Knowledge: Average  Intelligence:  Intelligence: Average  Abstraction:  Abstraction: Normal  Judgement:  Judgement: Normal  Reality Testing:  Reality Testing: Realistic  Insight:  Insight: Good  Decision Making:  Decision Making: Normal  Social Functioning  Social Maturity:  Social Maturity: Responsible  Social Judgement:  Social Judgement: Normal  Stress  Stressors:  Stressors: Transitions  Coping Ability:  Coping Ability: Normal  Skill Deficits:     Supports:      Family and Psychosocial History: Family history Marital status: Married Number of Years Married: 10 What types of issues is patient dealing with in the relationship?: none reported Additional relationship information: 2nd marriage: divorced first marriage 2007 Are you sexually active?: Yes What is your sexual orientation?: heterosexual Has your sexual activity been affected by drugs, alcohol, medication, or emotional stress?: n/a Does patient have children?: Yes How many children?: 3 How is patient's relationship with their children?: 1 biological child,  2 step children: "I think it's better now than it has been in the past.  My biologicla son lives with his mother and he comes up for a month or month and a half at a time. My step-daughter lives in the home with us, and she's a typical 41 year old and stays to herself. Then, my step-son he comes down for a period of time."  Childhood History:  Childhood History By whom was/is the patient raised?: Mother, Grandparents Additional childhood history information: "I was raised by my mother and grandmother. MY father was around, but he was a drug addictecd, self absorbed, piece of crap." Description of patient's relationship with caregiver when they were a child: Mom: "Typical. I was rebellious until I was 15 or 16. I started realizing that she was right about a lot of things." step-father: "came into the picture when I was 14. He helped me a ot know what an actual father should be doing." Patient's description of current relationship with people who raised him/her: Mom: "I talk to her at least 2-3 times a week. [place] Step-father: "Still great." How were you disciplined when you got in trouble as a child/adolescent?: "Grounded." Does patient have siblings?: Yes Number of Siblings: 2 Description of patient's current relationship with siblings: Biological sister 63(31), "pretty good." 1/2 sister: "we don't have a relationship at all." Did patient suffer any verbal/emotional/physical/sexual abuse as a child?: No Did patient suffer from severe childhood neglect?: No Has patient ever been sexually abused/assaulted/raped as an adolescent or adult?: No Was the patient ever a victim of a crime or a disaster?: No Witnessed domestic violence?: No Has patient been effected by domestic violence as an adult?: No  CCA Part Two B  Employment/Work Situation: Employment / Work Psychologist, occupationalituation Employment situation: Employed 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?: No Are There Guns or Other Weapons in Your Home?: No  Education: Engineer, civil (consulting)ducation School Currently Attending: n/a Last Grade Completed: 12 Name of High School: [employer] Did You Graduate From [employer]?: Yes Did Theme park managerYou Attend College?: No Did Designer, television/film setYou Attend Graduate School?: No Did You Have An Individualized Education Program (IIEP): No Did You Have Any Difficulty At [employer] EnergySchool?: No  Religion: Religion/Spirituality Are You A Religious Person?: No How Might This Affect Treatment?: n/a  Leisure/Recreation: Leisure / Recreation Leisure and Hobbies: "When I have time, I like to fish, I have a Technical sales engineerjeep wrangler and like to ride that around."  Exercise/Diet: Exercise/Diet Do You Exercise?: No Have You Gained or Lost A Significant Amount of Weight in the Past Six Months?: No Do You Follow a Special Diet?: No Do You Have Any Trouble Sleeping?: No  CCA Part Two C  Alcohol/Drug Use: Alcohol / Drug Use Pain Medications: SEE MAR Prescriptions: SEE MAR Over the Counter: SEE MAR History of alcohol / drug use?: No history of alcohol / drug abuse                      CCA Part Three  ASAM's:  Six Dimensions of Multidimensional Assessment  Dimension 1:  Acute Intoxication and/or Withdrawal Potential:     Dimension 2:  Biomedical Conditions and  Complications:     Dimension 3:  Emotional, Behavioral, or Cognitive Conditions and Complications:     Dimension 4:  Readiness to Change:     Dimension 5:  Relapse, Continued use, or Continued Problem Potential:     Dimension 6:  Recovery/Living Environment:      Substance use Disorder (SUD)    Social Function:  Social Functioning Social Maturity: Responsible Social Judgement: Normal  Stress:  Stress Stressors: Transitions Coping  Ability: Normal Patient Takes Medications The Way The Doctor Instructed?: Yes Priority Risk: Low Acuity  Risk Assessment- Self-Harm Potential: Risk Assessment For Self-Harm Potential Thoughts of Self-Harm: No current thoughts Method: No plan Availability of Means: No access/NA Additional Comments for Self-Harm Potential: Recently hospitalized for SI.  Risk Assessment -Dangerous to Others Potential: Risk Assessment For Dangerous to Others Potential Method: No Plan Availability of Means: No access or NA Intent: Vague intent or NA Notification Required: No need or identified person Additional Comments for Danger to Others Potential: n/a  DSM5 Diagnoses: Patient Active Problem List   Diagnosis Date Noted  . MDD (major depressive disorder), single episode, mild ([place]) 11/12/2018  . 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 Suicidal ideation 09/25/2018  . 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 Left-sided weakness 05/04/2018  . Attention deficit disorder (ADD) in adult 11/06/2016  . 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 Mixed hyperlipidemia 09/01/2016  . Metabolic syndrome [id number]/[id number]  . GERD (gastroesophageal reflux disease) 09/01/2016  . Essential hypertension 09/01/2016  . Class 1 obesity due to excess calories with serious comorbidity and body mass index (BMI) of 31.0 to 31.9 in adult 09/01/2016  . Cellulitis of left axilla 09/01/2016  . Cellulitis of left lower extremity 09/01/2016  . Anxiety 09/01/2016  . Sciatica, left side 08/27/2016  . Multiple sclerosis ([place]) 07/17/2016  . Gait disturbance 07/17/2016  . Left optic neuritis 07/17/2016  . Numbness 07/17/2016  . Blurred vision, left eye   . TIA (transient ischemic attack) 07/01/2016    Patient Centered Plan: Patient is on the following Treatment Plan(s):  Depression  Recommendations for Services/Supports/Treatments: Recommendations for  Services/Supports/Treatments Recommendations For Services/Supports/Treatments: Individual Therapy, Medication Management  Treatment Plan Summary:    Referrals to Alternative Service(s): Referred to Alternative Service(s):   Place:   Date:   Time:    Referred to Alternative Service(s):   Place:   Date:   Time:    Referred to Alternative Service(s):   Place:   Date:   Time:    Referred to Alternative Service(s):   Place:   Date:   Time:     Alden Hipp, LCSW

## 2018-11-24 ENCOUNTER — Other Ambulatory Visit: Payer: Self-pay | Admitting: Psychiatry

## 2018-12-09 ENCOUNTER — Ambulatory Visit: Payer: BC Managed Care – PPO | Admitting: Licensed Clinical Social Worker

## 2018-12-09 ENCOUNTER — Other Ambulatory Visit: Payer: Self-pay

## 2018-12-16 ENCOUNTER — Telehealth: Payer: Self-pay | Admitting: *Deleted

## 2018-12-16 NOTE — Telephone Encounter (Signed)
Received letter from [facility] that [place] approved 12/14/18-12/13/19. Ref number: [id number]. [id number]. Letter dated 12/11/2018. Gave to [facility] for their records and sent copy to be scanned into epic.

## 2018-12-18 ENCOUNTER — Other Ambulatory Visit: Payer: Self-pay

## 2018-12-18 ENCOUNTER — Ambulatory Visit (INDEPENDENT_AMBULATORY_CARE_PROVIDER_SITE_OTHER): Payer: BC Managed Care – PPO | Admitting: Psychiatry

## 2018-12-18 DIAGNOSIS — Z5329 Procedure and treatment not carried out because of patient's decision for other reasons: Secondary | ICD-10-CM

## 2018-12-18 NOTE — Progress Notes (Signed)
No response to calls 

## 2018-12-29 ENCOUNTER — Other Ambulatory Visit: Payer: Self-pay

## 2018-12-29 ENCOUNTER — Encounter: Payer: Self-pay | Admitting: Neurology

## 2018-12-29 ENCOUNTER — Ambulatory Visit (INDEPENDENT_AMBULATORY_CARE_PROVIDER_SITE_OTHER): Payer: BC Managed Care – PPO | Admitting: Neurology

## 2018-12-29 VITALS — BP 144/92 | HR 93 | Temp 98.2°F | Ht 73.0 in | Wt 289.0 lb

## 2018-12-29 DIAGNOSIS — Z79899 Other long term (current) drug therapy: Secondary | ICD-10-CM

## 2018-12-29 DIAGNOSIS — G35 Multiple sclerosis: Secondary | ICD-10-CM

## 2018-12-29 DIAGNOSIS — R200 Anesthesia of skin: Secondary | ICD-10-CM

## 2018-12-29 DIAGNOSIS — R531 Weakness: Secondary | ICD-10-CM

## 2018-12-29 DIAGNOSIS — R269 Unspecified abnormalities of gait and mobility: Secondary | ICD-10-CM

## 2018-12-29 DIAGNOSIS — H469 Unspecified optic neuritis: Secondary | ICD-10-CM

## 2018-12-29 MED ORDER — OMEPRAZOLE 40 MG PO CPDR: 40.0000 mg | DELAYED_RELEASE_CAPSULE | Freq: Every day | ORAL | 11 refills | Status: DC

## 2018-12-29 MED ORDER — AMANTADINE HCL 100 MG PO CAPS: 100.0000 mg | ORAL_CAPSULE | Freq: Two times a day (BID) | ORAL | 11 refills | Status: DC

## 2018-12-29 NOTE — Progress Notes (Addendum)
[facility]  PATIENT: Sean Espinoza DOB: 07/31/1975  REFERRING DOCTOR OR PCP:  Referred by Dr. Thad Rangereynolds   SOURCE: paitent, ED notes, imaging reports, lab reports, MRI images on PACS  _________________________________   HISTORICAL  CHIEF COMPLAINT:  Chief Complaint  Patient presents with   Multiple Sclerosis    Room 13 with wife- Rosey Batheresa, "Ocrevus infusion on 12/28/18, joints in hands are swelling, some fingers are numb with needle feelings".     HISTORY OF PgRESENT ILLNESS:  Sean Espinoza is a 43 y.o. man  diagnosed with relapsing remitting multiple sclerosis  in early 2018.    Update 12/29/2018: He is on Ocrevus with first dose in April. He felt bad for 2 days after the infusion (achy and tired).    He had an exacerbation in February while on Tysabri.      He feels his MS has been fairly stable.    He does note the first three fingers if the left hand and 2nd and 3rd fingers of the right hand will go numb.   His joints sometimes feel swollen.  These symptoms come and go and he notices it more if he is using his hands more.  It does not wake him up at night.     He feels his legs are doing better and he is walking better.   He can walk 2 miles without stopping now.   He has no more falls.      Update 08/26/2018 Virtual Visit He started Ocrevus in April and had no side effects.  He denies any new MS symptom but is still very weak on his left side.   He has more pain on the right form overuse.  He has weakness in the left leg > arm and also has numbness arm > leg.   He drops items on the left.   He has difficulty walking and needs to hold on for balance.   Walking without support is slow and tenuous. He can walk 500-600 feet but balance is worse with longer distance.    He fell x 3 at work the past 2 weeks.   He feels strength never really improved much after the exacerbation in February.     Numbness is also the same.   He has trouble standing up if he falls.    He is trying to get by without a cane.   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  He has to pull pallets.   He was needing to go up and down ladders but no longer is doing so.   He notes a swollen sensation OS.     He notes that he sometimes has some dribbling with his urine though no frank incontinence.  He has mild fuzziness with his left vision.   He notes reduced hearing OS.       His left hand is sometimes swollen.     Fatigue seems worse.   He is on Adderall 15 mg po bid and it seemed to help more initially.      1.  Continue Ocrevus.  His next infusion will be in about 4 months. 2.  He has back at work.  Due to his impairments, we need to extend the restrictions (15 pound lifting and no ladders). 3.  Increase Adderall to 15 mg 3 times a day. 4.  Trial of Ampyra to see if gait speed and balance improve.  Kidney function was fine on the  February labs.    Update 05/04/2018: He is on Tysabri and has tolerated it well.   The last JCV Ab was negative 10/2017.     He has noted more issues with gait x 2 weeks.   He woke up 3 days ago with left leg weakness that has persisted.  He fell getting out of bed on Saturday.    His balance is off since Saturday.   He notes numbness/tingling x 6 weeks.   Also vision is worse out of the last eye.    Fatigue is also a lot worse the past 6 weeks.    He is sleeping well but only gives himself about 6 hours of bedtime each day.     He is not noting new bladder issues.   Oxybutynin helped the urinary frequency/urgency.     He stands at work and also has to [employer].   He has been working up to 60-80 hours most weeks the past year and has more stress,   He was unable to work today.      Update 02/25/2018: He is on Tysabri since he had an exacerbation 04/2017.   He is tolerating it well.    JCV Ab ws as negative   He feels his gait is more off balanced and he needs to catch himself more.   Strength is similar.  He gets a tingling  sensation in his feet, left > right.    He notes the tingling more now than in the past.   He notes welling in his hands and they seem to freeze up if he is not constantly moving them .    He feels bladder frequency has increased and he has nocturia at least 4 times a night.    He has never been on medication.   He has no hesitancy.     He has reduced OS vision since diagnosis.     He notes some reduced hearing.      He feels his mood has been up and down a lot this year..   Duloxetine has not helped.   He has decreased focus/attention and fatigue.    Adderall seemed to help at first but less so now.    He no longer sees a difference when he takes it compared to a day when he does not.    He feels he gets 5-8 hours of sleep.      Update 04/02/2017:  He began to experience left-sided arm and leg weakness and dysesthesias in his feet and blurry vision in the left eye about one week ago.  Of note, his initial exacerbation with MS left optic neuritis. Vision improved but never to 20/20. Today, he is 20/200 in the left eye.  He had had some difficulty with left sided numbness but feels his current symptoms are significantly worse.   He continues to take the Tecfidera although he has excessive flushing. At the last visit we had discussed other options for treatment.   He was leaning towards Tysabri or Gilenya. We discussed both of these options in more detail. The JCV antibody is negative. Therefore, my recommendation for him is to switch to Tysabri.   Update 03/21/2017:  He is on Tecfidera 240 mg twice a day. He is experiencing flushing with almost every dose a couple hours after he takes it. This can be severe at times. He is taking the medication with food and has tried aspirin without much success.   His MS  is mostly stable and there has been no exacerbation.   He has noted some more tinnitus in the left ear and the left eye seems to be more blurry.    Left ON was his presenting symptom and he feesl he is doing  much worse out of that eye.   Right eye vision is fine.   Colors are desaturated.   Fatigue is more of a problem.  Adderall innitially helped but less so now.     Gait is ok..   The left leg goes numb easily.  He also notes mild left leg weakness.    He is more irritable and gets aggravated.   HE takes Zoloft for mild depression.   He has some decreased focus and attention, helped by the Adderall.  From 11/05/2016: MS:    He is tolerating Tecfidera ok.  No mor eflushing    He denies any significant GI issues.   He has GERD and that is unchanged.    No new exacerbation  Cognition:    A couple weeks ago, while on a teleconference, he was having a lot of difficulties getting the right words out. He felt he was speaking gibberish.   He was getting words out but they were jumbled and out of order.  After a few minutes the call was stopped and he feels this went on x 10 minutes.  He notes losing his train of thought in conversation.   MMSE today is 29/30.    He understood everything and he thought he was talking normally but his supervisor noted the changes.    He has noted decreased focus and attention.  He has had this for a while but it seems worse,   He notes missing turns and driving past where he was supposed to go a few times.     Left Sciatica:   He is noting continued lower back and leg pain.  Pain is worse first thing in the morning and is better after he's walked some. Pain started May 2018.   He notes some numbness and tingling in the left leg.   He feels weight bearing is worse on that leg.   He has no h/o lumbar spine issues.   He only notes mild buttock pain when sitting.   However, pain goes down the left leg when sitting on the left cheek.    A TPI at the last visit only helped x 1 day only.    MS:   He is tolerating Tecfidera ok.  He has had some flushing.    He denies any significant GI issues.   He has GERD and that is unchanged.    No new exacerbation  Gait/strength/sensation   He reports  that his gait is doing well. He he denies any stumbling or falls. He notes no major difficulty with strength or sensation in the arms or legs.   The numbness that he was having on the left side has resolved.  Bladder: He denies any significant problem with bladder urgency or frequency or hesitancy. He does not have any incontinence.  Vision: He denies any difficulty with visual acuity, diplopia or eye pain.  Fatigue/sleep:  He notes mild fatigue, physical more than cognitive. Fatigue is worse with heat.[name]  He is sleeping worse since the leg pain started.   .  Mood:    He notes more irritability but no change in depression.   He is on Zoloft.  X 6 years, more for anxiety  than depression.    MS History:   In mid March, he had the onset of blurry vision out of the left eye. He noted that the visual field was worse on the left side than the right side. Additionally he was unable to read out of the left eye. Colors were mildly desaturated. On April 2, he had the onset of left facial numbness. Over the next day the numbness increased to involve the entire left side, worse in the arm than the leg. He also noted mild weakness in the left arm and reduced balance and gait.[name] He presented to [facility] and an MRI 07/02/2016 showed many T2/FLAIR hyperintense foci, four foci  enhanced  (right frontal subcortical white matter, right corpus callosum, left parietal subcortical white matter and left mesial temporal lobe), consistent with the diagnosis of MS. He was admitted and received 3 days of IV Solu-Medrol. His symptoms improved some and he was discharged.  He was referred to me and Melchor Amour was started in March 2018.     REVIEW OF SYSTEMS: Constitutional: No fevers, chills, sweats, or change in appetite.   Notes fatigue. Eyes: No visual changes, double vision, eye pain Ear, nose and throat: No hearing loss, ear pain, nasal congestion, sore throat Cardiovascular: No chest pain, palpitations Respiratory: No  shortness of breath at rest or with exertion.   No wheezes GastrointestinaI: No nausea, vomiting, diarrhea, abdominal pain, fecal incontinence Genitourinary: No dysuria, urinary retention or frequency.  No nocturia. Musculoskeletal: No neck pain, back pain Integumentary: No rash, pruritus, skin lesions Neurological: as above Psychiatric: No depression at this time.  Mild anxiety Endocrine: No palpitations, diaphoresis, change in appetite, change in weigh or increased thirst Hematologic/Lymphatic: No anemia, purpura, petechiae. Allergic/Immunologic: No itchy/runny eyes, nasal congestion, recent allergic reactions, rashes  ALLERGIES: No Known Allergies  HOME MEDICATIONS:  Current Outpatient Medications:    amphetamine-dextroamphetamine (ADDERALL) 15 MG tablet, Take one po three times a day, Disp: 90 tablet, Rfl: 0   citalopram (CELEXA) 20 MG tablet, Take 1 tablet (20 mg total) by mouth daily., Disp: 30 tablet, Rfl: 1   metoprolol tartrate (LOPRESSOR) 25 MG tablet, Take 1 tablet (25 mg total) by mouth 2 (two) times daily., Disp: 60 tablet, Rfl: 1   ocrelizumab (OCREVUS) 300 MG/10ML injection, Inject into the vein once., Disp: , Rfl:    omeprazole (PRILOSEC) 40 MG capsule, Take 1 capsule (40 mg total) by mouth daily., Disp: 30 capsule, Rfl: 11   traZODone (DESYREL) 50 MG tablet, Take 1 tablet (50 mg total) by mouth at bedtime as needed for sleep., Disp: 30 tablet, Rfl: 1   amantadine (SYMMETREL) 100 MG capsule, Take 1 capsule (100 mg total) by mouth 2 (two) times daily., Disp: 60 capsule, Rfl: 11 No current facility-administered medications for this visit.   Facility-Administered Medications Ordered in Other Visits:    gadopentetate dimeglumine (MAGNEVIST) injection 20 mL, 20 mL, Intravenous, Once PRN, Mykael Batz, Nanine Means, MD  PAST MEDICAL HISTORY: Past Medical History:  Diagnosis Date   Atrial fibrillation ([place])    Hypertension    MS (multiple sclerosis) ([place])    Vision  abnormalities     PAST SURGICAL HISTORY: Past Surgical History:  Procedure Laterality Date   CERVICAL FUSION  2005, 2014    FAMILY HISTORY: Family History  Problem Relation Age of Onset   Lupus Mother    Healthy Father    Lymphoma Maternal Grandfather    Colon cancer Paternal Grandmother    Alcohol abuse Paternal Uncle  Alcohol abuse Paternal Grandfather    Kidney cancer Neg Hx    Bladder Cancer Neg Hx    Prostate cancer Neg Hx     SOCIAL HISTORY:  Social History   Socioeconomic History   Marital status: Married    Spouse name: teresa   Number of children: 3   Years of education: Not on file   Highest education level: High school graduate  Occupational History   Not on file  Social Needs   Financial resource strain: Not hard at all   Food insecurity    Worry: Never true    Inability: Never true   Transportation needs    Medical: No    Non-medical: No  Tobacco Use   Smoking status: Former Smoker    Quit date: 2003    Years since quitting: 17.7   Smokeless tobacco: Never Used  Substance and Sexual Activity   Alcohol use: Yes    Comment: social   Drug use: No   Sexual activity: Yes  Lifestyle   Physical activity    Days per week: 4 days    Minutes per session: 60 min   Stress: Only a little  Relationships   Event organiser on phone: Not on file    Gets together: Not on file    Attends religious service: Never    Active member of club or organization: No    Attends meetings of clubs or organizations: Never    Relationship status: Married   Intimate partner violence    Fear of current or ex partner: No    Emotionally abused: No    Physically abused: No    Forced sexual activity: No  Other Topics Concern   Not on file  Social History Narrative   Not on file     PHYSICAL EXAM  Vitals:   12/29/18 1049  BP: (!) 144/92  Pulse: 93  Temp: 98.2 F (36.8 C)  Weight: 289 lb (131.1 kg)  Height: 6\' 1"   (1.854 m)    Body mass index is 38.13 kg/m.   General: The patient is well-developed and well-nourished and in no acute distress  Vision:   20/200 OS and 20/30 OD   Neurologic Exam  Mental status:    The patient is alert and oriented x 3 at the time of the examination. The patient has apparent normal recent and remote memory, with an apparently normal attention span and concentration ability.   Speech is normal.  Cranial nerves: Extraocular movements are full.      Colors are desaturated OS and he reduced ability to read across the room.    Facial strength is normal.  Trapezius and sternocleidomastoid strength is normal. No dysarthria is noted.  The tongue is midline, and the patient has symmetric elevation of the soft palate. No obvious hearing deficits are noted.  Motor:  Muscle bulk is normal.   Tone is normal. Strength is now normal   Sensory: Sensory testing is intact on the right.  He has reduced touch, temperature and vibration sensation in the left arm and leg..   Coordination: Cerebellar testing shows slightly reduced left nose finger and very reduced left heel-to-shin.  Gait and station: Station is normal.   He does not now have a left foot drop and tandem gait is wide.  The Romberg was positive.   Reflexes: Deep tendon reflexes are symmetric and increased at the knees with spread. No ankle clonus.  DIAGNOSTIC DATA (LABS, IMAGING, TESTING) - I reviewed patient records, labs, notes, testing and imaging myself where available.  Lab Results  Component Value Date   WBC 7.2 09/24/2018   HGB 16.0 09/24/2018   HCT 47.7 09/24/2018   MCV 84.6 09/24/2018   PLT 278 09/24/2018      Component Value Date/Time   NA 140 09/24/2018 0428   NA 142 05/04/2018 1514   K 3.8 09/24/2018 0428   CL 104 09/24/2018 0428   CO2 26 09/24/2018 0428   GLUCOSE 125 (H) 09/24/2018 0428   BUN 11 09/24/2018 0428   BUN 10 05/04/2018 1514   CREATININE 1.26 (H) 09/24/2018 0428   CALCIUM 9.7  09/24/2018 0428   PROT 7.9 09/24/2018 0428   PROT 6.4 05/04/2018 1514   ALBUMIN 4.8 09/24/2018 0428   ALBUMIN 4.8 05/04/2018 1514   AST 13 (L) 09/24/2018 0428   ALT 15 09/24/2018 0428   ALKPHOS 54 09/24/2018 0428   BILITOT 0.7 09/24/2018 0428   BILITOT 0.4 05/04/2018 1514   GFRNONAA >60 09/24/2018 0428   GFRAA >60 09/24/2018 0428   _________________________  Multiple sclerosis (HCC) - Plan: CBC with Differential/Platelet, Comprehensive metabolic panel, IgG, IgA, IgM  High risk medication use - Plan: CBC with Differential/Platelet, Comprehensive metabolic panel, IgG, IgA, IgM  Numbness  Left-sided weakness  Left optic neuritis  Gait disturbance   1.   He will continue Ocrevus for MS.  Hopefully he will tolerate the second infusion better than the very first dose.  We will check some lab work today..    2.   Amantadine for fatigue. 3.   Stay active and exercise as tolerated. 4.   He will return to see us in 6 months or sooner if there are new or worsening neurologic symptoms.

## 2018-12-30 ENCOUNTER — Telehealth: Payer: Self-pay | Admitting: *Deleted

## 2018-12-30 LAB — COMPREHENSIVE METABOLIC PANEL
ALT: 17 IU/L (ref 0–44)
AST: 15 IU/L (ref 0–40)
Albumin/Globulin Ratio: 3 — ABNORMAL HIGH (ref 1.2–2.2)
Albumin: 4.8 g/dL (ref 4.0–5.0)
Alkaline Phosphatase: 60 IU/L (ref 39–117)
BUN/Creatinine Ratio: 10 (ref 9–20)
BUN: 9 mg/dL (ref 6–24)
Bilirubin Total: 0.3 mg/dL (ref 0.0–1.2)
CO2: 24 mmol/L (ref 20–29)
Calcium: 9.9 mg/dL (ref 8.7–10.2)
Chloride: 105 mmol/L (ref 96–106)
Creatinine, Ser: 0.92 mg/dL (ref 0.76–1.27)
GFR calc Af Amer: 119 mL/min/{1.73_m2} (ref >59)
GFR calc non Af Amer: 103 mL/min/{1.73_m2} (ref >59)
Globulin, Total: 1.6 g/dL (ref 1.5–4.5)
Glucose: 143 mg/dL — ABNORMAL HIGH (ref 65–99)
Potassium: 4 mmol/L (ref 3.5–5.2)
Sodium: 145 mmol/L — ABNORMAL HIGH (ref 134–144)
Total Protein: 6.4 g/dL (ref 6.0–8.5)

## 2018-12-30 LAB — CBC WITH DIFFERENTIAL/PLATELET
Basophils Absolute: 0 10*3/uL (ref 0.0–0.2)
Basos: 0 %
EOS (ABSOLUTE): 0 10*3/uL (ref 0.0–0.4)
Eos: 0 %
Hematocrit: 44.9 % (ref 37.5–51.0)
Hemoglobin: 15.1 g/dL (ref 13.0–17.7)
Immature Grans (Abs): 0.1 10*3/uL (ref 0.0–0.1)
Immature Granulocytes: 1 %
Lymphocytes Absolute: 0.6 10*3/uL — ABNORMAL LOW (ref 0.7–3.1)
Lymphs: 6 %
MCH: 29.7 pg (ref 26.6–33.0)
MCHC: 33.6 g/dL (ref 31.5–35.7)
MCV: 88 fL (ref 79–97)
Monocytes Absolute: 0.6 10*3/uL (ref 0.1–0.9)
Monocytes: 5 %
Neutrophils Absolute: 9.8 10*3/uL — ABNORMAL HIGH (ref 1.4–7.0)
Neutrophils: 88 %
Platelets: 228 10*3/uL (ref 150–450)
RBC: 5.09 x10E6/uL (ref 4.14–5.80)
RDW: 14.1 % (ref 11.6–15.4)
WBC: 11.1 10*3/uL — ABNORMAL HIGH (ref 3.4–10.8)

## 2018-12-30 LAB — IGG, IGA, IGM
IgA/Immunoglobulin A, Serum: 123 mg/dL (ref 90–386)
IgG (Immunoglobin G), Serum: 593 mg/dL — ABNORMAL LOW (ref 603–1613)
IgM (Immunoglobulin M), Srm: 33 mg/dL (ref 20–172)

## 2018-12-30 NOTE — Telephone Encounter (Signed)
-----   Message from Britt Bottom, MD sent at 12/30/2018 11:41 AM EDT ----- Please let the patient know that the lab work is fine.   IgG is borderline low but not enough to worry about

## 2019-01-09 ENCOUNTER — Other Ambulatory Visit: Payer: Self-pay | Admitting: Psychiatry

## 2019-01-12 ENCOUNTER — Other Ambulatory Visit: Payer: Self-pay | Admitting: *Deleted

## 2019-01-12 MED ORDER — AMPHETAMINE-DEXTROAMPHETAMINE 15 MG PO TABS: ORAL_TABLET | ORAL | 0 refills | Status: DC

## 2019-01-14 ENCOUNTER — Other Ambulatory Visit: Payer: Self-pay | Admitting: Psychiatry

## 2019-01-14 DIAGNOSIS — F320 Major depressive disorder, single episode, mild: Secondary | ICD-10-CM

## 2019-01-14 DIAGNOSIS — F5105 Insomnia due to other mental disorder: Secondary | ICD-10-CM

## 2019-03-11 ENCOUNTER — Other Ambulatory Visit: Payer: Self-pay | Admitting: Neurology

## 2019-03-11 MED ORDER — AMPHETAMINE-DEXTROAMPHETAMINE 15 MG PO TABS: ORAL_TABLET | ORAL | 0 refills | Status: DC

## 2019-03-11 NOTE — Telephone Encounter (Signed)
Pt is requesting a refill of amphetamine-dextroamphetamine (ADDERALL) 15 MG tablet , to be sent to [facility], [place]

## 2019-03-25 ENCOUNTER — Other Ambulatory Visit: Payer: Self-pay | Admitting: Psychiatry

## 2019-03-25 DIAGNOSIS — F320 Major depressive disorder, single episode, mild: Secondary | ICD-10-CM

## 2019-03-25 DIAGNOSIS — F5105 Insomnia due to other mental disorder: Secondary | ICD-10-CM

## 2019-05-21 ENCOUNTER — Other Ambulatory Visit: Payer: Self-pay | Admitting: Neurology

## 2019-05-21 NOTE — Telephone Encounter (Signed)
1) Medication(s) Requested (by name): adderall  2) Pharmacy of Choice: [facility] in [place] 3) Special Requests:

## 2019-05-24 ENCOUNTER — Other Ambulatory Visit: Payer: Self-pay | Admitting: Psychiatry

## 2019-05-24 MED ORDER — AMPHETAMINE-DEXTROAMPHETAMINE 15 MG PO TABS: ORAL_TABLET | ORAL | 0 refills | Status: DC

## 2019-06-30 ENCOUNTER — Ambulatory Visit (INDEPENDENT_AMBULATORY_CARE_PROVIDER_SITE_OTHER): Payer: BC Managed Care – PPO | Admitting: Neurology

## 2019-06-30 ENCOUNTER — Other Ambulatory Visit: Payer: Self-pay

## 2019-06-30 ENCOUNTER — Encounter: Payer: Self-pay | Admitting: Neurology

## 2019-06-30 VITALS — BP 147/103 | HR 64 | Temp 97.8°F | Ht 73.0 in | Wt 294.0 lb

## 2019-06-30 DIAGNOSIS — F988 Other specified behavioral and emotional disorders with onset usually occurring in childhood and adolescence: Secondary | ICD-10-CM

## 2019-06-30 DIAGNOSIS — G4733 Obstructive sleep apnea (adult) (pediatric): Secondary | ICD-10-CM

## 2019-06-30 DIAGNOSIS — Z79899 Other long term (current) drug therapy: Secondary | ICD-10-CM

## 2019-06-30 DIAGNOSIS — G4719 Other hypersomnia: Secondary | ICD-10-CM

## 2019-06-30 DIAGNOSIS — R200 Anesthesia of skin: Secondary | ICD-10-CM | POA: Diagnosis not present

## 2019-06-30 DIAGNOSIS — G35 Multiple sclerosis: Secondary | ICD-10-CM

## 2019-06-30 DIAGNOSIS — G5601 Carpal tunnel syndrome, right upper limb: Secondary | ICD-10-CM

## 2019-06-30 DIAGNOSIS — R202 Paresthesia of skin: Secondary | ICD-10-CM

## 2019-06-30 MED ORDER — AMPHETAMINE-DEXTROAMPHETAMINE 15 MG PO TABS: ORAL_TABLET | ORAL | 0 refills | Status: DC

## 2019-06-30 MED ORDER — BUPROPION HCL ER (XL) 150 MG PO TB24: 150.0000 mg | ORAL_TABLET | Freq: Every day | ORAL | 3 refills | Status: DC

## 2019-06-30 NOTE — Progress Notes (Signed)
[facility]  PATIENT: Sean Espinoza DOB: 1975/11/30  REFERRING DOCTOR OR PCP:  Referred by Dr. Thad Ranger   SOURCE: paitent, ED notes, imaging reports, lab reports, MRI images on PACS  _________________________________   HISTORICAL  CHIEF COMPLAINT:  Chief Complaint  Patient presents with  . Follow-up    RM 13, alone. Last seen 12/29/18.   . Multiple Sclerosis    On Ocrevus. Last 12/28/18. Next: 07/14/19 at 9am.Receives here with intrafusion. Reports intermittent ringing in ears the last three months. Also having arm pain, bilaterally. It is worse in his right arm. More difficult to grip larger cups (ex-yeti). Having intermittent blurry vision in left eye.   . Fatigue    Takes amantidine, adderall. Does not feel fatigue has improved. Wants to discuss adding wellbutrin in the morning    HISTORY OF PgRESENT ILLNESS:  Sean Espinoza is a 44 y.o. man  diagnosed with relapsing remitting multiple sclerosis  in early 2018.    Update 06/30/2019: He is on Ocrevus with first dose in March/ April 2020 and has his next one in a coupe weeks. .   He had an exacerbation in February while on Tysabri.      He feels his MS has been fairly stable.  He feels his legs are doing better- back to normal and he can walk unrestricted.     He notes ringing in his ears off/on.   He notes pain in the right arm, especially if he grips an item.   He feels strength is fine.   He gets some numbness in digits 3/4 and sometimes 2.  Sometimes the numbness wakes him up.   He has had C5-C7 ACDF and has mild DJD elsewhere but no nerve root compression or spinal stenosis.    He has had fatigue.  On Adderall, he does a little better.     He snores and his wife notes gasps in breathing.  He has gained about 20 pounds.   He wears an 18 1/2 inch collar  EPWORTH SLEEPINESS SCALE  On a scale of 0 - 3 what is the chance of dozing:  Sitting and Reading:     0 Watching TV:    3 Sitting inactive in a  public place: 0 Passenger in car for one hour: 3 Lying down to rest in the afternoon: 3 Sitting and talking to someone: 0 Sitting quietly after lunch:  3 In a car, stopped in traffic:  0  Total (out of 24):   12/24   Mild EDS   Update 12/29/2018: He is on Ocrevus with first dose in April. He felt bad for 2 days after the infusion (achy and tired).    He had an exacerbation in February while on Tysabri.      He feels his MS has been fairly stable.    He does note the first three fingers if the left hand and 2nd and 3rd fingers of the right hand will go numb.   His joints sometimes feel swollen.  These symptoms come and go and he notices it more if he is using his hands more.  It does not wake him up at night.     He feels his legs are doing better and he is walking better.   He can walk 2 miles without stopping now.   He has no more falls.      Update 08/26/2018 Virtual Visit He started Ocrevus in April and had no side effects.  He  denies any new MS symptom but is still very weak on his left side.   He has more pain on the right form overuse.  He has weakness in the left leg > arm and also has numbness arm > leg.   He drops items on the left.   He has difficulty walking and needs to hold on for balance.   Walking without support is slow and tenuous. He can walk 500-600 feet but balance is worse with longer distance.    He fell x 3 at work the past 2 weeks.   He feels strength never really improved much after the exacerbation in February.     Numbness is also the same.   He has trouble standing up if he falls.   He is trying to get by without a cane.   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   He was needing to go up and down ladders but no longer is doing so.   He notes a swollen sensation OS.     He notes that he sometimes has some dribbling with his urine though no frank incontinence.  He has mild fuzziness with his left vision.   He notes reduced  hearing OS.       His left hand is sometimes swollen.     Fatigue seems worse.   He is on Adderall 15 mg po bid and it seemed to help more initially.      1.  Continue Ocrevus.  His next infusion will be in about 4 months. 2.  He has back at work.  Due to his impairments, we need to extend the restrictions (15 pound lifting and no ladders). 3.  Increase Adderall to 15 mg 3 times a day. 4.  Trial of Ampyra to see if gait speed and balance improve.  Kidney function was fine on the February labs.    Update 05/04/2018: He is on Tysabri and has tolerated it well.   The last JCV Ab was negative 10/2017.     He has noted more issues with gait x 2 weeks.   He woke up 3 days ago with left leg weakness that has persisted.  He fell getting out of bed on Saturday.    His balance is off since Saturday.   He notes numbness/tingling x 6 weeks.   Also vision is worse out of the last eye.    Fatigue is also a lot worse the past 6 weeks.    He is sleeping well but only gives himself about 6 hours of bedtime each day.     He is not noting new bladder issues.   Oxybutynin helped the urinary frequency/urgency.     He stands at work and also has to [employer].   He has been working up to 60-80 hours most weeks the past year and has more stress,   He was unable to work today.      Update 02/25/2018: He is on Tysabri since he had an exacerbation 04/2017.   He is tolerating it well.    JCV Ab ws as negative   He feels his gait is more off balanced and he needs to catch himself more.   Strength is similar.  He gets a tingling sensation in his feet, left > right.    He notes the tingling more now than in the past.   He notes welling in his hands and they  seem to freeze up if he is not constantly moving them .    He feels bladder frequency has increased and he has nocturia at least 4 times a night.    He has never been on medication.   He has no hesitancy.     He has reduced OS vision since diagnosis.     He notes  some reduced hearing.      He feels his mood has been up and down a lot this year..   Duloxetine has not helped.   He has decreased focus/attention and fatigue.    Adderall seemed to help at first but less so now.    He no longer sees a difference when he takes it compared to a day when he does not.    He feels he gets 5-8 hours of sleep.      Update 04/02/2017:  He began to experience left-sided arm and leg weakness and dysesthesias in his feet and blurry vision in the left eye about one week ago.  Of note, his initial exacerbation with MS left optic neuritis. Vision improved but never to 20/20. Today, he is 20/200 in the left eye.  He had had some difficulty with left sided numbness but feels his current symptoms are significantly worse.   He continues to take the Tecfidera although he has excessive flushing. At the last visit we had discussed other options for treatment.   He was leaning towards Tysabri or Gilenya. We discussed both of these options in more detail. The JCV antibody is negative. Therefore, my recommendation for him is to switch to Tysabri.   Update 03/21/2017:  He is on Tecfidera 240 mg twice a day. He is experiencing flushing with almost every dose a couple hours after he takes it. This can be severe at times. He is taking the medication with food and has tried aspirin without much success.   His MS is mostly stable and there has been no exacerbation.   He has noted some more tinnitus in the left ear and the left eye seems to be more blurry.    Left ON was his presenting symptom and he feesl he is doing much worse out of that eye.   Right eye vision is fine.   Colors are desaturated.   Fatigue is more of a problem.  Adderall innitially helped but less so now.     Gait is ok..   The left leg goes numb easily.  He also notes mild left leg weakness.    He is more irritable and gets aggravated.   HE takes Zoloft for mild depression.   He has some decreased focus and attention, helped by the  Adderall.  From 11/05/2016: MS:    He is tolerating Tecfidera ok.  No mor eflushing    He denies any significant GI issues.   He has GERD and that is unchanged.    No new exacerbation  Cognition:    A couple weeks ago, while on a teleconference, he was having a lot of difficulties getting the right words out. He felt he was speaking gibberish.   He was getting words out but they were jumbled and out of order.  After a few minutes the call was stopped and he feels this went on x 10 minutes.  He notes losing his train of thought in conversation.   MMSE today is 29/30.    He understood everything and he thought he was talking normally but his supervisor  noted the changes.    He has noted decreased focus and attention.  He has had this for a while but it seems worse,   He notes missing turns and driving past where he was supposed to go a few times.     Left Sciatica:   He is noting continued lower back and leg pain.  Pain is worse first thing in the morning and is better after he's walked some. Pain started May 2018.   He notes some numbness and tingling in the left leg.   He feels weight bearing is worse on that leg.   He has no h/o lumbar spine issues.   He only notes mild buttock pain when sitting.   However, pain goes down the left leg when sitting on the left cheek.    A TPI at the last visit only helped x 1 day only.    MS:   He is tolerating Tecfidera ok.  He has had some flushing.    He denies any significant GI issues.   He has GERD and that is unchanged.    No new exacerbation  Gait/strength/sensation   He reports that his gait is doing well. He he denies any stumbling or falls. He notes no major difficulty with strength or sensation in the arms or legs.   The numbness that he was having on the left side has resolved.  Bladder: He denies any significant problem with bladder urgency or frequency or hesitancy. He does not have any incontinence.  Vision: He denies any difficulty with visual acuity,  diplopia or eye pain.  Fatigue/sleep:  He notes mild fatigue, physical more than cognitive. Fatigue is worse with heat.[name]  He is sleeping worse since the leg pain started.   .  Mood:    He notes more irritability but no change in depression.   He is on Zoloft.  X 6 years, more for anxiety than depression.    MS History:   In mid March, he had the onset of blurry vision out of the left eye. He noted that the visual field was worse on the left side than the right side. Additionally he was unable to read out of the left eye. Colors were mildly desaturated. On April 2, he had the onset of left facial numbness. Over the next day the numbness increased to involve the entire left side, worse in the arm than the leg. He also noted mild weakness in the left arm and reduced balance and gait.[name] He presented to [facility] and an MRI 07/02/2016 showed many T2/FLAIR hyperintense foci, four foci  enhanced  (right frontal subcortical white matter, right corpus callosum, left parietal subcortical white matter and left mesial temporal lobe), consistent with the diagnosis of MS. He was admitted and received 3 days of IV Solu-Medrol. His symptoms improved some and he was discharged.  He was referred to me and Gilford Raid was started in March 2018.     REVIEW OF SYSTEMS: Constitutional: No fevers, chills, sweats, or change in appetite.   Notes fatigue. Eyes: No visual changes, double vision, eye pain Ear, nose and throat: No hearing loss, ear pain, nasal congestion, sore throat Cardiovascular: No chest pain, palpitations Respiratory: No shortness of breath at rest or with exertion.   No wheezes GastrointestinaI: No nausea, vomiting, diarrhea, abdominal pain, fecal incontinence Genitourinary: No dysuria, urinary retention or frequency.  No nocturia. Musculoskeletal: No neck pain, back pain Integumentary: No rash, pruritus, skin lesions Neurological: as above Psychiatric: No  depression at this time.  Mild  anxiety Endocrine: No palpitations, diaphoresis, change in appetite, change in weigh or increased thirst Hematologic/Lymphatic: No anemia, purpura, petechiae. Allergic/Immunologic: No itchy/runny eyes, nasal congestion, recent allergic reactions, rashes  ALLERGIES: No Known Allergies  HOME MEDICATIONS:  Current Outpatient Medications:  .  amantadine (SYMMETREL) 100 MG capsule, Take 1 capsule (100 mg total) by mouth 2 (two) times daily., Disp: 60 capsule, Rfl: 11 .  amphetamine-dextroamphetamine (ADDERALL) 15 MG tablet, Take one po three times a day, Disp: 90 tablet, Rfl: 0 .  metoprolol tartrate (LOPRESSOR) 25 MG tablet, TAKE 1 TABLET BY MOUTH TWICE DAILY, Disp: 60 tablet, Rfl: 1 .  ocrelizumab (OCREVUS) 300 MG/10ML injection, Inject into the vein once., Disp: , Rfl:  .  omeprazole (PRILOSEC) 40 MG capsule, Take 1 capsule (40 mg total) by mouth daily., Disp: 30 capsule, Rfl: 11 .  traZODone (DESYREL) 50 MG tablet, TAKE 1 TABLET(50 MG) BY MOUTH AT BEDTIME AS NEEDED FOR SLEEP, Disp: 30 tablet, Rfl: 1 .  buPROPion (WELLBUTRIN XL) 150 MG 24 hr tablet, Take 1 tablet (150 mg total) by mouth daily., Disp: 90 tablet, Rfl: 3 No current facility-administered medications for this visit.  Facility-Administered Medications Ordered in Other Visits:  .  gadopentetate dimeglumine (MAGNEVIST) injection 20 mL, 20 mL, Intravenous, Once PRN, Janaki Exley, Pearletha Furl, MD  PAST MEDICAL HISTORY: Past Medical History:  Diagnosis Date  . Atrial fibrillation (HCC)   . Hypertension   . MS (multiple sclerosis) (HCC)   . Vision abnormalities     PAST SURGICAL HISTORY: Past Surgical History:  Procedure Laterality Date  . CERVICAL FUSION  2005, 2014    FAMILY HISTORY: Family History  Problem Relation Age of Onset  . Lupus Mother   . Healthy Father   . Lymphoma Maternal Grandfather   . Colon cancer Paternal Grandmother   . Alcohol abuse Paternal Uncle   . Alcohol abuse Paternal Grandfather   . Kidney cancer  Neg Hx   . Bladder Cancer Neg Hx   . Prostate cancer Neg Hx     SOCIAL HISTORY:  Social History   Socioeconomic History  . Marital status: Married    Spouse name: teresa  . Number of children: 3  . Years of education: Not on file  . Highest education level: High school graduate  Occupational History  . Not on file  Tobacco Use  . Smoking status: Former Smoker    Quit date: 2003    Years since quitting: 18.2  . Smokeless tobacco: Never Used  Substance and Sexual Activity  . Alcohol use: Yes    Comment: social  . Drug use: No  . Sexual activity: Yes  Other Topics Concern  . Not on file  Social History Narrative  . Not on file   Social Determinants of Health   Financial Resource Strain: Low Risk   . Difficulty of Paying Living Expenses: Not hard at all  Food Insecurity: No Food Insecurity  . Worried About Programme researcher, broadcasting/film/video in the Last Year: Never true  . Ran Out of Food in the Last Year: Never true  Transportation Needs: No Transportation Needs  . Lack of Transportation (Medical): No  . Lack of Transportation (Non-Medical): No  Physical Activity: Sufficiently Active  . Days of Exercise per Week: 4 days  . Minutes of Exercise per Session: 60 min  Stress: No Stress Concern Present  . Feeling of Stress : Only a little  Social Connections: Unknown  . Frequency of  Communication with Friends and Family: Not on file  . Frequency of Social Gatherings with Friends and Family: Not on file  . Attends Religious Services: Never  . Active Member of Clubs or Organizations: No  . Attends Banker Meetings: Never  . Marital Status: Married  Catering manager Violence: Not At Risk  . Fear of Current or Ex-Partner: No  . Emotionally Abused: No  . Physically Abused: No  . Sexually Abused: No     PHYSICAL EXAM  Vitals:   06/30/19 1533  BP: (!) 147/103  Pulse: 64  Temp: 97.8 F (36.6 C)  Weight: 294 lb (133.4 kg)  Height:  (1.854 m)    Body mass  index is 38.79 kg/m.   General: The patient is well-developed and well-nourished and in no acute distress.  Pharynx is Mallampati 3.   Neurologic Exam  Mental status:    The patient is alert and oriented x 3 at the time of the examination. The patient has apparent normal recent and remote memory, with an apparently normal attention span and concentration ability.   Speech is normal.  Cranial nerves: Extraocular movements are full.      Facial strength is normal.  Trapezius and sternocleidomastoid strength is normal. No dysarthria is noted.  The tongue is midline, and the patient has symmetric elevation of the soft palate. No obvious hearing deficits are noted.  Motor:  Muscle bulk is normal.   Tone is normal. Strength is normal except 4+/5 for the right APB muscle and the hand.  Left APB and bilateral ulnar innervated hand muscles were normal  Sensory: Sensory testing is altered in the palm adjacent to the second third and fourth fingers and in the dorsum in a similar distribution..  He has reduced touch, temperature and vibration sensation in the left arm and leg..   Coordination: Cerebellar testing shows slightly reduced left nose finger and very reduced left heel-to-shin.  Gait and station: Station is normal.  His gait was good.  Tandem gait was slightly wide.  This is improved.  Reflexes: Deep tendon reflexes are symmetric and increased at the knees with spread. No ankle clonus.       DIAGNOSTIC DATA (LABS, IMAGING, TESTING) - I reviewed patient records, labs, notes, testing and imaging myself where available.  Lab Results  Component Value Date   WBC 11.1 (H) 12/29/2018   HGB 15.1 12/29/2018   HCT 44.9 12/29/2018   MCV 88 12/29/2018   PLT 228 12/29/2018      Component Value Date/Time   NA 145 (H) 12/29/2018 1121   K 4.0 12/29/2018 1121   CL 105 12/29/2018 1121   CO2 24 12/29/2018 1121   GLUCOSE 143 (H) 12/29/2018 1121   GLUCOSE 125 (H) 09/24/2018 0428   BUN 9 12/29/2018  1121   CREATININE 0.92 12/29/2018 1121   CALCIUM 9.9 12/29/2018 1121   PROT 6.4 12/29/2018 1121   ALBUMIN 4.8 12/29/2018 1121   AST 15 12/29/2018 1121   ALT 17 12/29/2018 1121   ALKPHOS 60 12/29/2018 1121   BILITOT 0.3 12/29/2018 1121   GFRNONAA 103 12/29/2018 1121   GFRAA 119 12/29/2018 1121   _________________________  Multiple sclerosis (HCC) - Plan: IgG, IgA, IgM, CBC with Differential/Platelet  Numbness and tingling in right hand - Plan: NCV with EMG(electromyography)  Carpal tunnel syndrome of right wrist - Plan: NCV with EMG(electromyography)  OSA (obstructive sleep apnea) - Plan: Split night study  Excessive daytime sleepiness - Plan: Split night study  Attention deficit disorder (ADD) in adult  High risk medication use - Plan: IgG, IgA, IgM, CBC with Differential/Platelet  1.   Continue Ocrevus.we will check IgG/IgM and CBC with differential today..    2.   Continue Adderall for ADD and fatigue.  I will add Wellbutrin as it may also help fatigue further 3.   Stay active and exercise as tolerated. 4.   Split-night PSG for suspected obstructive sleep apnea.  His wife has witnessed pauses in breathing.  He has EDS.   5.   NCV/EMG for carpal tunnel and/or radiculopathy on the right  6..  He will return to see [place] in 6 months or sooner if there are new or worsening neurologic symptoms.  40-minute visit with the majority of the time face-to-face discussing new issues and issues related to his MS.  Additional time with record review and documentation.

## 2019-07-01 LAB — IGG, IGA, IGM
IgA/Immunoglobulin A, Serum: 119 mg/dL (ref 90–386)
IgG (Immunoglobin G), Serum: 578 mg/dL — ABNORMAL LOW (ref 603–1613)
IgM (Immunoglobulin M), Srm: 26 mg/dL (ref 20–172)

## 2019-07-01 LAB — CBC WITH DIFFERENTIAL/PLATELET
Basophils Absolute: 0.1 10*3/uL (ref 0.0–0.2)
Basos: 1 %
EOS (ABSOLUTE): 0.4 10*3/uL (ref 0.0–0.4)
Eos: 6 %
Hematocrit: 45.4 % (ref 37.5–51.0)
Hemoglobin: 15.9 g/dL (ref 13.0–17.7)
Immature Grans (Abs): 0 10*3/uL (ref 0.0–0.1)
Immature Granulocytes: 1 %
Lymphocytes Absolute: 1.5 10*3/uL (ref 0.7–3.1)
Lymphs: 22 %
MCH: 29.9 pg (ref 26.6–33.0)
MCHC: 35 g/dL (ref 31.5–35.7)
MCV: 85 fL (ref 79–97)
Monocytes Absolute: 0.6 10*3/uL (ref 0.1–0.9)
Monocytes: 9 %
Neutrophils Absolute: 4.3 10*3/uL (ref 1.4–7.0)
Neutrophils: 61 %
Platelets: 258 10*3/uL (ref 150–450)
RBC: 5.32 x10E6/uL (ref 4.14–5.80)
RDW: 13.2 % (ref 11.6–15.4)
WBC: 6.9 10*3/uL (ref 3.4–10.8)

## 2019-07-08 ENCOUNTER — Telehealth: Payer: Self-pay

## 2019-07-08 DIAGNOSIS — G4733 Obstructive sleep apnea (adult) (pediatric): Secondary | ICD-10-CM

## 2019-07-08 DIAGNOSIS — G4719 Other hypersomnia: Secondary | ICD-10-CM

## 2019-07-08 NOTE — Addendum Note (Signed)
Addended by: Arther Abbott on: 07/08/2019 10:38 AM   Modules accepted: Orders

## 2019-07-08 NOTE — Telephone Encounter (Signed)
Patients insurance has denied in lab sleep study request. They have authorized for the home sleep test instead. Can I get an order for that please.

## 2019-07-27 ENCOUNTER — Other Ambulatory Visit: Payer: Self-pay

## 2019-07-27 ENCOUNTER — Encounter (INDEPENDENT_AMBULATORY_CARE_PROVIDER_SITE_OTHER): Payer: BC Managed Care – PPO | Admitting: Neurology

## 2019-07-27 ENCOUNTER — Ambulatory Visit (INDEPENDENT_AMBULATORY_CARE_PROVIDER_SITE_OTHER): Payer: BC Managed Care – PPO | Admitting: Neurology

## 2019-07-27 DIAGNOSIS — Z0289 Encounter for other administrative examinations: Secondary | ICD-10-CM

## 2019-07-27 DIAGNOSIS — R202 Paresthesia of skin: Secondary | ICD-10-CM

## 2019-07-27 DIAGNOSIS — R200 Anesthesia of skin: Secondary | ICD-10-CM | POA: Diagnosis not present

## 2019-07-27 DIAGNOSIS — G5601 Carpal tunnel syndrome, right upper limb: Secondary | ICD-10-CM

## 2019-07-27 NOTE — Progress Notes (Signed)
Full Name: Sean Espinoza Gender: Male MRN #: [id number] Date of Birth: 1975-05-29    Visit Date: 07/27/2019 08:25 Age: 44 Years Examining Physician: Arlice Colt, MD  Referring Physician: Arlice Colt, MD Height: 6 feet 1 inch   History:  Mr. Merlinda Frederick is a 44 year old man with multiple sclerosis experiencing pain and numbness in the right arm and forearm.  Numbness extends into digits 2 through 4.  He has reduced sensation to touch in the palm and dorsum adjacent to the second third and fourth fingers.  Strength was normal.  Nerve conduction studies: The right median, ulnar and radial motor responses had normal distal latencies, amplitude and forearm conduction velocities.  The right median, ulnar and radial sensory responses had normal peak latencies and amplitudes.  Ulnar F-wave responses had normal latencies.  Electromyography: Needle EMG of selected muscles of the right arm was performed.  There was mild chronic denervation in the flexor carpi ulnaris muscle and some polyphasic motor units with normal recruitment in the deltoid, pronator teres, supraspinatus and flexor pollicis longus muscles.  Impression: This NCV/EMG study shows the following: 1.   There is no evidence of polyneuropathy or median, ulnar or radial mononeuropathy. 2.   Possible mild right C7 chronic radiculopathy.  Ross Hefferan A. Felecia Shelling, MD, PhD, FAAN Certified in Neurology, Clinical Neurophysiology, Sleep Medicine, Pain Medicine and Neuroimaging Director, [facility] [address], [place], [place] [address] [phone]    Verbal informed consent was obtained from the patient, patient was informed of potential risk of procedure, including bruising, bleeding, hematoma formation, infection, muscle weakness, muscle pain, numbness, among others.       [place]    Nerve / Sites Muscle Latency Ref. Amplitude Ref. Rel Amp  Segments Distance Velocity Ref. Area    ms ms mV mV %  cm m/s m/s mVms  R Median - APB     Wrist APB 3.7 ?4.4 6.6 ?4.0 100 Wrist - APB 7   23.3     Upper arm APB 8.1  5.2  77.7 Upper arm - Wrist 22 49 ?49 17.1  R Ulnar - ADM     Wrist ADM 2.5 ?3.3 12.5 ?6.0 100 Wrist - ADM 7   45.5     B.Elbow ADM 5.9  11.7  93.2 B.Elbow - Wrist 21 60 ?49 43.6     A.Elbow ADM 7.5  11.6  99.3 A.Elbow - B.Elbow 10 65 ?49 43.2         A.Elbow - Wrist      R Radial - EIP     Forearm EIP 1.5 ?2.9 7.1 ?2.0 100 Forearm - EIP 4  ?49 34.0     Elbow EIP 4.0  7.7  109 Elbow - Forearm 16 65  42.8     Spiral Gr EIP 5.7  7.6  98.4 Spiral Gr - Elbow 11 65  42.2           SSR    Nerve / Sites Latency   s  R Sympathetic - Palm     Palm 1.58       SNC    Nerve / Sites Rec. Site Peak Lat Ref.  Amp Ref. Segments Distance    ms ms V V  cm  R Radial - Anatomical snuff box (Forearm)     Forearm Wrist 2.5 ?2.9 32 ?15 Forearm - Wrist 10  R Median - Orthodromic (Dig II,  Mid palm)     Dig II Wrist 2.9 ?3.4 18 ?10 Dig II - Wrist 13  R Ulnar - Orthodromic, (Dig V, Mid palm)     Dig V Wrist 2.5 ?3.1 8 ?5 Dig V - Wrist 47           F  Wave    Nerve F Lat Ref.   ms ms  R Ulnar - ADM 28.9 ?32.0       EMG Summary Table    Spontaneous MUAP Recruitment  Muscle IA Fib PSW Fasc Other Amp Dur. Poly Pattern  R. Deltoid Normal None None None _______ Normal Normal 1+ Normal  R. Triceps brachii Normal None None None _______ Normal Normal Normal Normal  R. Biceps brachii Normal None None None _______ Normal Normal Normal Normal  R. Extensor digitorum communis Normal None None None _______ Normal Normal Normal Normal  R. Flexor carpi ulnaris Normal None None None _______ Normal Normal 1+ Reduced  R. First dorsal interosseous Normal None None None _______ Normal Normal Normal Normal  R. Pronator teres Normal None None None _______ Normal Normal 1+ Normal  R. Supraspinatus Normal None None None _______ Normal Normal 1+ Normal    R. Flexor pollicis longus Normal None None None _______ Normal Normal 1+ Normal

## 2019-07-28 ENCOUNTER — Other Ambulatory Visit: Payer: Self-pay | Admitting: Psychiatry

## 2019-07-28 ENCOUNTER — Ambulatory Visit (INDEPENDENT_AMBULATORY_CARE_PROVIDER_SITE_OTHER): Payer: BC Managed Care – PPO | Admitting: Neurology

## 2019-07-28 DIAGNOSIS — G4733 Obstructive sleep apnea (adult) (pediatric): Secondary | ICD-10-CM

## 2019-07-28 DIAGNOSIS — G4719 Other hypersomnia: Secondary | ICD-10-CM

## 2019-08-03 NOTE — Progress Notes (Signed)
   [facility]  HOME SLEEP STUDY  STUDY DATE: 08/03/2019 PATIENT NAME: Sean Espinoza DOB: 19-Nov-1975 MRN: [id number]  ORDERING CLINICIAN: Zakyria Metzinger A. Epimenio Foot, MD, PhD REFERRING CLINICIAN: Thurmon Mizell A. [name], MD. PhD   CLINICAL INFORMATION: 44 year old man with multiple sclerosis, witness OSA, snoring and excessive daytime sleepiness  FINDINGS:  Total Record Time: 9 hours 0 minutes Total Sleep Time:  8 hours 15 minutes  Percent REM:   25%   Calculated pAHI:  12.8       REM pAHI:    29.9     NREM pAHI: 7.5  Pulse Mean:    60  pulse Range (43-85)    Oxygen Sat% Mean: 93  O2Sat Range (90-98%)  O2Sat <88%: 0 minutes    IMPRESSION:  This home sleep study shows mild overall obstructive sleep apnea that is moderately severe during REM sleep.   RECOMMENDATION: 1.  AutoPap 5-20 cm H2O with heated humidifier.  Download in 30 and 60 days. 2.  If unable to tolerate PAP, consider weight loss and an oral appliance 3.  Follow-up with Dr. Epimenio Foot in 3 months   INTERPRETING PHYSICIAN:   Cybil Senegal A. Epimenio Foot, MD, PhD, [facility] Certified in Neurology, Clinical Neurophysiology, Sleep Medicine,  [facility] [address] [place], [place] [address] [phone]

## 2019-08-04 ENCOUNTER — Encounter: Payer: Self-pay | Admitting: *Deleted

## 2019-08-04 DIAGNOSIS — G4733 Obstructive sleep apnea (adult) (pediatric): Secondary | ICD-10-CM

## 2019-08-05 NOTE — Telephone Encounter (Signed)
Called pt back. Advised we need to cx appt for 12/619 and r/s for sooner appt because he will need a f/u 30-90 days after starting on cpap machine. Rescheduled to 10/26/19 at 4pm. Pt verbalized understanding.

## 2019-08-05 NOTE — Addendum Note (Signed)
Addended by: Arther Abbott on: 08/05/2019 02:19 PM   Modules accepted: Orders

## 2019-08-05 NOTE — Telephone Encounter (Signed)
Called pt because he had not looked at [facility] yet. I relayed results per Dr. Epimenio Foot note. He verbalized understanding. He would like to start CPAP machine. Advised we will send orders to [facility] and they will f/u with him to get set up. I provided him their phone number: [phone]. He will call me back if he does not hear from them in the next couple weeks.  Faxed referral to them at [phone]. Received fax confirmation.

## 2019-10-07 ENCOUNTER — Other Ambulatory Visit: Payer: Self-pay

## 2019-10-07 MED ORDER — AMPHETAMINE-DEXTROAMPHETAMINE 15 MG PO TABS: ORAL_TABLET | ORAL | 0 refills | Status: DC

## 2019-10-07 NOTE — Telephone Encounter (Signed)
Sean Espinoza is a 44 y.o. male is calling for a refill on  On is Adderall 15 mg. Pt said he uses the [facility] in [place], [place] [address]

## 2019-10-26 ENCOUNTER — Encounter: Payer: Self-pay | Admitting: Neurology

## 2019-10-26 ENCOUNTER — Ambulatory Visit (INDEPENDENT_AMBULATORY_CARE_PROVIDER_SITE_OTHER): Payer: BC Managed Care – PPO | Admitting: Neurology

## 2019-10-26 DIAGNOSIS — G35 Multiple sclerosis: Secondary | ICD-10-CM | POA: Diagnosis not present

## 2019-10-26 NOTE — Progress Notes (Signed)
[facility]  PATIENT: Sean Espinoza DOB: Aug 15, 1975  REFERRING DOCTOR OR PCP:  Referred by Dr. Thad Ranger   SOURCE: paitent, ED notes, imaging reports, lab reports, MRI images on PACS  _________________________________   HISTORICAL  CHIEF COMPLAINT:  Chief Complaint  Patient presents with  . Follow-up    RM 13, alone. Last seen 06/30/2019.   . Multiple Sclerosis    On Ocrevus 600mg  q 6 months. Last: 07/07/19. Next: 01/19/20. Tolerating well, no issues.   . Sleep Apnea    Per [facility], they tried reaching him several times to set up CPAP. Unable to reach. They called/sent letter. Did not hear back from pt. Per pt, he lost 30lb and would prefer not to use CPAP machine. He is getting more restful sleep and wife has told him he only snores if he is very tired.    HISTORY OF PgRESENT ILLNESS:  Sean Espinoza is a 44 y.o. man  diagnosed with relapsing remitting multiple sclerosis  in early 2018.     Update 10/26/2019: He is on Ocrevus 600 mg every 6 months for MS.  The last infusion was 07/07/2019 and his next infusion will be 01/19/2020.  He has tolerated it well and denies any exacerbation or new neurologic symptoms.   Gait is good.   He denies numbness or weakness.    Bladder is doing well.   He denies any difficulty with vision.  He has some fatigue and decreased focus/attention.   Both are helped by Adderall.   HE is sleeping well most nights.   Mood is doing well.      Since last visit he had a home sleep study which showed mild OSA with an AHI equals 12.8.  He reports that he has had about a 30 pound weight loss and would prefer not to start CPAP at this time.  He states that he is sleeping more restfully and that his wife notes that he is snoring less.  Additionally he had an NCV/EMG which did not show any significant findings though a mild right C7 chronic radiculopathy cannot be ruled out.  Update 06/30/2019: He is on Ocrevus with first dose in  March/ April 2020 and has his next one in a coupe weeks. .   He had an exacerbation in February while on Tysabri.      He feels his MS has been fairly stable.  He feels his legs are doing better- back to normal and he can walk unrestricted.     He notes ringing in his ears off/on.   He notes pain in the right arm, especially if he grips an item.   He feels strength is fine.   He gets some numbness in digits 3/4 and sometimes 2.  Sometimes the numbness wakes him up.   He has had C5-C7 ACDF and has mild DJD elsewhere but no nerve root compression or spinal stenosis.    He has had fatigue.  On Adderall, he does a little better.     He snores and his wife notes gasps in breathing.  He has gained about 20 pounds.   He wears an 18 1/2 inch collar  EPWORTH SLEEPINESS SCALE  On a scale of 0 - 3 what is the chance of dozing:  Sitting and Reading:     0 Watching TV:    3 Sitting inactive in a public place: 0 Passenger in car for one hour: 3 Lying down to rest in the afternoon:  3 Sitting and talking to someone: 0 Sitting quietly after lunch:  3 In a car, stopped in traffic:  0  Total (out of 24):   12/24   Mild EDS   Update 12/29/2018: He is on Ocrevus with first dose in April. He felt bad for 2 days after the infusion (achy and tired).    He had an exacerbation in February while on Tysabri.      He feels his MS has been fairly stable.    He does note the first three fingers if the left hand and 2nd and 3rd fingers of the right hand will go numb.   His joints sometimes feel swollen.  These symptoms come and go and he notices it more if he is using his hands more.  It does not wake him up at night.     He feels his legs are doing better and he is walking better.   He can walk 2 miles without stopping now.   He has no more falls.      Update 08/26/2018 Virtual Visit He started Ocrevus in April and had no side effects.  He denies any new MS symptom but is still very weak on his left side.   He has  more pain on the right form overuse.  He has weakness in the left leg > arm and also has numbness arm > leg.   He drops items on the left.   He has difficulty walking and needs to hold on for balance.   Walking without support is slow and tenuous. He can walk 500-600 feet but balance is worse with longer distance.    He fell x 3 at work the past 2 weeks.   He feels strength never really improved much after the exacerbation in February.     Numbness is also the same.   He has trouble standing up if he falls.   He is trying to get by without a cane.   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   He was needing to go up and down ladders but no longer is doing so.   He notes a swollen sensation OS.     He notes that he sometimes has some dribbling with his urine though no frank incontinence.  He has mild fuzziness with his left vision.   He notes reduced hearing OS.       His left hand is sometimes swollen.     Fatigue seems worse.   He is on Adderall 15 mg po bid and it seemed to help more initially.      1.  Continue Ocrevus.  His next infusion will be in about 4 months. 2.  He has back at work.  Due to his impairments, we need to extend the restrictions (15 pound lifting and no ladders). 3.  Increase Adderall to 15 mg 3 times a day. 4.  Trial of Ampyra to see if gait speed and balance improve.  Kidney function was fine on the February labs.    Update 05/04/2018: He is on Tysabri and has tolerated it well.   The last JCV Ab was negative 10/2017.     He has noted more issues with gait x 2 weeks.   He woke up 3 days ago with left leg weakness that has persisted.  He fell getting out of bed on Saturday.    His balance is off since Saturday.  He notes numbness/tingling x 6 weeks.   Also vision is worse out of the last eye.    Fatigue is also a lot worse the past 6 weeks.    He is sleeping well but only gives himself about 6 hours of bedtime each day.     He is  not noting new bladder issues.   Oxybutynin helped the urinary frequency/urgency.     He stands at work and also has to [employer].   He has been working up to 60-80 hours most weeks the past year and has more stress,   He was unable to work today.      Update 02/25/2018: He is on Tysabri since he had an exacerbation 04/2017.   He is tolerating it well.    JCV Ab ws as negative   He feels his gait is more off balanced and he needs to catch himself more.   Strength is similar.  He gets a tingling sensation in his feet, left > right.    He notes the tingling more now than in the past.   He notes welling in his hands and they seem to freeze up if he is not constantly moving them .    He feels bladder frequency has increased and he has nocturia at least 4 times a night.    He has never been on medication.   He has no hesitancy.     He has reduced OS vision since diagnosis.     He notes some reduced hearing.      He feels his mood has been up and down a lot this year..   Duloxetine has not helped.   He has decreased focus/attention and fatigue.    Adderall seemed to help at first but less so now.    He no longer sees a difference when he takes it compared to a day when he does not.    He feels he gets 5-8 hours of sleep.      Update 04/02/2017:  He began to experience left-sided arm and leg weakness and dysesthesias in his feet and blurry vision in the left eye about one week ago.  Of note, his initial exacerbation with MS left optic neuritis. Vision improved but never to 20/20. Today, he is 20/200 in the left eye.  He had had some difficulty with left sided numbness but feels his current symptoms are significantly worse.   He continues to take the Tecfidera although he has excessive flushing. At the last visit we had discussed other options for treatment.   He was leaning towards Tysabri or Gilenya. We discussed both of these options in more detail. The JCV antibody is negative. Therefore, my  recommendation for him is to switch to Tysabri.   Update 03/21/2017:  He is on Tecfidera 240 mg twice a day. He is experiencing flushing with almost every dose a couple hours after he takes it. This can be severe at times. He is taking the medication with food and has tried aspirin without much success.   His MS is mostly stable and there has been no exacerbation.   He has noted some more tinnitus in the left ear and the left eye seems to be more blurry.    Left ON was his presenting symptom and he feesl he is doing much worse out of that eye.   Right eye vision is fine.   Colors are desaturated.   Fatigue is more of a problem.  Adderall  innitially helped but less so now.     Gait is ok..   The left leg goes numb easily.  He also notes mild left leg weakness.    He is more irritable and gets aggravated.   HE takes Zoloft for mild depression.   He has some decreased focus and attention, helped by the Adderall.  From 11/05/2016: MS:    He is tolerating Tecfidera ok.  No mor eflushing    He denies any significant GI issues.   He has GERD and that is unchanged.    No new exacerbation  Cognition:    A couple weeks ago, while on a teleconference, he was having a lot of difficulties getting the right words out. He felt he was speaking gibberish.   He was getting words out but they were jumbled and out of order.  After a few minutes the call was stopped and he feels this went on x 10 minutes.  He notes losing his train of thought in conversation.   MMSE today is 29/30.    He understood everything and he thought he was talking normally but his supervisor noted the changes.    He has noted decreased focus and attention.  He has had this for a while but it seems worse,   He notes missing turns and driving past where he was supposed to go a few times.     Left Sciatica:   He is noting continued lower back and leg pain.  Pain is worse first thing in the morning and is better after he's walked some. Pain started May 2018.    He notes some numbness and tingling in the left leg.   He feels weight bearing is worse on that leg.   He has no h/o lumbar spine issues.   He only notes mild buttock pain when sitting.   However, pain goes down the left leg when sitting on the left cheek.    A TPI at the last visit only helped x 1 day only.    MS:   He is tolerating Tecfidera ok.  He has had some flushing.    He denies any significant GI issues.   He has GERD and that is unchanged.    No new exacerbation  Gait/strength/sensation   He reports that his gait is doing well. He he denies any stumbling or falls. He notes no major difficulty with strength or sensation in the arms or legs.   The numbness that he was having on the left side has resolved.  Bladder: He denies any significant problem with bladder urgency or frequency or hesitancy. He does not have any incontinence.  Vision: He denies any difficulty with visual acuity, diplopia or eye pain.  Fatigue/sleep:  He notes mild fatigue, physical more than cognitive. Fatigue is worse with heat.[name]  He is sleeping worse since the leg pain started.   .  Mood:    He notes more irritability but no change in depression.   He is on Zoloft.  X 6 years, more for anxiety than depression.    MS History:   In mid March, he had the onset of blurry vision out of the left eye. He noted that the visual field was worse on the left side than the right side. Additionally he was unable to read out of the left eye. Colors were mildly desaturated. On April 2, he had the onset of left facial numbness. Over the next day the numbness increased to  involve the entire left side, worse in the arm than the leg. He also noted mild weakness in the left arm and reduced balance and gait.[name] He presented to [facility] and an MRI 07/02/2016 showed many T2/FLAIR hyperintense foci, four foci  enhanced  (right frontal subcortical white matter, right corpus callosum, left parietal subcortical white matter and left mesial  temporal lobe), consistent with the diagnosis of MS. He was admitted and received 3 days of IV Solu-Medrol. His symptoms improved some and he was discharged.  He was referred to me and Gilford Raid was started in March 2018.     REVIEW OF SYSTEMS: Constitutional: No fevers, chills, sweats, or change in appetite.   Notes fatigue. Eyes: No visual changes, double vision, eye pain Ear, nose and throat: No hearing loss, ear pain, nasal congestion, sore throat Cardiovascular: No chest pain, palpitations Respiratory: No shortness of breath at rest or with exertion.   No wheezes GastrointestinaI: No nausea, vomiting, diarrhea, abdominal pain, fecal incontinence Genitourinary: No dysuria, urinary retention or frequency.  No nocturia. Musculoskeletal: No neck pain, back pain Integumentary: No rash, pruritus, skin lesions Neurological: as above Psychiatric: No depression at this time.  Mild anxiety Endocrine: No palpitations, diaphoresis, change in appetite, change in weigh or increased thirst Hematologic/Lymphatic: No anemia, purpura, petechiae. Allergic/Immunologic: No itchy/runny eyes, nasal congestion, recent allergic reactions, rashes  ALLERGIES: No Known Allergies  HOME MEDICATIONS:  Current Outpatient Medications:  .  amphetamine-dextroamphetamine (ADDERALL) 15 MG tablet, Take one po three times a day, Disp: 90 tablet, Rfl: 0 .  buPROPion (WELLBUTRIN XL) 150 MG 24 hr tablet, Take 1 tablet (150 mg total) by mouth daily., Disp: 90 tablet, Rfl: 3 .  metoprolol tartrate (LOPRESSOR) 25 MG tablet, TAKE 1 TABLET BY MOUTH TWICE DAILY, Disp: 60 tablet, Rfl: 1 .  ocrelizumab (OCREVUS) 300 MG/10ML injection, Inject into the vein once., Disp: , Rfl:  .  omeprazole (PRILOSEC) 40 MG capsule, Take 1 capsule (40 mg total) by mouth daily., Disp: 30 capsule, Rfl: 11 No current facility-administered medications for this visit.  Facility-Administered Medications Ordered in Other Visits:  .  gadopentetate  dimeglumine (MAGNEVIST) injection 20 mL, 20 mL, Intravenous, Once PRN, Josee Speece, Pearletha Furl, MD  PAST MEDICAL HISTORY: Past Medical History:  Diagnosis Date  . Atrial fibrillation (HCC)   . Hypertension   . MS (multiple sclerosis) (HCC)   . Vision abnormalities     PAST SURGICAL HISTORY: Past Surgical History:  Procedure Laterality Date  . CERVICAL FUSION  2005, 2014    FAMILY HISTORY: Family History  Problem Relation Age of Onset  . Lupus Mother   . Healthy Father   . Lymphoma Maternal Grandfather   . Colon cancer Paternal Grandmother   . Alcohol abuse Paternal Uncle   . Alcohol abuse Paternal Grandfather   . Kidney cancer Neg Hx   . Bladder Cancer Neg Hx   . Prostate cancer Neg Hx     SOCIAL HISTORY:  Social History   Socioeconomic History  . Marital status: Married    Spouse name: teresa  . Number of children: 3  . Years of education: Not on file  . Highest education level: High school graduate  Occupational History  . Not on file  Tobacco Use  . Smoking status: Former Smoker    Quit date: 2003    Years since quitting: 18.5  . Smokeless tobacco: Never Used  Vaping Use  . Vaping Use: Never used  Substance and Sexual Activity  . Alcohol use:  Yes    Comment: social  . Drug use: No  . Sexual activity: Yes  Other Topics Concern  . Not on file  Social History Narrative  . Not on file   Social Determinants of Health   Financial Resource Strain: Low Risk   . Difficulty of Paying Living Expenses: Not hard at all  Food Insecurity: No Food Insecurity  . Worried About Programme researcher, broadcasting/film/video in the Last Year: Never true  . Ran Out of Food in the Last Year: Never true  Transportation Needs: No Transportation Needs  . Lack of Transportation (Medical): No  . Lack of Transportation (Non-Medical): No  Physical Activity: Sufficiently Active  . Days of Exercise per Week: 4 days  . Minutes of Exercise per Session: 60 min  Stress: No Stress Concern Present  . Feeling  of Stress : Only a little  Social Connections: Unknown  . Frequency of Communication with Friends and Family: Not on file  . Frequency of Social Gatherings with Friends and Family: Not on file  . Attends Religious Services: Never  . Active Member of Clubs or Organizations: No  . Attends Banker Meetings: Never  . Marital Status: Married  Catering manager Violence: Not At Risk  . Fear of Current or Ex-Partner: No  . Emotionally Abused: No  . Physically Abused: No  . Sexually Abused: No     PHYSICAL EXAM  Vitals:   10/26/19 1543  BP: (!) 146/98  Pulse: 60  Weight: (!) 268 lb 8 oz (121.8 kg)  Height:  (1.854 m)    Body mass index is 35.42 kg/m.  VA:    20/30 OS and 20/20 OD  General: The patient is well-developed and well-nourished and in no acute distress.  Pharynx is Mallampati 3.   Neurologic Exam  Mental status:    The patient is alert and oriented x 3 at the time of the examination. The patient has apparent normal recent and remote memory, with an apparently normal attention span and concentration ability.   Speech is normal.  Cranial nerves: Extraocular movements are full.      Facial strength is normal.  Trapezius and sternocleidomastoid strength is normal. No dysarthria is noted.  The tongue is midline, and the patient has symmetric elevation of the soft palate. No obvious hearing deficits are noted.  Motor:  Muscle bulk is normal.   Tone is normal. Strength is 5/5  Sensory: Sensory testing is altered in the palm adjacent to the second third and fourth fingers and in the dorsum in a similar distribution..  He has reduced touch, temperature and vibration sensation in the left arm and leg..   Coordination: Cerebellar testing shows slightly reduced left nose finger and very reduced left heel-to-shin.  Gait and station: Station is normal.  Gait is normal. Tandem gait was slightly wide.  This is improved.  Reflexes: Deep tendon reflexes are symmetric  and increased at the knees with spread. No ankle clonus.       DIAGNOSTIC DATA (LABS, IMAGING, TESTING) - I reviewed patient records, labs, notes, testing and imaging myself where available.  Lab Results  Component Value Date   WBC 6.9 06/30/2019   HGB 15.9 06/30/2019   HCT 45.4 06/30/2019   MCV 85 06/30/2019   PLT 258 06/30/2019      Component Value Date/Time   NA 145 (H) 12/29/2018 1121   K 4.0 12/29/2018 1121   CL 105 12/29/2018 1121   CO2 24 12/29/2018  1121   GLUCOSE 143 (H) 12/29/2018 1121   GLUCOSE 125 (H) 09/24/2018 0428   BUN 9 12/29/2018 1121   CREATININE 0.92 12/29/2018 1121   CALCIUM 9.9 12/29/2018 1121   PROT 6.4 12/29/2018 1121   ALBUMIN 4.8 12/29/2018 1121   AST 15 12/29/2018 1121   ALT 17 12/29/2018 1121   ALKPHOS 60 12/29/2018 1121   BILITOT 0.3 12/29/2018 1121   GFRNONAA 103 12/29/2018 1121   GFRAA 119 12/29/2018 1121   _________________________  No diagnosis found.  1.   Continue Ocrevus    2.   Continue Adderall for ADD and fatigue.    3.   Stay active and exercise as tolerated. 4.    Note for CDL/DOT. 5.   He will return to see [place] in 6 months or sooner if there are new or worsening neurologic symptoms.  40-minute visit with the majority of the time face-to-face discussing new issues and issues related to his MS.  Additional time with record review and documentation.

## 2019-10-28 ENCOUNTER — Telehealth: Payer: Self-pay | Admitting: Neurology

## 2019-10-28 NOTE — Telephone Encounter (Signed)
no to the covid questions MR Brain w/wo contrast Dr. Gershon Mussel Auth: [id number] (exp. 10/28/19 to 11/26/19). Patient is scheduled at [facility] for 11/03/19.

## 2019-11-03 ENCOUNTER — Other Ambulatory Visit: Payer: BC Managed Care – PPO

## 2019-12-09 ENCOUNTER — Other Ambulatory Visit: Payer: Self-pay | Admitting: Neurology

## 2019-12-09 MED ORDER — AMPHETAMINE-DEXTROAMPHETAMINE 15 MG PO TABS
ORAL_TABLET | ORAL | 0 refills | Status: DC
Start: 2019-12-09 — End: 2020-02-08

## 2019-12-09 NOTE — Telephone Encounter (Signed)
Pt called needing a refill on his amphetamine-dextroamphetamine (ADDERALL) 15 MG tablet sent in to the [facility] in [place]

## 2020-01-05 ENCOUNTER — Ambulatory Visit: Payer: BC Managed Care – PPO | Admitting: Neurology

## 2020-01-18 ENCOUNTER — Telehealth: Payer: Self-pay | Admitting: *Deleted

## 2020-01-18 NOTE — Telephone Encounter (Signed)
Sent pt mychart message

## 2020-01-21 ENCOUNTER — Other Ambulatory Visit: Payer: Self-pay | Admitting: Neurology

## 2020-02-02 ENCOUNTER — Encounter: Payer: Self-pay | Admitting: *Deleted

## 2020-02-07 ENCOUNTER — Ambulatory Visit: Payer: BLUE CROSS/BLUE SHIELD | Admitting: Adult Health

## 2020-02-08 ENCOUNTER — Other Ambulatory Visit: Payer: Self-pay | Admitting: *Deleted

## 2020-02-08 MED ORDER — AMPHETAMINE-DEXTROAMPHETAMINE 15 MG PO TABS
ORAL_TABLET | ORAL | 0 refills | Status: DC
Start: 2020-02-08 — End: 2020-04-06

## 2020-02-08 NOTE — Progress Notes (Signed)
Meds ordered this encounter  Medications  . amphetamine-dextroamphetamine (ADDERALL) 15 MG tablet    Sig: Take one po three times a day    Dispense:  90 tablet    Refill:  0    Dr. Epimenio Foot out of office. Dr. Terrace Arabia covering MD.

## 2020-02-17 ENCOUNTER — Ambulatory Visit
Admission: EM | Admit: 2020-02-17 | Discharge: 2020-02-17 | Disposition: A | Discharge status: Home / Self Care | Payer: BC Managed Care – PPO | Attending: Internal Medicine | Admitting: Internal Medicine

## 2020-02-17 ENCOUNTER — Other Ambulatory Visit: Payer: Self-pay

## 2020-02-17 DIAGNOSIS — Z20822 Contact with and (suspected) exposure to covid-19: Secondary | ICD-10-CM | POA: Diagnosis not present

## 2020-02-17 DIAGNOSIS — J111 Influenza due to unidentified influenza virus with other respiratory manifestations: Secondary | ICD-10-CM | POA: Diagnosis present

## 2020-02-17 DIAGNOSIS — R059 Cough, unspecified: Secondary | ICD-10-CM | POA: Diagnosis not present

## 2020-02-17 DIAGNOSIS — R509 Fever, unspecified: Secondary | ICD-10-CM | POA: Diagnosis not present

## 2020-02-17 LAB — RESP PANEL BY RT-PCR (RSV, FLU A&B, COVID)  RVPGX2
Influenza A by PCR: NEGATIVE
Influenza B by PCR: NEGATIVE
Resp Syncytial Virus by PCR: NEGATIVE
SARS Coronavirus 2 by RT PCR: NEGATIVE

## 2020-02-17 NOTE — Discharge Instructions (Addendum)
It is possible you are here a little too early for the Covid test to detect the virus, so if you still sick in 2 days, go get tested again. In the mean time stay off woork til Monday.  You may take the following supplements to help your immune system be stronger to fight this viral infection Take Quarcetin 500 mg three times a day x 7 days with Zinc 50 mg ones a day x 7 days. The quarcetin is an antiviral and anti-inflammatory supplement which helps open the zinc channels in the cell to absorb Zinc. Zinc helps decrease the virus load in your body. Take Melatonin 6-10 mg at bed time which also helps support your immune system.  Also make sure to take Vit D 5,000 IU per day with a fatty meal and Vit C 5000 mg a day til you are well.  Don't lay around, keep active and walk as much as you are able to to prevent worsening of your symptoms.  Follow up with your family Dr next week.  If you get short of breath and you are able to check  your oxygen with a pulse oxygen meter, if it gets to 92% or less, you need to go to the hospital to be admitted. If you dont have one, come back here and we will assess you.

## 2020-02-17 NOTE — ED Provider Notes (Signed)
[facility]    CSN: [id number] Arrival date & time: 02/17/20  6962      History   Chief Complaint Chief Complaint  Patient presents with  . Headache  . Fever  . Cough    HPI Sean Espinoza is a 44 y.o. male who presents with onset of HA, fever of 101 today, body aches and ST. Last night started a mild non productive cough. Has mild body aches and been fatigued as well Has never had Covid infection and has completed the Covid injections this year. Took Tylenol this am at 6 am     Past Medical History:  Diagnosis Date  . Atrial fibrillation (HCC)   . Hypertension   . MS (multiple sclerosis) (HCC)   . Vision abnormalities     Patient Active Problem List   Diagnosis Date Noted  . MDD (major depressive disorder), single episode, mild (HCC) 11/12/2018  . 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 Suicidal ideation 09/25/2018  . 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 Left-sided weakness 05/04/2018  . Attention deficit disorder (ADD) in adult 11/06/2016  . 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 Mixed hyperlipidemia 09/01/2016  . Metabolic syndrome 09/01/2016  . GERD (gastroesophageal reflux disease) 09/01/2016  . Essential hypertension 09/01/2016  . Class 1 obesity due to excess calories with serious comorbidity and body mass index (BMI) of 31.0 to 31.9 in adult 09/01/2016  . Cellulitis of left axilla 09/01/2016  . Cellulitis of left lower extremity 09/01/2016  . Anxiety 09/01/2016  . Sciatica, left side 08/27/2016  . Multiple sclerosis (HCC) 07/17/2016  . Gait disturbance 07/17/2016  . Left optic neuritis 07/17/2016  . Numbness 07/17/2016  . Blurred vision, left eye   . TIA (transient ischemic attack) 07/01/2016    Past Surgical History:  Procedure Laterality Date  . CERVICAL FUSION  2005, 2014       Home Medications    Prior to Admission medications   Medication Sig Start Date End Date  Taking? Authorizing Provider  amphetamine-dextroamphetamine (ADDERALL) 15 MG tablet Take one po three times a day 02/08/20   Levert Feinstein, MD  buPROPion (WELLBUTRIN XL) 150 MG 24 hr tablet Take 1 tablet (150 mg total) by mouth daily. 06/30/19   Sater, Pearletha Furl, MD  metoprolol tartrate (LOPRESSOR) 25 MG tablet TAKE 1 TABLET BY MOUTH TWICE DAILY 05/27/19   Clapacs, Jackquline Denmark, MD  ocrelizumab (OCREVUS) 300 MG/10ML injection Inject into the vein once.    [provider]  omeprazole (PRILOSEC) 40 MG capsule TAKE 1 CAPSULE(40 MG) BY MOUTH DAILY 01/21/20   Sater, Pearletha Furl, MD    Family History Family History  Problem Relation Age of Onset  . Lupus Mother   . Healthy Father   . Lymphoma Maternal Grandfather   . Colon cancer Paternal Grandmother   . Alcohol abuse Paternal Uncle   . Alcohol abuse Paternal Grandfather   . Kidney cancer Neg Hx   . Bladder Cancer Neg Hx   . Prostate cancer Neg Hx     Social History Social History   Tobacco Use  . Smoking status: Former Smoker    Quit date: 2003    Years since quitting: 18.8  . Smokeless tobacco: Never Used  Vaping Use  . Vaping Use: Never used  Substance Use Topics  . Alcohol use: Yes    Comment: social  . Drug use: No     Allergies   Patient has no  known allergies.   Review of Systems Review of Systems  Constitutional: Positive for chills, fatigue and fever. Negative for activity change.  HENT: Positive for congestion, postnasal drip, rhinorrhea and sore throat. Negative for ear discharge, ear pain and trouble swallowing.   Eyes: Negative for discharge.  Respiratory: Positive for cough. Negative for choking and shortness of breath.   Cardiovascular: Negative for chest pain.  Gastrointestinal: Negative for diarrhea, nausea and vomiting.  Musculoskeletal: Positive for myalgias. Negative for gait problem.  Skin: Negative for rash.  Neurological: Positive for headaches.  Hematological: Negative for adenopathy.   Physical  Exam Triage Vital Signs ED Triage Vitals  Enc Vitals Group     BP 02/17/20 0837 (!) 149/100     Pulse Rate 02/17/20 0837 89     Resp 02/17/20 0837 17     Temp 02/17/20 0837 98.7 F (37.1 C)     Temp Source 02/17/20 0837 Oral     SpO2 02/17/20 0837 100 %     Weight 02/17/20 0835 256 lb (116.1 kg)     Height 02/17/20 0835 6\' 1"  (1.854 m)     Head Circumference --      Peak Flow --      Pain Score 02/17/20 0835 4     Pain Loc --      Pain Edu? --      Excl. in GC? --    No data found.  Updated Vital Signs BP (!) 149/100 (BP Location: Left Arm)   Pulse 89   Temp 98.7 F (37.1 C) (Oral)   Resp 17   Ht 6\' 1"  (1.854 m)   Wt 256 lb (116.1 kg)   SpO2 100%   BMI 33.78 kg/m   Visual Acuity Right Eye Distance:   Left Eye Distance:   Bilateral Distance:    Right Eye Near:   Left Eye Near:    Bilateral Near:     Physical Exam Vitals and nursing note reviewed.  Constitutional:      General: He is not in acute distress.    Appearance: He is obese. He is not toxic-appearing.  HENT:     Head: Normocephalic.     Right Ear: Tympanic membrane, ear canal and external ear normal.     Left Ear: Tympanic membrane, ear canal and external ear normal.     Nose: Rhinorrhea present. No congestion.     Mouth/Throat:     Mouth: Mucous membranes are moist.  Eyes:     General: No scleral icterus.    Extraocular Movements: Extraocular movements intact.  Cardiovascular:     Rate and Rhythm: Normal rate and regular rhythm.     Heart sounds: No murmur heard.   Pulmonary:     Effort: Pulmonary effort is normal.     Breath sounds: Normal breath sounds.  Musculoskeletal:        General: Normal range of motion.     Cervical back: Neck supple. No rigidity.  Lymphadenopathy:     Cervical: No cervical adenopathy.  Skin:    General: Skin is warm and dry.  Neurological:     Mental Status: He is alert and oriented to person, place, and time.     Gait: Gait normal.  Psychiatric:         Mood and Affect: Mood normal.        Behavior: Behavior normal.        Thought Content: Thought content normal.        Judgment:  Judgment normal.    UC Treatments / Results  Labs (all labs ordered are listed, but only abnormal results are displayed) Labs Reviewed  RESP PANEL BY RT PCR (RSV, FLU A&B, COVID)    EKG   Radiology No results found.  Procedures Procedures (including critical care time)  Medications Ordered in UC Medications - No data to display  Initial Impression / Assessment and Plan / UC Course  I have reviewed the triage vital signs and the nursing notes. Viral URI. See instructions Pertinent labs  results that were available during my care of the patient were reviewed by me and considered in my medical decision making (see chart for details).  Final Clinical Impressions(s) / UC Diagnoses   Final diagnoses:  None   Discharge Instructions   None    ED Prescriptions    None     PDMP not reviewed this encounter.   Garey Ham, PA-C 02/17/20 1211

## 2020-02-17 NOTE — ED Triage Notes (Signed)
Pt with headache, fever, cough, and sore throat starting on Tuesday. Endorses fatigue

## 2020-04-06 ENCOUNTER — Other Ambulatory Visit: Payer: Self-pay

## 2020-04-06 MED ORDER — AMPHETAMINE-DEXTROAMPHETAMINE 15 MG PO TABS
ORAL_TABLET | ORAL | 0 refills | Status: DC
Start: 2020-04-06 — End: 2020-04-27

## 2020-04-12 ENCOUNTER — Ambulatory Visit
Admission: EM | Admit: 2020-04-12 | Discharge: 2020-04-12 | Disposition: A | Discharge status: Home / Self Care | Payer: BC Managed Care – PPO | Attending: Family Medicine | Admitting: Family Medicine

## 2020-04-12 ENCOUNTER — Other Ambulatory Visit: Payer: Self-pay

## 2020-04-12 DIAGNOSIS — R509 Fever, unspecified: Secondary | ICD-10-CM

## 2020-04-12 NOTE — ED Provider Notes (Signed)
[facility]    CSN: [id number] Arrival date & time: 04/12/20  1404      History   Chief Complaint Chief Complaint  Patient presents with  . Cough    HPI ERDEM NAAS is a 45 y.o. male.   Pt is a 45 year old male with past medical history of A. fib, hypertension, MS.  He presents today for cough, headache, fevers, body aches and fatigue for 2 days.  Concern for possible COVID.  Reports sore throat this AM.  Had infusion for MS approximately 6 weeks ago.  Has been COVID vaccinated.     Past Medical History:  Diagnosis Date  . Atrial fibrillation (HCC)   . Hypertension   . MS (multiple sclerosis) (HCC)   . Vision abnormalities     Patient Active Problem List   Diagnosis Date Noted  . MDD (major depressive disorder), single episode, mild (HCC) 11/12/2018  . 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 Suicidal ideation 09/25/2018  . 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 Left-sided weakness 05/04/2018  . Attention deficit disorder (ADD) in adult 11/06/2016  . 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 Mixed hyperlipidemia 09/01/2016  . Metabolic syndrome 09/01/2016  . GERD (gastroesophageal reflux disease) 09/01/2016  . Essential hypertension 09/01/2016  . Class 1 obesity due to excess calories with serious comorbidity and body mass index (BMI) of 31.0 to 31.9 in adult 09/01/2016  . Cellulitis of left axilla 09/01/2016  . Cellulitis of left lower extremity 09/01/2016  . Anxiety 09/01/2016  . Sciatica, left side 08/27/2016  . Multiple sclerosis (HCC) 07/17/2016  . Gait disturbance 07/17/2016  . Left optic neuritis 07/17/2016  . Numbness 07/17/2016  . Blurred vision, left eye   . TIA (transient ischemic attack) 07/01/2016    Past Surgical History:  Procedure Laterality Date  . CERVICAL FUSION  2005, 2014       Home Medications    Prior to Admission medications   Medication Sig Start Date End  Date Taking? Authorizing Provider  amphetamine-dextroamphetamine (ADDERALL) 15 MG tablet Take one po three times a day 04/06/20   Dohmeier, Porfirio Mylar, MD  buPROPion (WELLBUTRIN XL) 150 MG 24 hr tablet Take 1 tablet (150 mg total) by mouth daily. 06/30/19   Sater, Pearletha Furl, MD  metoprolol tartrate (LOPRESSOR) 25 MG tablet TAKE 1 TABLET BY MOUTH TWICE DAILY 05/27/19   Clapacs, Jackquline Denmark, MD  ocrelizumab (OCREVUS) 300 MG/10ML injection Inject into the vein once.    [provider]  omeprazole (PRILOSEC) 40 MG capsule TAKE 1 CAPSULE(40 MG) BY MOUTH DAILY 01/21/20   Sater, Pearletha Furl, MD    Family History Family History  Problem Relation Age of Onset  . Lupus Mother   . Healthy Father   . Lymphoma Maternal Grandfather   . Colon cancer Paternal Grandmother   . Alcohol abuse Paternal Uncle   . Alcohol abuse Paternal Grandfather   . Kidney cancer Neg Hx   . Bladder Cancer Neg Hx   . Prostate cancer Neg Hx     Social History Social History   Tobacco Use  . Smoking status: Former Smoker    Quit date: 2003    Years since quitting: 19.0  . Smokeless tobacco: Never Used  Vaping Use  . Vaping Use: Never used  Substance Use Topics  . Alcohol use: Yes    Comment: social  . Drug use: No     Allergies   Patient has  no known allergies.   Review of Systems Review of Systems   Physical Exam Triage Vital Signs ED Triage Vitals  Enc Vitals Group     BP 04/12/20 1416 (!) 154/109     Pulse Rate 04/12/20 1416 85     Resp 04/12/20 1416 19     Temp 04/12/20 1416 99 F (37.2 C)     Temp src --      SpO2 04/12/20 1416 97 %     Weight --      Height --      Head Circumference --      Peak Flow --      Pain Score 04/12/20 1413 0     Pain Loc --      Pain Edu? --      Excl. in GC? --    No data found.  Updated Vital Signs BP (!) 154/109   Pulse 85   Temp 99 F (37.2 C)   Resp 19   SpO2 97%   Visual Acuity Right Eye Distance:   Left Eye Distance:   Bilateral Distance:     Right Eye Near:   Left Eye Near:    Bilateral Near:     Physical Exam Vitals and nursing note reviewed.  Constitutional:      General: He is not in acute distress.    Appearance: Normal appearance. He is not ill-appearing, toxic-appearing or diaphoretic.  HENT:     Head: Normocephalic and atraumatic.     Right Ear: Tympanic membrane and ear canal normal.     Left Ear: Tympanic membrane and ear canal normal.     Nose: Nose normal.     Mouth/Throat:     Pharynx: Oropharynx is clear.  Eyes:     Conjunctiva/sclera: Conjunctivae normal.  Cardiovascular:     Rate and Rhythm: Normal rate and regular rhythm.  Pulmonary:     Effort: Pulmonary effort is normal.     Breath sounds: Normal breath sounds.  Musculoskeletal:        General: Normal range of motion.     Cervical back: Normal range of motion.  Skin:    General: Skin is warm and dry.  Neurological:     Mental Status: He is alert.  Psychiatric:        Mood and Affect: Mood normal.      UC Treatments / Results  Labs (all labs ordered are listed, but only abnormal results are displayed) Labs Reviewed  COVID-19, FLU A+B NAA    EKG   Radiology No results found.  Procedures Procedures (including critical care time)  Medications Ordered in UC Medications - No data to display  Initial Impression / Assessment and Plan / UC Course  I have reviewed the triage vital signs and the nursing notes.  Pertinent labs & imaging results that were available during my care of the patient were reviewed by me and considered in my medical decision making (see chart for details).     Fever with viral illness COVID and flu swab pending. Recommend over-the-counter medicines as needed. Follow up as needed for continued or worsening symptoms  Final Clinical Impressions(s) / UC Diagnoses   Final diagnoses:  Fever, unspecified     Discharge Instructions     This is most likely viral Concern for covid or flu. Test pending.   You can take OTC medicines as needed.  Follow up as needed for continued or worsening symptoms      ED Prescriptions  None     PDMP not reviewed this encounter.   Janace Aris, NP 04/12/20 1536

## 2020-04-12 NOTE — ED Triage Notes (Addendum)
Pt presents with complaints of headache, fever, cough, body aches, and fatigue x 2 days. Concerned for COVID. Reports throat became sore this am as well. Reports having MS infusion x 6 weeks ago.

## 2020-04-12 NOTE — Discharge Instructions (Addendum)
This is most likely viral Concern for covid or flu. Test pending.  You can take OTC medicines as needed.  Follow up as needed for continued or worsening symptoms

## 2020-04-15 LAB — COVID-19, FLU A+B NAA
Influenza A, NAA: NOT DETECTED
Influenza B, NAA: NOT DETECTED
SARS-CoV-2, NAA: DETECTED — AB

## 2020-04-27 ENCOUNTER — Ambulatory Visit: Payer: BC Managed Care – PPO | Admitting: Neurology

## 2020-04-27 ENCOUNTER — Encounter: Payer: Self-pay | Admitting: Neurology

## 2020-04-27 VITALS — BP 128/85 | HR 84 | Ht 73.0 in | Wt 266.0 lb

## 2020-04-27 DIAGNOSIS — R202 Paresthesia of skin: Secondary | ICD-10-CM

## 2020-04-27 DIAGNOSIS — G4733 Obstructive sleep apnea (adult) (pediatric): Secondary | ICD-10-CM | POA: Diagnosis not present

## 2020-04-27 DIAGNOSIS — G35 Multiple sclerosis: Secondary | ICD-10-CM | POA: Diagnosis not present

## 2020-04-27 DIAGNOSIS — G35D Multiple sclerosis, unspecified: Secondary | ICD-10-CM

## 2020-04-27 DIAGNOSIS — R200 Anesthesia of skin: Secondary | ICD-10-CM | POA: Diagnosis not present

## 2020-04-27 DIAGNOSIS — R29898 Other symptoms and signs involving the musculoskeletal system: Secondary | ICD-10-CM

## 2020-04-27 DIAGNOSIS — Z79899 Other long term (current) drug therapy: Secondary | ICD-10-CM

## 2020-04-27 DIAGNOSIS — H469 Unspecified optic neuritis: Secondary | ICD-10-CM

## 2020-04-27 MED ORDER — BUPROPION HCL ER (XL) 150 MG PO TB24: 150.0000 mg | ORAL_TABLET | Freq: Every day | ORAL | 3 refills | Status: DC

## 2020-04-27 MED ORDER — AMPHETAMINE-DEXTROAMPHETAMINE 20 MG PO TABS: ORAL_TABLET | ORAL | 0 refills | Status: DC

## 2020-04-27 NOTE — Progress Notes (Signed)
[facility]  PATIENT: Sean Espinoza DOB: 1975-09-08  REFERRING DOCTOR OR PCP:  Referred by Dr. Thad Ranger   SOURCE: paitent, ED notes, imaging reports, lab reports, MRI images on PACS  _________________________________   HISTORICAL  CHIEF COMPLAINT:  Chief Complaint  Patient presents with  . Follow-up    RM 12, alone. Last seen 10/26/19. On Ocrevus for MS. Last infusion: 03/16/20, next: 09/28/20. States he has felt great since last infusion.  Having weakness in left hand that has worsened. Unable to hold items (for example- phone). It will fall out. If he drops something, he has to pick it up with right hand, unable to with left hand.    HISTORY OF PgRESENT ILLNESS:  Sean Espinoza is a 45 y.o. man  diagnosed with relapsing remitting multiple sclerosis in early 2018.     Update 04/27/2020: He is on Ocrevus 600 mg every 6 months for MS.  Next infusion is 09/28/20. Labs show mildly reduced IgG (578 06/30/2019 and low normal IgM).   The last infusion was 07/07/2019 and his next infusion will be 01/19/2020.  He has tolerated it well and denies any exacerbation or new neurologic symptoms.     Gait is ok with mildly reduced balance.   He holds the bannister with stairs.   He notes mild left hand weakness and sometimes has some cramping in the thumb and index fingers.  He had cervical spine fusion x 2 (C5-C7 now)        Bladder is doing well.   He denies any difficulty with vision.  He has some fatigue and decreased focus/attention.   He takes Adderall at 700, 1200 and 1700 most days.   He sometime has insomnia but if he does not take the third dose he feels more fatigued and less attentive in the late afternoon or evening.   Mood is doing well on Wellbutrin.     Home sleep study 2020 that showed mild OSA with an AHI equals 12.8.  He had 30 pound weight loss and would prefer not to start CPAP at this time.  His wife noted that he is snoring less  NCV/EMG 2021 of left arm  showed no significant findings though a mild right C7 chronic radiculopathy cannot be ruled out.  He works for [employer] and does bending and  lifting but weight is usually not high.  MS History:    In mid March 2018, he had the onset of blurry vision out of the left eye. He noted that the visual field was worse on the left side than the right side. Additionally he was unable to read out of the left eye. Colors were mildly desaturated. On April 2, he had the onset of left facial numbness. Over the next day the numbness increased to involve the entire left side, worse in the arm than the leg. He also noted mild weakness in the left arm and reduced balance and gait.[name] He presented to [facility] and an MRI 07/02/2016 showed many T2/FLAIR hyperintense foci, four foci  enhanced  (right frontal subcortical white matter, right corpus callosum, left parietal subcortical white matter and left mesial temporal lobe), consistent with the diagnosis of MS. He was admitted and received 3 days of IV Solu-Medrol. His symptoms improved some and he was discharged.  He was referred to me and Gilford Raid was started in March 2018.   Due to an exacerbation 04/2017, he switched to Tysabri.    He had an exacerbation 05/2018 and  he switched to [facility] 07/2018.    IMAGING: MRI 07/02/2016 showed many T2/FLAIR hyperintense foci, four foci  enhanced  (right frontal subcortical white matter, right corpus callosum, left parietal subcortical white matter and left mesial temporal lobe),   MRI brain 05/13/2018 showed Multiple T2/flair hyperintense foci in the hemispheres, cerebellum and brainstem in a pattern and configuration consistent with chronic demyelinating plaque associated with multiple sclerosis.  None of the foci appears to be acute.  Compared to the MRI dated 03/28/2018, there is no interval change.   There are no acute findings and a normal enhancement pattern.  MRI cervical spine 05/13/2018 showed no demyelinating plaques in the  spinal coed.    C5-C7 fusion with metal hardware at C6-C7.     Mild multilevel degenerative changes as detailed above that do not lead to nerve root compression or spinal cord stenosis.  Unchanged from 2018 MRI.    MRI Brain 03/28/2018 T2/flair hyperintense foci in the left middle cerebellar peduncle, left pons and right cerebellar hemisphere.  None of these appear to be acute and they were all present on the previous MRI.  Additionally, there are T2/flair hyperintense foci in the juxtacortical, periventricular and deep white matter of both hemispheres.  None of these appear to be acute.  Some of the periventricular foci are radially oriented to the ventricles.  They were all present on the MRI from 07/02/2016. [name]  MRI cervical spine 03/28/2018 showed a normal spinal coed.    chronic C5-C7 fusion with metal hardware at C6-C7.     Mild multilevel mild degenerative changes without nerve root compression ir spinal compression    REVIEW OF SYSTEMS: Constitutional: No fevers, chills, sweats, or change in appetite.   Notes fatigue. Eyes: No visual changes, double vision, eye pain Ear, nose and throat: No hearing loss, ear pain, nasal congestion, sore throat Cardiovascular: No chest pain, palpitations Respiratory: No shortness of breath at rest or with exertion.   No wheezes GastrointestinaI: No nausea, vomiting, diarrhea, abdominal pain, fecal incontinence Genitourinary: No dysuria, urinary retention or frequency.  No nocturia. Musculoskeletal: No neck pain, back pain Integumentary: No rash, pruritus, skin lesions Neurological: as above Psychiatric: No depression at this time.  Mild anxiety Endocrine: No palpitations, diaphoresis, change in appetite, change in weigh or increased thirst Hematologic/Lymphatic: No anemia, purpura, petechiae. Allergic/Immunologic: No itchy/runny eyes, nasal congestion, recent allergic reactions, rashes  ALLERGIES: No Known Allergies  HOME MEDICATIONS:  Current  Outpatient Medications:  .  amphetamine-dextroamphetamine (ADDERALL) 20 MG tablet, One po in morning and one po around noon, Disp: 60 tablet, Rfl: 0 .  metoprolol tartrate (LOPRESSOR) 25 MG tablet, TAKE 1 TABLET BY MOUTH TWICE DAILY, Disp: 60 tablet, Rfl: 1 .  ocrelizumab (OCREVUS) 300 MG/10ML injection, Inject into the vein once., Disp: , Rfl:  .  omeprazole (PRILOSEC) 40 MG capsule, TAKE 1 CAPSULE(40 MG) BY MOUTH DAILY, Disp: 30 capsule, Rfl: 3 .  buPROPion (WELLBUTRIN XL) 150 MG 24 hr tablet, Take 1 tablet (150 mg total) by mouth daily., Disp: 90 tablet, Rfl: 3 No current facility-administered medications for this visit.  Facility-Administered Medications Ordered in Other Visits:  .  gadopentetate dimeglumine (MAGNEVIST) injection 20 mL, 20 mL, Intravenous, Once PRN, Eilleen Davoli, Pearletha Furl, MD  PAST MEDICAL HISTORY: Past Medical History:  Diagnosis Date  . Atrial fibrillation (HCC)   . Hypertension   . MS (multiple sclerosis) (HCC)   . Vision abnormalities     PAST SURGICAL HISTORY: Past Surgical History:  Procedure Laterality Date  .  CERVICAL FUSION  2005, 2014    FAMILY HISTORY: Family History  Problem Relation Age of Onset  . Lupus Mother   . Healthy Father   . Lymphoma Maternal Grandfather   . Colon cancer Paternal Grandmother   . Alcohol abuse Paternal Uncle   . Alcohol abuse Paternal Grandfather   . Kidney cancer Neg Hx   . Bladder Cancer Neg Hx   . Prostate cancer Neg Hx     SOCIAL HISTORY:  Social History   Socioeconomic History  . Marital status: Married    Spouse name: teresa  . Number of children: 3  . Years of education: Not on file  . Highest education level: High school graduate  Occupational History  . Not on file  Tobacco Use  . Smoking status: Former Smoker    Quit date: 2003    Years since quitting: 19.0  . Smokeless tobacco: Never Used  Vaping Use  . Vaping Use: Never used  Substance and Sexual Activity  . Alcohol use: Yes    Comment:  social  . Drug use: No  . Sexual activity: Yes  Other Topics Concern  . Not on file  Social History Narrative  . Not on file   Social Determinants of Health   Financial Resource Strain: Not on file  Food Insecurity: Not on file  Transportation Needs: Not on file  Physical Activity: Not on file  Stress: Not on file  Social Connections: Not on file  Intimate Partner Violence: Not on file     PHYSICAL EXAM  Vitals:   04/27/20 0948  BP: 128/85  Pulse: 84  SpO2: 98%  Weight: 266 lb (120.7 kg)  Height: 6\' 1"  (1.854 m)    Body mass index is 35.09 kg/m.   General: The patient is well-developed and well-nourished and in no acute distress.  Pharynx is Mallampati 3.   Neurologic Exam  Mental status:    The patient is alert and oriented x 3 at the time of the examination. The patient has apparent normal recent and remote memory, with an apparently normal attention span and concentration ability.   Speech is normal.  Cranial nerves: Extraocular movements are full.      Facial strength is normal.  Trapezius and sternocleidomastoid strength is normal. No dysarthria is noted.  No obvious hearing deficits are noted.  Motor:  Muscle bulk is normal.   Tone is normal. Strength is 5/5 on the right but 4++/5 in biceps, APB and FDIO but 5/5 elsewhere.    Sensory: Sensory testing is altered in the palm and dorsum adjacent to the first second third fingers..  He has reduced touch, temperature and vibration sensation in the left arm and leg..   Coordination: Cerebellar testing shows slightly reduced left nose finger and very reduced left heel-to-shin.  Gait and station: Station is normal.  Gait is normal. Tandem gait was slightly wide.  This is improved.  Reflexes: Deep tendon reflexes are symmetric and normal in arms but increased at the knees with spread. No ankle clonus.       DIAGNOSTIC DATA (LABS, IMAGING, TESTING) - I reviewed patient records, labs, notes, testing and imaging  myself where available.  Lab Results  Component Value Date   WBC 6.9 06/30/2019   HGB 15.9 06/30/2019   HCT 45.4 06/30/2019   MCV 85 06/30/2019   PLT 258 06/30/2019      Component Value Date/Time   NA 145 (H) 12/29/2018 1121   K 4.0 12/29/2018 1121  CL 105 12/29/2018 1121   CO2 24 12/29/2018 1121   GLUCOSE 143 (H) 12/29/2018 1121   GLUCOSE 125 (H) 09/24/2018 0428   BUN 9 12/29/2018 1121   CREATININE 0.92 12/29/2018 1121   CALCIUM 9.9 12/29/2018 1121   PROT 6.4 12/29/2018 1121   ALBUMIN 4.8 12/29/2018 1121   AST 15 12/29/2018 1121   ALT 17 12/29/2018 1121   ALKPHOS 60 12/29/2018 1121   BILITOT 0.3 12/29/2018 1121   GFRNONAA 103 12/29/2018 1121   GFRAA 119 12/29/2018 1121   _________________________  Multiple sclerosis (HCC) - Plan: MR BRAIN WO CONTRAST, MR CERVICAL SPINE WO CONTRAST, IgG, IgA, IgM, CBC with Differential/Platelet  Left arm weakness - Plan: MR BRAIN WO CONTRAST, MR CERVICAL SPINE WO CONTRAST, IgG, IgA, IgM, CBC with Differential/Platelet  Numbness and tingling in right hand  OSA (obstructive sleep apnea)  High risk medication use  Left optic neuritis  1.   Continue Ocrevus.   Check labs (IgG was low in past).   Check MRI to determine if signficiant progression or non-MS explanation for left arm symptoms.   2.   Continue Adderall for ADD and fatigue but change to 20 mg x 2  3.   Stay active and exercise as tolerated. 4.   He will return to see [place] in 6 months or sooner if there are new or worsening neurologic symptoms.

## 2020-04-28 LAB — CBC WITH DIFFERENTIAL/PLATELET
Basophils Absolute: 0 10*3/uL (ref 0.0–0.2)
Basos: 1 %
EOS (ABSOLUTE): 0.3 10*3/uL (ref 0.0–0.4)
Eos: 5 %
Hematocrit: 44.6 % (ref 37.5–51.0)
Hemoglobin: 15.4 g/dL (ref 13.0–17.7)
Immature Grans (Abs): 0 10*3/uL (ref 0.0–0.1)
Immature Granulocytes: 0 %
Lymphocytes Absolute: 1.8 10*3/uL (ref 0.7–3.1)
Lymphs: 32 %
MCH: 30 pg (ref 26.6–33.0)
MCHC: 34.5 g/dL (ref 31.5–35.7)
MCV: 87 fL (ref 79–97)
Monocytes Absolute: 0.6 10*3/uL (ref 0.1–0.9)
Monocytes: 11 %
Neutrophils Absolute: 2.8 10*3/uL (ref 1.4–7.0)
Neutrophils: 51 %
Platelets: 303 10*3/uL (ref 150–450)
RBC: 5.13 x10E6/uL (ref 4.14–5.80)
RDW: 13.1 % (ref 11.6–15.4)
WBC: 5.5 10*3/uL (ref 3.4–10.8)

## 2020-04-28 LAB — IGG, IGA, IGM
IgA/Immunoglobulin A, Serum: 122 mg/dL (ref 90–386)
IgG (Immunoglobin G), Serum: 553 mg/dL — ABNORMAL LOW (ref 603–1613)
IgM (Immunoglobulin M), Srm: 28 mg/dL (ref 20–172)

## 2020-05-01 ENCOUNTER — Telehealth: Payer: Self-pay | Admitting: Neurology

## 2020-05-01 NOTE — Telephone Encounter (Signed)
no to the covid questions MR Brain wo contrast & MR Cervical spine wo contrast Dr. Gershon Mussel Berkley Harvey: [phone] (exp. 04/27/20 to 05/26/20). Patent is scheduled at [facility] for 05/10/20.

## 2020-05-02 ENCOUNTER — Other Ambulatory Visit: Payer: Self-pay | Admitting: Neurology

## 2020-05-10 ENCOUNTER — Other Ambulatory Visit: Payer: BC Managed Care – PPO

## 2020-06-19 ENCOUNTER — Other Ambulatory Visit: Payer: Self-pay

## 2020-06-20 MED ORDER — AMPHETAMINE-DEXTROAMPHETAMINE 20 MG PO TABS: ORAL_TABLET | ORAL | 0 refills | Status: DC

## 2020-08-03 ENCOUNTER — Other Ambulatory Visit: Payer: Self-pay

## 2020-08-03 MED ORDER — AMPHETAMINE-DEXTROAMPHETAMINE 20 MG PO TABS: ORAL_TABLET | ORAL | 0 refills | Status: DC

## 2020-09-05 ENCOUNTER — Other Ambulatory Visit: Payer: Self-pay

## 2020-09-05 MED ORDER — AMPHETAMINE-DEXTROAMPHETAMINE 20 MG PO TABS: ORAL_TABLET | ORAL | 0 refills | Status: DC

## 2020-10-01 ENCOUNTER — Other Ambulatory Visit: Payer: Self-pay

## 2020-10-03 MED ORDER — AMPHETAMINE-DEXTROAMPHETAMINE 20 MG PO TABS: ORAL_TABLET | ORAL | 0 refills | Status: DC

## 2020-10-19 ENCOUNTER — Ambulatory Visit: Payer: BC Managed Care – PPO | Admitting: Neurology

## 2020-10-19 ENCOUNTER — Encounter: Payer: Self-pay | Admitting: Neurology

## 2020-10-19 VITALS — BP 139/97 | HR 86 | Ht 73.0 in | Wt 261.0 lb

## 2020-10-19 DIAGNOSIS — R2689 Other abnormalities of gait and mobility: Secondary | ICD-10-CM | POA: Diagnosis not present

## 2020-10-19 DIAGNOSIS — G4733 Obstructive sleep apnea (adult) (pediatric): Secondary | ICD-10-CM

## 2020-10-19 DIAGNOSIS — G35 Multiple sclerosis: Secondary | ICD-10-CM | POA: Diagnosis not present

## 2020-10-19 DIAGNOSIS — E559 Vitamin D deficiency, unspecified: Secondary | ICD-10-CM | POA: Diagnosis not present

## 2020-10-19 DIAGNOSIS — R200 Anesthesia of skin: Secondary | ICD-10-CM

## 2020-10-19 DIAGNOSIS — R202 Paresthesia of skin: Secondary | ICD-10-CM

## 2020-10-19 MED ORDER — METHYLPHENIDATE HCL ER (OSM) 54 MG PO TBCR: 54.0000 mg | EXTENDED_RELEASE_TABLET | ORAL | 0 refills | Status: DC

## 2020-10-19 NOTE — Progress Notes (Signed)
[facility]  PATIENT: Sean Espinoza DOB: June 19, 1975  REFERRING DOCTOR OR PCP:  Referred by Dr. Thad Ranger   SOURCE: paitent, ED notes, imaging reports, lab reports, MRI images on PACS  _________________________________   HISTORICAL  CHIEF COMPLAINT:  Chief Complaint  Patient presents with   Follow-up    Pt alone, when he went to his [employer] physical and they would not sign off cause they said he needed a cognitive testing. He denies any cognitive concerns personally. He is on ocrevus next infusion is 03/2021    HISTORY OF PgRESENT ILLNESS:  Sean Espinoza is a 45 y.o. man  diagnosed with relapsing remitting multiple sclerosis in early 2018.     Update 10/19/2020: He is on Ocrevus 600 mg every 6 months for MS.  Last  infusion is 09/28/20.   He has tolerated it well and denies any exacerbation or new neurologic symptoms.   IgG has been mildly low (553 last check; IgM normal)  Gait is ok with mildly reduced balance.   He can go downstairs without using the bannister  He has mild left hand weakness and sometimes has some cramping in the thumb and index fingers.  Leg cramps have resolved.   He had cervical spine fusion x 2 (C5-C7 now)        Bladder is doing well.   He denies any difficulty with vision.  He has some fatigue and decreased focus/attention.   He takes Adderall at 700, 1200 and 1700 most days. But notes it seems to wear off between doses.    He is sleeping better than last year.   Mood is doing well on Wellbutrin.     Home sleep study 2020 that showed mild OSA with an AHI equals 12.8.  He has since lost 35 pouds so prefers not to do CPAP .  His wife noted that he is snoring less  NCV/EMG 2021 of left arm showed no significant findings though a mild right C7 chronic radiculopathy cannot be ruled out.  He works for [employer] and does bending and  lifting but weight is usually not high.   For work he needs a Retail banker.  Montreal Cognitive  Assessment  10/19/2020  Visuospatial/ Executive (0/5) 4  Naming (0/3) 3  Attention: Read list of digits (0/2) 2  Attention: Read list of letters (0/1) 1  Attention: Serial 7 subtraction starting at 100 (0/3) 3  Language: Repeat phrase (0/2) 2  Language : Fluency (0/1) 1  Abstraction (0/2) 2  Delayed Recall (0/5) 3  Orientation (0/6) 6  Total 27     MS History:    In mid March 2018, he had the onset of blurry vision out of the left eye. He noted that the visual field was worse on the left side than the right side. Additionally he was unable to read out of the left eye. Colors were mildly desaturated. On April 2, he had the onset of left facial numbness. Over the next day the numbness increased to involve the entire left side, worse in the arm than the leg. He also noted mild weakness in the left arm and reduced balance and gait.[name] He presented to [facility] and an MRI 07/02/2016 showed many T2/FLAIR hyperintense foci, four foci  enhanced  (right frontal subcortical white matter, right corpus callosum, left parietal subcortical white matter and left mesial temporal lobe), consistent with the diagnosis of MS. He was admitted and received 3 days of IV Solu-Medrol. His symptoms improved  some and he was discharged.  He was referred to me and Gilford Raid was started in March 2018.   Due to an exacerbation 04/2017, he switched to Tysabri.    He had an exacerbation 05/2018 and he switched to [facility] 07/2018.    IMAGING: MRI 07/02/2016 showed many T2/FLAIR hyperintense foci, four foci  enhanced  (right frontal subcortical white matter, right corpus callosum, left parietal subcortical white matter and left mesial temporal lobe),   MRI brain 05/13/2018 showed Multiple T2/flair hyperintense foci in the hemispheres, cerebellum and brainstem in a pattern and configuration consistent with chronic demyelinating plaque associated with multiple sclerosis.  None of the foci appears to be acute.  Compared to the MRI  dated 03/28/2018, there is no interval change.   There are no acute findings and a normal enhancement pattern.  MRI cervical spine 05/13/2018 showed no demyelinating plaques in the spinal coed.    C5-C7 fusion with metal hardware at C6-C7.     Mild multilevel degenerative changes as detailed above that do not lead to nerve root compression or spinal cord stenosis.  Unchanged from 2018 MRI.    MRI Brain 03/28/2018 T2/flair hyperintense foci in the left middle cerebellar peduncle, left pons and right cerebellar hemisphere.  None of these appear to be acute and they were all present on the previous MRI.  Additionally, there are T2/flair hyperintense foci in the juxtacortical, periventricular and deep white matter of both hemispheres.  None of these appear to be acute.  Some of the periventricular foci are radially oriented to the ventricles.  They were all present on the MRI from 07/02/2016. [name]  MRI cervical spine 03/28/2018 showed a normal spinal coed.    chronic C5-C7 fusion with metal hardware at C6-C7.     Mild multilevel mild degenerative changes without nerve root compression ir spinal compression    REVIEW OF SYSTEMS: Constitutional: No fevers, chills, sweats, or change in appetite.   Notes fatigue. Eyes: No visual changes, double vision, eye pain Ear, nose and throat: No hearing loss, ear pain, nasal congestion, sore throat Cardiovascular: No chest pain, palpitations Respiratory:  No shortness of breath at rest or with exertion.   No wheezes GastrointestinaI: No nausea, vomiting, diarrhea, abdominal pain, fecal incontinence Genitourinary:  No dysuria, urinary retention or frequency.  No nocturia. Musculoskeletal:  No neck pain, back pain Integumentary: No rash, pruritus, skin lesions Neurological: as above Psychiatric: No depression at this time.  Mild anxiety Endocrine: No palpitations, diaphoresis, change in appetite, change in weigh or increased thirst Hematologic/Lymphatic:  No anemia,  purpura, petechiae. Allergic/Immunologic: No itchy/runny eyes, nasal congestion, recent allergic reactions, rashes  ALLERGIES: No Known Allergies  HOME MEDICATIONS:  Current Outpatient Medications:    buPROPion (WELLBUTRIN XL) 150 MG 24 hr tablet, Take 1 tablet (150 mg total) by mouth daily., Disp: 90 tablet, Rfl: 3   methylphenidate (CONCERTA) 54 MG PO CR tablet, Take 1 tablet (54 mg total) by mouth every morning., Disp: 30 tablet, Rfl: 0   metoprolol tartrate (LOPRESSOR) 25 MG tablet, TAKE 1 TABLET BY MOUTH TWICE DAILY, Disp: 60 tablet, Rfl: 1   ocrelizumab (OCREVUS) 300 MG/10ML injection, Inject into the vein once., Disp: , Rfl:    omeprazole (PRILOSEC) 40 MG capsule, TAKE 1 CAPSULE(40 MG) BY MOUTH DAILY, Disp: 30 capsule, Rfl: 3 No current facility-administered medications for this visit.  Facility-Administered Medications Ordered in Other Visits:    gadopentetate dimeglumine (MAGNEVIST) injection 20 mL, 20 mL, Intravenous, Once PRN, Ijeoma Loor, Pearletha Furl, MD  PAST MEDICAL HISTORY: Past Medical History:  Diagnosis Date   Atrial fibrillation (HCC)    Hypertension    MS (multiple sclerosis) (HCC)    Vision abnormalities     PAST SURGICAL HISTORY: Past Surgical History:  Procedure Laterality Date   CERVICAL FUSION  2005, 2014    FAMILY HISTORY: Family History  Problem Relation Age of Onset   Lupus Mother    Healthy Father    Lymphoma Maternal Grandfather    Colon cancer Paternal Grandmother    Alcohol abuse Paternal Uncle    Alcohol abuse Paternal Grandfather    Kidney cancer Neg Hx    Bladder Cancer Neg Hx    Prostate cancer Neg Hx     SOCIAL HISTORY:  Social History   Socioeconomic History   Marital status: Married    Spouse name: teresa   Number of children: 3   Years of education: Not on file   Highest education level: High school graduate  Occupational History   Not on file  Tobacco Use   Smoking status: Former    Types: Cigarettes    Quit date: 2003     Years since quitting: 19.5   Smokeless tobacco: Never  Vaping Use   Vaping Use: Never used  Substance and Sexual Activity   Alcohol use: Yes    Comment: social   Drug use: No   Sexual activity: Yes  Other Topics Concern   Not on file  Social History Narrative   Not on file   Social Determinants of Health   Financial Resource Strain: Not on file  Food Insecurity: Not on file  Transportation Needs: Not on file  Physical Activity: Not on file  Stress: Not on file  Social Connections: Not on file  Intimate Partner Violence: Not on file     PHYSICAL EXAM  Vitals:   10/19/20 1414  BP: (!) 139/97  Pulse: 86  Weight: 261 lb (118.4 kg)  Height: 6\' 1"  (1.854 m)    Body mass index is 34.43 kg/m.   General: The patient is well-developed and well-nourished and in no acute distress.     Neurologic Exam  Mental status:    The patient is alert and oriented x 3 at the time of the examination. The patient has apparent normal recent and remote memory, with an apparently normal attention span and concentration ability.   Speech is normal.  Cranial nerves: Extraocular movements are full.    Color vision is symmetric.  Facial strength is normal.  Trapezius and sternocleidomastoid strength is normal. No dysarthria is noted.  No obvious hearing deficits are noted.  Motor:  Muscle bulk is normal.   Tone is normal. Strength is 5/5 on the right but 4++/5 in biceps, APB and FDIO but 5/5 elsewhere.    Sensory: Sensory testing is altered in the palm and dorsum adjacent to thee second and third fingers..  leg sensation was symmetric  Coordination: Cerebellar testing shows good finger nose finger and mildly reduced left heel-to-shin.  Gait and station: Station is normal.  Gait is normal. Tandem gait was slightly wide.  This is improved.  Reflexes: Deep tendon reflexes are symmetric and 3 at knees and 2 elsewhere.  No ankle clonus.       DIAGNOSTIC DATA (LABS, IMAGING, TESTING) - I  reviewed patient records, labs, notes, testing and imaging myself where available.  Lab Results  Component Value Date   WBC 5.5 04/27/2020   HGB 15.4 04/27/2020   HCT 44.6  04/27/2020   MCV 87 04/27/2020   PLT 303 04/27/2020      Component Value Date/Time   NA 145 (H) 12/29/2018 1121   K 4.0 12/29/2018 1121   CL 105 12/29/2018 1121   CO2 24 12/29/2018 1121   GLUCOSE 143 (H) 12/29/2018 1121   GLUCOSE 125 (H) 09/24/2018 0428   BUN 9 12/29/2018 1121   CREATININE 0.92 12/29/2018 1121   CALCIUM 9.9 12/29/2018 1121   PROT 6.4 12/29/2018 1121   ALBUMIN 4.8 12/29/2018 1121   AST 15 12/29/2018 1121   ALT 17 12/29/2018 1121   ALKPHOS 60 12/29/2018 1121   BILITOT 0.3 12/29/2018 1121   GFRNONAA 103 12/29/2018 1121   GFRAA 119 12/29/2018 1121   _________________________  Multiple sclerosis (HCC) - Plan: MR BRAIN W WO CONTRAST, IgG, IgA, IgM, CBC with Differential/Platelet, Comprehensive metabolic panel  Balance problem - Plan: MR BRAIN W WO CONTRAST, IgG, IgA, IgM, CBC with Differential/Platelet, Comprehensive metabolic panel  Vitamin D deficiency - Plan: VITAMIN D 25 Hydroxy (Vit-D Deficiency, Fractures)  Numbness and tingling in right hand  OSA (obstructive sleep apnea)   1.   Continue Ocrevus.   Check labs (IgG was low in past).   Check MRI  brainto determine if signficiant progression and consider a change in therapy if this is occurring. 2.   Change Adderall to Concerta 54 mg for ADD and fatigue .   He had a normal ognitive evaluation 3.   Stay active and exercise as tolerated. 4.   He will return to see [place] in 6 months or sooner if there are new or worsening neurologic symptoms.

## 2020-10-20 LAB — IGG, IGA, IGM
IgA/Immunoglobulin A, Serum: 111 mg/dL (ref 90–386)
IgG (Immunoglobin G), Serum: 498 mg/dL — ABNORMAL LOW (ref 603–1613)
IgM (Immunoglobulin M), Srm: 18 mg/dL — ABNORMAL LOW (ref 20–172)

## 2020-10-20 LAB — COMPREHENSIVE METABOLIC PANEL
ALT: 12 IU/L (ref 0–44)
AST: 12 IU/L (ref 0–40)
Albumin/Globulin Ratio: 3.2 — ABNORMAL HIGH (ref 1.2–2.2)
Albumin: 4.8 g/dL (ref 4.0–5.0)
Alkaline Phosphatase: 65 IU/L (ref 44–121)
BUN/Creatinine Ratio: 9 (ref 9–20)
BUN: 9 mg/dL (ref 6–24)
Bilirubin Total: 0.4 mg/dL (ref 0.0–1.2)
CO2: 25 mmol/L (ref 20–29)
Calcium: 9.6 mg/dL (ref 8.7–10.2)
Chloride: 103 mmol/L (ref 96–106)
Creatinine, Ser: 1 mg/dL (ref 0.76–1.27)
Globulin, Total: 1.5 g/dL (ref 1.5–4.5)
Glucose: 72 mg/dL (ref 65–99)
Potassium: 4.5 mmol/L (ref 3.5–5.2)
Sodium: 143 mmol/L (ref 134–144)
Total Protein: 6.3 g/dL (ref 6.0–8.5)
eGFR: 96 mL/min/{1.73_m2} (ref >59)

## 2020-10-20 LAB — CBC WITH DIFFERENTIAL/PLATELET
Basophils Absolute: 0 10*3/uL (ref 0.0–0.2)
Basos: 0 %
EOS (ABSOLUTE): 0.2 10*3/uL (ref 0.0–0.4)
Eos: 4 %
Hematocrit: 44.9 % (ref 37.5–51.0)
Hemoglobin: 15.5 g/dL (ref 13.0–17.7)
Immature Grans (Abs): 0 10*3/uL (ref 0.0–0.1)
Immature Granulocytes: 0 %
Lymphocytes Absolute: 1.8 10*3/uL (ref 0.7–3.1)
Lymphs: 27 %
MCH: 30.6 pg (ref 26.6–33.0)
MCHC: 34.5 g/dL (ref 31.5–35.7)
MCV: 89 fL (ref 79–97)
Monocytes Absolute: 0.6 10*3/uL (ref 0.1–0.9)
Monocytes: 9 %
Neutrophils Absolute: 4.2 10*3/uL (ref 1.4–7.0)
Neutrophils: 60 %
Platelets: 290 10*3/uL (ref 150–450)
RBC: 5.06 x10E6/uL (ref 4.14–5.80)
RDW: 13.3 % (ref 11.6–15.4)
WBC: 6.9 10*3/uL (ref 3.4–10.8)

## 2020-10-20 LAB — VITAMIN D 25 HYDROXY (VIT D DEFICIENCY, FRACTURES): Vit D, 25-Hydroxy: 21.1 ng/mL — ABNORMAL LOW (ref 30.0–100.0)

## 2020-10-23 ENCOUNTER — Encounter: Payer: Self-pay | Admitting: *Deleted

## 2020-10-23 ENCOUNTER — Telehealth: Payer: Self-pay | Admitting: Neurology

## 2020-10-23 NOTE — Telephone Encounter (Signed)
MRI brain w/wo contrast [place] Numbers: [id number] (10/23/20-11/21/20)  Sent to GI for scheduling

## 2020-11-01 ENCOUNTER — Ambulatory Visit: Payer: BC Managed Care – PPO | Admitting: Neurology

## 2020-11-02 ENCOUNTER — Ambulatory Visit
Admission: RE | Admit: 2020-11-02 | Discharge: 2020-11-02 | Disposition: A | Discharge status: Home / Self Care | Payer: BC Managed Care – PPO | Source: Ambulatory Visit | Attending: Neurology | Admitting: Neurology

## 2020-11-02 DIAGNOSIS — R2689 Other abnormalities of gait and mobility: Secondary | ICD-10-CM

## 2020-11-02 DIAGNOSIS — G35 Multiple sclerosis: Secondary | ICD-10-CM

## 2020-11-02 MED ORDER — GADOBUTROL 1 MMOL/ML IV SOLN
20.0000 mL | Freq: Once | INTRAVENOUS | Status: AC | PRN
  Administered 2020-11-02: 20 mL via INTRAVENOUS

## 2020-11-24 ENCOUNTER — Other Ambulatory Visit: Payer: Self-pay

## 2020-11-24 MED ORDER — METHYLPHENIDATE HCL ER (OSM) 54 MG PO TBCR: 54.0000 mg | EXTENDED_RELEASE_TABLET | ORAL | 0 refills | Status: DC

## 2021-01-02 ENCOUNTER — Other Ambulatory Visit: Payer: Self-pay | Admitting: Neurology

## 2021-01-03 MED ORDER — METHYLPHENIDATE HCL ER (OSM) 54 MG PO TBCR: 54.0000 mg | EXTENDED_RELEASE_TABLET | ORAL | 0 refills | Status: DC

## 2021-01-03 NOTE — Telephone Encounter (Signed)
Received refill request for Concerta.  Last OV was on 10/19/20.  Next OV is scheduled for 04/26/21 .  Last RX was written on 11/24/20 for 30 tabs.   [place] [facility] has been reviewed.

## 2021-01-25 ENCOUNTER — Telehealth: Payer: Self-pay | Admitting: Neurology

## 2021-01-25 NOTE — Telephone Encounter (Signed)
[place] Nurse Case Manager from [employer] called needing to speak to the RN regarding the pt's plan of care. Please advise. [phone] extension [phone]

## 2021-01-25 NOTE — Telephone Encounter (Signed)
Called Sean Espinoza back. There was no answer. LVM for the CM. Unsure what they mean about updating plan of care. Lvm for a call back.

## 2021-01-28 ENCOUNTER — Other Ambulatory Visit: Payer: Self-pay | Admitting: Neurology

## 2021-01-30 NOTE — Telephone Encounter (Signed)
The case manager has returned the call for the patient.  She just wants to ensure the patient knows that he has a lot of available resources.  Advised the patient has a scheduled follow-up in January.  She wanted to make sure that her contact information was on the chart that way we can encourage the patient to call them back.  Advised I would do so.

## 2021-02-01 ENCOUNTER — Telehealth: Payer: Self-pay | Admitting: Neurology

## 2021-02-01 NOTE — Telephone Encounter (Signed)
Sean Espinoza with [facility] called stating pt's OCREVUS 300 MG/10ML injection does need a PA. Case ID#: [id number].

## 2021-02-01 NOTE — Telephone Encounter (Signed)
Our infusion suite has already received the paperwork and its in process.

## 2021-02-05 ENCOUNTER — Other Ambulatory Visit: Payer: Self-pay

## 2021-02-06 MED ORDER — METHYLPHENIDATE HCL ER (OSM) 54 MG PO TBCR: 54.0000 mg | EXTENDED_RELEASE_TABLET | ORAL | 0 refills | Status: DC

## 2021-03-15 ENCOUNTER — Encounter: Payer: Self-pay | Admitting: Neurology

## 2021-03-15 ENCOUNTER — Other Ambulatory Visit: Payer: Self-pay | Admitting: *Deleted

## 2021-03-15 DIAGNOSIS — F32A Depression, unspecified: Secondary | ICD-10-CM

## 2021-03-15 DIAGNOSIS — G35 Multiple sclerosis: Secondary | ICD-10-CM

## 2021-03-15 DIAGNOSIS — R4586 Emotional lability: Secondary | ICD-10-CM

## 2021-04-02 ENCOUNTER — Other Ambulatory Visit: Payer: Self-pay | Admitting: Neurology

## 2021-04-03 MED ORDER — METHYLPHENIDATE HCL ER (OSM) 54 MG PO TBCR: 54.0000 mg | EXTENDED_RELEASE_TABLET | ORAL | 0 refills | Status: DC

## 2021-04-03 NOTE — Telephone Encounter (Signed)
Received refill request for Concerta.  Last OV was on 10/19/20.  Next OV is scheduled for 04/26/21 .  Last RX was written on 02/06/21 for 30 tabs.   [place] [facility] Database has been reviewed.

## 2021-04-07 ENCOUNTER — Telehealth: Payer: Self-pay

## 2021-04-07 NOTE — Telephone Encounter (Signed)
recevied referral on this patient would you be willing to see pt again.

## 2021-04-09 NOTE — Telephone Encounter (Signed)
Yes , but please discuss clinic policy regarding compliance, no show policy  Please schedule an IN PERSON visit - needs atleast 40 minutes thanks . Please ask him to come early to office for the appointment.

## 2021-04-11 ENCOUNTER — Telehealth: Payer: Self-pay | Admitting: *Deleted

## 2021-04-11 NOTE — Telephone Encounter (Signed)
Took call from phone staff and spoke w/ [facility]. Confirmed pt receiving Ocrevus 600mg  IV q 8 months currently d/t low IgG and IgM levels. I placed on hold to speak with infusion suite to confirm next infusion. Relayed to [facility] that pt next infusion is 05/03/21. They verbalized  understanding, nothing further needed.

## 2021-04-12 NOTE — Telephone Encounter (Signed)
Apt made- informed him of the following.  Nothing Further Needed at this time.

## 2021-04-26 ENCOUNTER — Ambulatory Visit: Payer: BC Managed Care – PPO | Admitting: Family Medicine

## 2021-05-02 ENCOUNTER — Other Ambulatory Visit: Payer: Self-pay | Admitting: Neurology

## 2021-05-02 ENCOUNTER — Encounter: Payer: Self-pay | Admitting: Neurology

## 2021-05-10 ENCOUNTER — Encounter: Payer: Self-pay | Admitting: Adult Health

## 2021-05-10 ENCOUNTER — Ambulatory Visit (INDEPENDENT_AMBULATORY_CARE_PROVIDER_SITE_OTHER): Payer: BC Managed Care – PPO | Admitting: Adult Health

## 2021-05-10 VITALS — BP 164/101 | HR 84 | Ht 73.0 in | Wt 258.0 lb

## 2021-05-10 DIAGNOSIS — G4719 Other hypersomnia: Secondary | ICD-10-CM

## 2021-05-10 DIAGNOSIS — F988 Other specified behavioral and emotional disorders with onset usually occurring in childhood and adolescence: Secondary | ICD-10-CM | POA: Diagnosis not present

## 2021-05-10 DIAGNOSIS — G35 Multiple sclerosis: Secondary | ICD-10-CM

## 2021-05-10 DIAGNOSIS — Z5181 Encounter for therapeutic drug level monitoring: Secondary | ICD-10-CM

## 2021-05-10 DIAGNOSIS — G44219 Episodic tension-type headache, not intractable: Secondary | ICD-10-CM | POA: Diagnosis not present

## 2021-05-10 MED ORDER — AMPHETAMINE-DEXTROAMPHETAMINE 20 MG PO TABS: ORAL_TABLET | ORAL | 0 refills | Status: DC

## 2021-05-10 NOTE — Progress Notes (Signed)
[facility]  PATIENT: Sean Espinoza DOB: 01-07-1976   Primary neurologist: Dr. Sherol Dade  REFERRING DOCTOR OR PCP:  Referred by Dr. Thad Ranger   SOURCE: paitent, ED notes, imaging reports, lab reports, MRI images on PACS  _________________________________   HISTORICAL  CHIEF COMPLAINT:  Chief Complaint  Patient presents with   Follow-up    RM 3 alone Pt is well, has been very fatigued but overall stable.     HISTORY OF PgRESENT ILLNESS:  Sean Espinoza is a 46 y.o. man  diagnosed with relapsing remitting multiple sclerosis in early 2018.     Update 05/10/2021 JM: Returns for MS follow-up visit after prior visit with Dr. Epimenio Foot 7 months ago. Repeat labs showed further drop of IgG at 498 (prior 553) and low IgM at 18 therefore currently receiving Ocrevus 600 mg infusion every 8 months.  Last infusion 05/03/2021.  Tolerates infusion well and denies any exacerbation or new neurological symptoms.  Continued mild imbalance, stable. Denies any weakness or numbness/tinging. Bowel and bladder good. Denies visual issues or changes.   Does c/o fatigue. Was switched from Adderall to [facility] after prior visit - has been experiencing headaches since starting Concentra and excessive fatigue. Headache present band type pressure sensation around head, present almost daily, occasional sensitivity to light.  Denies photophobia or N/V. Will use Tylenol or Advil with benefit but will quickly return.    Imaging: Repeat MR brain 10/2020 showed T2/FLAIR hyperintense foci in the hemispheres, brainstem and cerebellum consistent with prior imaging. No new finding or lesions compared to prior imaging in 05/2018.       History provided for reference purposes only Update 10/19/2020 Dr. Epimenio Foot: He is on Ocrevus 600 mg every 6 months for MS.  Last  infusion is 09/28/20.   He has tolerated it well and denies any exacerbation or new neurologic symptoms.   IgG has been mildly low (553 last  check; IgM normal)  Gait is ok with mildly reduced balance.   He can go downstairs without using the bannister  He has mild left hand weakness and sometimes has some cramping in the thumb and index fingers.  Leg cramps have resolved.   He had cervical spine fusion x 2 (C5-C7 now)        Bladder is doing well.   He denies any difficulty with vision.  He has some fatigue and decreased focus/attention.   He takes Adderall at 700, 1200 and 1700 most days. But notes it seems to wear off between doses.    He is sleeping better than last year.   Mood is doing well on Wellbutrin.     Home sleep study 2020 that showed mild OSA with an AHI equals 12.8.  He has since lost 35 pouds so prefers not to do CPAP .  His wife noted that he is snoring less  NCV/EMG 2021 of left arm showed no significant findings though a mild right C7 chronic radiculopathy cannot be ruled out.  He works for [employer] and does bending and  lifting but weight is usually not high.   For work he needs a Retail banker.  Montreal Cognitive Assessment  10/19/2020  Visuospatial/ Executive (0/5) 4  Naming (0/3) 3  Attention: Read list of digits (0/2) 2  Attention: Read list of letters (0/1) 1  Attention: Serial 7 subtraction starting at 100 (0/3) 3  Language: Repeat phrase (0/2) 2  Language : Fluency (0/1) 1  Abstraction (0/2) 2  Delayed Recall (0/5) 3  Orientation (0/6) 6  Total 27     MS History:    In mid March 2018, he had the onset of blurry vision out of the left eye. He noted that the visual field was worse on the left side than the right side. Additionally he was unable to read out of the left eye. Colors were mildly desaturated. On April 2, he had the onset of left facial numbness. Over the next day the numbness increased to involve the entire left side, worse in the arm than the leg. He also noted mild weakness in the left arm and reduced balance and gait.[name] He presented to [facility] and an MRI 07/02/2016 showed  many T2/FLAIR hyperintense foci, four foci  enhanced  (right frontal subcortical white matter, right corpus callosum, left parietal subcortical white matter and left mesial temporal lobe), consistent with the diagnosis of MS. He was admitted and received 3 days of IV Solu-Medrol. His symptoms improved some and he was discharged.  He was referred to me and Gilford Raid was started in March 2018.   Due to an exacerbation 04/2017, he switched to Tysabri.    He had an exacerbation 05/2018 and he switched to [facility] 07/2018.    IMAGING: MRI 07/02/2016 showed many T2/FLAIR hyperintense foci, four foci  enhanced  (right frontal subcortical white matter, right corpus callosum, left parietal subcortical white matter and left mesial temporal lobe),   MRI brain 05/13/2018 showed Multiple T2/flair hyperintense foci in the hemispheres, cerebellum and brainstem in a pattern and configuration consistent with chronic demyelinating plaque associated with multiple sclerosis.  None of the foci appears to be acute.  Compared to the MRI dated 03/28/2018, there is no interval change.   There are no acute findings and a normal enhancement pattern.  MRI cervical spine 05/13/2018 showed no demyelinating plaques in the spinal coed.    C5-C7 fusion with metal hardware at C6-C7.     Mild multilevel degenerative changes as detailed above that do not lead to nerve root compression or spinal cord stenosis.  Unchanged from 2018 MRI.    MRI Brain 03/28/2018 T2/flair hyperintense foci in the left middle cerebellar peduncle, left pons and right cerebellar hemisphere.  None of these appear to be acute and they were all present on the previous MRI.  Additionally, there are T2/flair hyperintense foci in the juxtacortical, periventricular and deep white matter of both hemispheres.  None of these appear to be acute.  Some of the periventricular foci are radially oriented to the ventricles.  They were all present on the MRI from 07/02/2016. [name]  MRI cervical  spine 03/28/2018 showed a normal spinal coed.    chronic C5-C7 fusion with metal hardware at C6-C7.     Mild multilevel mild degenerative changes without nerve root compression ir spinal compression    REVIEW OF SYSTEMS: Constitutional: No fevers, chills, sweats, or change in appetite.   Notes fatigue. Eyes: No visual changes, double vision, eye pain Ear, nose and throat: No hearing loss, ear pain, nasal congestion, sore throat Cardiovascular: No chest pain, palpitations Respiratory:  No shortness of breath at rest or with exertion.   No wheezes GastrointestinaI: No nausea, vomiting, diarrhea, abdominal pain, fecal incontinence Genitourinary:  No dysuria, urinary retention or frequency.  No nocturia. Musculoskeletal:  No neck pain, back pain Integumentary: No rash, pruritus, skin lesions Neurological: as above Psychiatric: No depression at this time.  Mild anxiety Endocrine: No palpitations, diaphoresis, change in appetite, change in weigh or increased thirst Hematologic/Lymphatic:  No anemia, purpura, petechiae. Allergic/Immunologic: No itchy/runny eyes, nasal congestion, recent allergic reactions, rashes  ALLERGIES: No Known Allergies  HOME MEDICATIONS:  Current Outpatient Medications:    amphetamine-dextroamphetamine (ADDERALL) 20 MG tablet, Take 1 tablet in the morning and 1 around noon. Do not take after 3 pm., Disp: 60 tablet, Rfl: 0   buPROPion (WELLBUTRIN XL) 150 MG 24 hr tablet, TAKE 1 TABLET(150 MG) BY MOUTH DAILY, Disp: 90 tablet, Rfl: 0   metoprolol tartrate (LOPRESSOR) 25 MG tablet, TAKE 1 TABLET BY MOUTH TWICE DAILY, Disp: 60 tablet, Rfl: 1   OCREVUS 300 MG/10ML injection, INFUSE 600 MG INTRAVENOUSLY EVERY 6 MONTHS (Patient taking differently: Every 8 mo), Disp: 20 mL, Rfl: 1   omeprazole (PRILOSEC) 40 MG capsule, Take 1 capsule (40 mg total) by mouth daily., Disp: 90 capsule, Rfl: 0   VITAMIN D PO, Take 5,000 Units by mouth daily., Disp: , Rfl:  No current  facility-administered medications for this visit.  Facility-Administered Medications Ordered in Other Visits:    gadopentetate dimeglumine (MAGNEVIST) injection 20 mL, 20 mL, Intravenous, Once PRN, Sater, Pearletha Furl, MD  PAST MEDICAL HISTORY: Past Medical History:  Diagnosis Date   Atrial fibrillation (HCC)    Hypertension    MS (multiple sclerosis) (HCC)    Vision abnormalities     PAST SURGICAL HISTORY: Past Surgical History:  Procedure Laterality Date   CERVICAL FUSION  2005, 2014    FAMILY HISTORY: Family History  Problem Relation Age of Onset   Lupus Mother    Healthy Father    Lymphoma Maternal Grandfather    Colon cancer Paternal Grandmother    Alcohol abuse Paternal Uncle    Alcohol abuse Paternal Grandfather    Kidney cancer Neg Hx    Bladder Cancer Neg Hx    Prostate cancer Neg Hx     SOCIAL HISTORY:  Social History   Socioeconomic History   Marital status: Married    Spouse name: teresa   Number of children: 3   Years of education: Not on file   Highest education level: High school graduate  Occupational History   Not on file  Tobacco Use   Smoking status: Former    Types: Cigarettes    Quit date: 2003    Years since quitting: 20.1   Smokeless tobacco: Never  Vaping Use   Vaping Use: Never used  Substance and Sexual Activity   Alcohol use: Yes    Comment: social   Drug use: No   Sexual activity: Yes  Other Topics Concern   Not on file  Social History Narrative   Not on file   Social Determinants of Health   Financial Resource Strain: Not on file  Food Insecurity: Not on file  Transportation Needs: Not on file  Physical Activity: Not on file  Stress: Not on file  Social Connections: Not on file  Intimate Partner Violence: Not on file     PHYSICAL EXAM  Vitals:   05/10/21 1059  BP: (!) 164/101  Pulse: 84  Weight: 258 lb (117 kg)  Height: 6\' 1"  (1.854 m)   Body mass index is 34.04 kg/m.   General: The patient is  well-developed and well-nourished and in no acute distress.     Neurologic Exam  Mental status:    The patient is alert and oriented x 3 at the time of the examination. The patient has apparent normal recent and remote memory, with an apparently normal attention span and concentration ability.   Speech  is normal.  Cranial nerves: Extraocular movements are full.    Color vision is symmetric.  Facial strength is normal.  Trapezius and sternocleidomastoid strength is normal. No dysarthria is noted.  No obvious hearing deficits are noted.  Motor:  Muscle bulk is normal.   Tone is normal. Strength is 5/5 throughout all extremities except slight decreased left hand dexterity, chronic.   Sensory: Sensory testing is altered in the palm and dorsum adjacent to thee second and third fingers..  leg sensation was symmetric  Coordination: Cerebellar testing shows good finger nose finger and mildly reduced left heel-to-shin.  Gait and station: Station is normal.  Gait is normal. Tandem gait was slightly wide.  This is improved.  Reflexes: Deep tendon reflexes are symmetric and 3 at knees and 2 elsewhere.  No ankle clonus.           DIAGNOSTIC DATA (LABS, IMAGING, TESTING) - I reviewed patient records, labs, notes, testing and imaging myself where available.  MR BRAIN W/WO CONTRAST 11/02/2020 IMPRESSION: This MRI of the brain with and without contrast shows the following: 1.   Multiple T2/FLAIR hyperintense foci in the hemispheres, brainstem and cerebellum in a pattern and configuration consistent with chronic demyelinating plaque associated with multiple sclerosis.  None of the foci enhance or appear to be acute.  Compared to the MRI dated 05/13/2018, there are no new lesions. 2.   No acute findings.  Normal enhancement pattern.  05/13/2018 IMPRESSION: This MRI of the brain with and without contrast shows the following: 1.    Multiple T2/flair hyperintense foci in the hemispheres, cerebellum and  brainstem in a pattern and configuration consistent with chronic demyelinating plaque associated with multiple sclerosis.  None of the foci appears to be acute.  Compared to the MRI dated 03/28/2018, there is no interval change. 2.    There are no acute findings and a normal enhancement pattern.    Lab Results  Component Value Date   WBC 6.9 10/19/2020   HGB 15.5 10/19/2020   HCT 44.9 10/19/2020   MCV 89 10/19/2020   PLT 290 10/19/2020      Component Value Date/Time   NA 143 10/19/2020 1456   K 4.5 10/19/2020 1456   CL 103 10/19/2020 1456   CO2 25 10/19/2020 1456   GLUCOSE 72 10/19/2020 1456   GLUCOSE 125 (H) 09/24/2018 0428   BUN 9 10/19/2020 1456   CREATININE 1.00 10/19/2020 1456   CALCIUM 9.6 10/19/2020 1456   PROT 6.3 10/19/2020 1456   ALBUMIN 4.8 10/19/2020 1456   AST 12 10/19/2020 1456   ALT 12 10/19/2020 1456   ALKPHOS 65 10/19/2020 1456   BILITOT 0.4 10/19/2020 1456   GFRNONAA 103 12/29/2018 1121   GFRAA 119 12/29/2018 1121   _________________________  ASSESSMENT/PLAN: PHILLIPS GRIM is a 46 y.o. male with    Multiple sclerosis (HCC) - Plan: Vitamin D, 25-hydroxy, CMP, CBC with Differential/Platelets, IgG, IgA, IgM  Attention deficit disorder (ADD) in adult  Excessive daytime sleepiness  Headache, frequent episodic tension-type  Therapeutic drug monitoring - Plan: Vitamin D, 25-hydroxy, CMP, CBC with Differential/Platelets, IgG, IgA, IgM   1.   Continue Ocrevus currently every 8 months due to low IgG and IgM. Recent infusion 05/03/2021. Repeat labs today.   MR brain w/wo 10/2020 stable without new lesions or concerning findings compared to prior MRI 05/2020 2.   Change Concerta back to Adderall for ADD and fatigue. concern tension type headaches side effect of Concerta as onset at same  time of starting and no benefit in regards to fatigue 3.   Stay active and exercise as tolerated.    Follow up with Dr. Epimenio Foot in 6 months or call earlier if needed      CC:  [facility] provider: Dr. Epimenio Foot   I spent 26 minutes of face-to-face and non-face-to-face time with patient.  This included previsit chart review, lab review, study review, order entry, electronic health record documentation, patient education and discussion regarding above diagnoses  Ihor Austin, [facility]  [facility] [address] [place], [place] [address]  Phone [phone] Fax [phone] Note: This document was prepared with digital dictation and possible smart phrase technology. Any transcriptional errors that result from this process are unintentional.

## 2021-05-10 NOTE — Patient Instructions (Signed)
Your Plan:  Stop Concerta as this can be contributing to your headaches - recommend restart Adderall at prior dosage.  Check lab work today    Follow up in 6 months with Dr. Epimenio Foot or call earlier if needed     Thank you for coming to see [place] at [facility]. I hope we have been able to provide you high quality care today.  You may receive a patient satisfaction survey over the next few weeks. We would appreciate your feedback and comments so that we may continue to improve ourselves and the health of our patients.

## 2021-05-11 ENCOUNTER — Ambulatory Visit: Payer: Self-pay | Admitting: Psychiatry

## 2021-05-11 LAB — CBC WITH DIFFERENTIAL/PLATELET
Basophils Absolute: 0 10*3/uL (ref 0.0–0.2)
Basos: 1 %
EOS (ABSOLUTE): 0.3 10*3/uL (ref 0.0–0.4)
Eos: 4 %
Hematocrit: 48.1 % (ref 37.5–51.0)
Hemoglobin: 16.2 g/dL (ref 13.0–17.7)
Immature Grans (Abs): 0 10*3/uL (ref 0.0–0.1)
Immature Granulocytes: 1 %
Lymphocytes Absolute: 2.1 10*3/uL (ref 0.7–3.1)
Lymphs: 33 %
MCH: 29.7 pg (ref 26.6–33.0)
MCHC: 33.7 g/dL (ref 31.5–35.7)
MCV: 88 fL (ref 79–97)
Monocytes Absolute: 0.6 10*3/uL (ref 0.1–0.9)
Monocytes: 9 %
Neutrophils Absolute: 3.4 10*3/uL (ref 1.4–7.0)
Neutrophils: 52 %
Platelets: 283 10*3/uL (ref 150–450)
RBC: 5.45 x10E6/uL (ref 4.14–5.80)
RDW: 13.1 % (ref 11.6–15.4)
WBC: 6.5 10*3/uL (ref 3.4–10.8)

## 2021-05-11 LAB — COMPREHENSIVE METABOLIC PANEL
ALT: 12 IU/L (ref 0–44)
AST: 8 IU/L (ref 0–40)
Albumin/Globulin Ratio: 2.3 — ABNORMAL HIGH (ref 1.2–2.2)
Albumin: 4.9 g/dL (ref 4.0–5.0)
Alkaline Phosphatase: 61 IU/L (ref 44–121)
BUN/Creatinine Ratio: 16 (ref 9–20)
BUN: 16 mg/dL (ref 6–24)
Bilirubin Total: 0.3 mg/dL (ref 0.0–1.2)
CO2: 25 mmol/L (ref 20–29)
Calcium: 9.9 mg/dL (ref 8.7–10.2)
Chloride: 103 mmol/L (ref 96–106)
Creatinine, Ser: 0.99 mg/dL (ref 0.76–1.27)
Globulin, Total: 2.1 g/dL (ref 1.5–4.5)
Glucose: 76 mg/dL (ref 70–99)
Potassium: 4.7 mmol/L (ref 3.5–5.2)
Sodium: 144 mmol/L (ref 134–144)
Total Protein: 7 g/dL (ref 6.0–8.5)
eGFR: 97 mL/min/{1.73_m2} (ref >59)

## 2021-05-11 LAB — IGG, IGA, IGM
IgA/Immunoglobulin A, Serum: 107 mg/dL (ref 90–386)
IgG (Immunoglobin G), Serum: 513 mg/dL — ABNORMAL LOW (ref 603–1613)
IgM (Immunoglobulin M), Srm: 26 mg/dL (ref 20–172)

## 2021-05-11 LAB — VITAMIN D 25 HYDROXY (VIT D DEFICIENCY, FRACTURES): Vit D, 25-Hydroxy: 19.5 ng/mL — ABNORMAL LOW (ref 30.0–100.0)

## 2021-05-24 ENCOUNTER — Ambulatory Visit: Payer: Self-pay | Admitting: Psychiatry

## 2021-06-14 ENCOUNTER — Ambulatory Visit: Payer: BC Managed Care – PPO | Admitting: Adult Health

## 2021-07-19 ENCOUNTER — Other Ambulatory Visit: Payer: Self-pay | Admitting: Adult Health

## 2021-07-19 MED ORDER — AMPHETAMINE-DEXTROAMPHETAMINE 20 MG PO TABS: ORAL_TABLET | ORAL | 0 refills | Status: DC

## 2021-07-19 NOTE — Telephone Encounter (Signed)
Pt request refill for amphetamine-dextroamphetamine (ADDERALL) 20 MG tablet at [id number] 

## 2021-07-30 ENCOUNTER — Ambulatory Visit (INDEPENDENT_AMBULATORY_CARE_PROVIDER_SITE_OTHER): Payer: BC Managed Care – PPO

## 2021-07-30 ENCOUNTER — Ambulatory Visit: Payer: Self-pay

## 2021-07-30 ENCOUNTER — Ambulatory Visit
Admission: EM | Admit: 2021-07-30 | Discharge: 2021-07-30 | Disposition: A | Discharge status: Home / Self Care | Payer: BC Managed Care – PPO | Attending: Internal Medicine | Admitting: Internal Medicine

## 2021-07-30 DIAGNOSIS — M545 Low back pain, unspecified: Secondary | ICD-10-CM

## 2021-07-30 MED ORDER — KETOROLAC TROMETHAMINE 30 MG/ML IJ SOLN: 30.0000 mg | Freq: Once | INTRAMUSCULAR | Status: DC

## 2021-07-30 MED ORDER — KETOROLAC TROMETHAMINE 30 MG/ML IJ SOLN
30.0000 mg | Freq: Once | INTRAMUSCULAR | Status: AC
  Administered 2021-07-30: 30 mg via INTRAMUSCULAR

## 2021-07-30 MED ORDER — BACLOFEN 10 MG PO TABS: 10.0000 mg | ORAL_TABLET | Freq: Two times a day (BID) | ORAL | 0 refills | Status: DC

## 2021-07-30 MED ORDER — METHYLPREDNISOLONE SODIUM SUCC 40 MG IJ SOLR
60.0000 mg | Freq: Once | INTRAMUSCULAR | Status: AC
  Administered 2021-07-30: 60 mg via INTRAMUSCULAR

## 2021-07-30 NOTE — Discharge Instructions (Signed)
Your x-ray showed some arthritis but no significant concerning findings.  We gave you an injection of steroids and Toradol today.  Please do not take any NSAIDs including aspirin, ibuprofen/Advil, naproxen/Aleve for 12 hours since you are given Toradol today.  You can use Tylenol for breakthrough pain.  Use heat, rest, stretch for symptom relief.  I have called in baclofen that you can use up to twice a day for pain relief.  This will make you sleepy so do not drive or drink alcohol with taking this.  If your symptoms do not improving quickly please call to schedule an appointment with orthopedics.  If anything worsens and you have severe pain, fever, difficulty walking, going to the bathroom on yourself without noticing it, weakness in your lower legs, numbness on your inner thigh you need to go to the emergency room as we discussed. ?

## 2021-07-30 NOTE — ED Triage Notes (Signed)
Patient is here for "Back Pain, happens every couple of years". Usually "heat/icy hot helps, not improving this time". Pain location: "left lower with radiation to hips, no numbness, no tingling".  ?

## 2021-07-30 NOTE — ED Provider Notes (Signed)
?[facility] ? ? ? ?CSN: [id number] ?Arrival date & time: 07/30/21  1012 ? ? ?  ? ?History   ?Chief Complaint ?Chief Complaint  ?Patient presents with  ? Back Pain  ? ? ?HPI ?Sean Espinoza is a 46 y.o. male.  ? ?Patient presents today with a several day history of lower back pain.  He reports pain is rated 10 on a 0-10 pain scale, localized to midline lumbar back with radiation into bilateral hips and upper legs, described as aching with periodic sharp pains, worse with palpation or certain movements, no alleviating factors identified.  He denies any bowel/bladder incontinence, lower extremity weakness, saddle anesthesia.  He has tried heat, IcyHot, Tylenol without improvement.  Denies previous spinal injury or surgery.  Denies any bowel/bladder incontinence, lower extremity weakness, saddle anesthesia.  Denies history of malignancy.  He is unable to perform daily activities as result of pain.  Denies history of diabetes. ? ? ?Past Medical History:  ?Diagnosis Date  ? Atrial fibrillation (HCC)   ? Hypertension   ? MS (multiple sclerosis) (HCC)   ? Vision abnormalities   ? ? ?Patient Active Problem List  ? Diagnosis Date Noted  ? Balance problem 10/19/2020  ? Vitamin D deficiency 10/19/2020  ? OSA (obstructive sleep apnea) 10/19/2020  ? MDD (major depressive disorder), single episode, mild (HCC) 11/12/2018  ? Insomnia due to mental condition 11/12/2018  ? Suicidal ideation 09/25/2018  ? 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 Left-sided weakness 05/04/2018  ? Attention deficit disorder (ADD) in adult 11/06/2016  ? 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 Mixed hyperlipidemia 09/01/2016  ? Metabolic syndrome 09/01/2016  ? GERD (gastroesophageal reflux disease) 09/01/2016  ? Essential hypertension 09/01/2016  ? Class 1 obesity due to excess calories with serious comorbidity and body mass index (BMI) of 31.0 to 31.9 in adult 09/01/2016  ? Cellulitis of left  axilla 09/01/2016  ? Cellulitis of left lower extremity 09/01/2016  ? Anxiety 09/01/2016  ? Sciatica, left side 08/27/2016  ? Multiple sclerosis (HCC) 07/17/2016  ? Gait disturbance 07/17/2016  ? Left optic neuritis 07/17/2016  ? Numbness and tingling in right hand 07/17/2016  ? Blurred vision, left eye   ? TIA (transient ischemic attack) 07/01/2016  ? ? ?Past Surgical History:  ?Procedure Laterality Date  ? CERVICAL FUSION  2005, 2014  ? ? ? ? ? ?Home Medications   ? ?Prior to Admission medications   ?Medication Sig Start Date End Date Taking? Authorizing Provider  ?amphetamine-dextroamphetamine (ADDERALL) 20 MG tablet Take 1 tablet in the morning and 1 around noon. Do not take after 3 pm. 07/19/21  Yes McCue, Shanda Bumps, NP  ?baclofen (LIORESAL) 10 MG tablet Take 1 tablet (10 mg total) by mouth 2 (two) times daily. 07/30/21  Yes Keandra Medero K, PA-C  ?buPROPion (WELLBUTRIN XL) 150 MG 24 hr tablet TAKE 1 TABLET(150 MG) BY MOUTH DAILY 05/02/21  Yes Sater, Pearletha Furl, MD  ?omeprazole (PRILOSEC) 40 MG capsule Take 1 capsule (40 mg total) by mouth daily. 05/02/21  Yes Sater, Pearletha Furl, MD  ?metoprolol tartrate (LOPRESSOR) 25 MG tablet TAKE 1 TABLET BY MOUTH TWICE DAILY 05/27/19   Clapacs, Jackquline Denmark, MD  ?OCREVUS 300 MG/10ML injection INFUSE 600 MG INTRAVENOUSLY EVERY 6 MONTHS ?Patient taking differently: Every 8 mo 01/29/21   Sater, Pearletha Furl, MD  ?VITAMIN D PO Take 5,000 Units by mouth daily.    [provider]  ? ? ?Family History ?Family History  ?  Problem Relation Age of Onset  ? Lupus Mother   ? Healthy Father   ? Lymphoma Maternal Grandfather   ? Colon cancer Paternal Grandmother   ? Alcohol abuse Paternal Uncle   ? Alcohol abuse Paternal Grandfather   ? Kidney cancer Neg Hx   ? Bladder Cancer Neg Hx   ? Prostate cancer Neg Hx   ? ? ?Social History ?Social History  ? ?Tobacco Use  ? Smoking status: Former  ?  Types: Cigarettes  ?  Quit date: 2003  ?  Years since quitting: 20.3  ? Smokeless tobacco: Never  ?Vaping Use  ?  Vaping Use: Never used  ?Substance Use Topics  ? Alcohol use: Not Currently  ?  Comment: social  ? Drug use: No  ? ? ? ?Allergies   ?Patient has no known allergies. ? ? ?Review of Systems ?Review of Systems  ?Constitutional:  Positive for activity change. Negative for appetite change, fatigue and fever.  ?Respiratory:  Negative for cough and shortness of breath.   ?Cardiovascular:  Negative for chest pain.  ?Gastrointestinal:  Negative for abdominal pain, diarrhea, nausea and vomiting.  ?Musculoskeletal:  Positive for back pain and gait problem. Negative for arthralgias, joint swelling and myalgias.  ?Neurological:  Negative for dizziness, weakness, light-headedness, numbness and headaches.  ? ? ?Physical Exam ?Triage Vital Signs ?ED Triage Vitals  ?Enc Vitals Group  ?   BP 07/30/21 1035 (!) 147/102  ?   Pulse Rate 07/30/21 1035 80  ?   Resp 07/30/21 1035 18  ?   Temp 07/30/21 1035 98.7 ?F (37.1 ?C)  ?   Temp Source 07/30/21 1035 Oral  ?   SpO2 07/30/21 1035 100 %  ?   Weight 07/30/21 1033 258 lb (117 kg)  ?   Height 07/30/21 1033  (1.854 m)  ?   Head Circumference --   ?   Peak Flow --   ?   Pain Score 07/30/21 1031 10  ?   Pain Loc --   ?   Pain Edu? --   ?   Excl. in GC? --   ? ?No data found. ? ?Updated Vital Signs ?BP (!) 136/95 (BP Location: Left Arm)   Pulse 80   Temp 98.7 ?F (37.1 ?C) (Oral)   Resp 18   Ht  (1.854 m)   Wt 258 lb (117 kg)   SpO2 100%   BMI 34.04 kg/m?  ? ?Visual Acuity ?Right Eye Distance:   ?Left Eye Distance:   ?Bilateral Distance:   ? ?Right Eye Near:   ?Left Eye Near:    ?Bilateral Near:    ? ?Physical Exam ?Vitals reviewed.  ?Constitutional:   ?   General: He is awake.  ?   Appearance: Normal appearance. He is well-developed. He is not ill-appearing.  ?   Comments: Very pleasant male appears stated age in no acute distress sitting comfortably in exam room  ?HENT:  ?   Head: Normocephalic and atraumatic.  ?Cardiovascular:  ?   Rate and Rhythm: Normal rate and regular  rhythm.  ?   Heart sounds: Normal heart sounds, S1 normal and S2 normal. No murmur heard. ?Pulmonary:  ?   Effort: Pulmonary effort is normal.  ?   Breath sounds: Normal breath sounds. No stridor. No wheezing, rhonchi or rales.  ?   Comments: Clear to auscultation bilaterally ?Abdominal:  ?   Palpations: Abdomen is soft.  ?   Tenderness: There is no abdominal  tenderness.  ?Musculoskeletal:  ?   Cervical back: No tenderness or bony tenderness.  ?   Thoracic back: No tenderness or bony tenderness.  ?   Lumbar back: Tenderness and bony tenderness present. Negative right straight leg raise test and negative left straight leg raise test.  ?   Comments: Strength 5/5 bilateral upper and lower extremities.  ?Neurological:  ?   Mental Status: He is alert.  ?Psychiatric:     ?   Behavior: Behavior is cooperative.  ? ? ? ?UC Treatments / Results  ?Labs ?(all labs ordered are listed, but only abnormal results are displayed) ?Labs Reviewed - No data to display ? ?EKG ? ? ?Radiology ?DG Lumbar Spine Complete ? ?Result Date: 07/30/2021 ?CLINICAL DATA:  Left lower back pain radiating to the hips. EXAM: LUMBAR SPINE - COMPLETE 4+ VIEW COMPARISON:  MRI lumbar spine dated November 25, 2016. FINDINGS: Five lumbar type vertebral bodies. No acute fracture or subluxation. Vertebral body heights are preserved. Alignment is normal. Unchanged mild disc height loss at L5-S1. Remaining intervertebral disc spaces are maintained. The sacroiliac joints are unremarkable. IMPRESSION: 1.  No acute osseous abnormality. 2. Unchanged mild degenerative disc disease at L5-S1. Electronically Signed   By: Obie Dredge M.D.   On: 07/30/2021 11:42   ? ?Procedures ?Procedures (including critical care time) ? ?Medications Ordered in UC ?Medications  ?methylPREDNISolone sodium succinate (SOLU-MEDROL) 40 mg/mL injection 60 mg (60 mg Intramuscular Given 07/30/21 1143)  ?ketorolac (TORADOL) 30 MG/ML injection 30 mg (30 mg Intramuscular Given 07/30/21 1147)   ? ? ?Initial Impression / Assessment and Plan / UC Course  ?I have reviewed the triage vital signs and the nursing notes. ? ?Pertinent labs & imaging results that were available during my care of the patient were reviewed by m

## 2021-08-05 ENCOUNTER — Other Ambulatory Visit: Payer: Self-pay | Admitting: Neurology

## 2021-08-29 ENCOUNTER — Other Ambulatory Visit: Payer: Self-pay | Admitting: Adult Health

## 2021-08-29 MED ORDER — AMPHETAMINE-DEXTROAMPHETAMINE 20 MG PO TABS: ORAL_TABLET | ORAL | 0 refills | Status: DC

## 2021-08-29 NOTE — Telephone Encounter (Signed)
Pt is requesting a refill for amphetamine-dextroamphetamine (ADDERALL) 20 MG tablet.  Pharmacy: [facility] [phone]

## 2021-08-29 NOTE — Telephone Encounter (Signed)
[place] drug registry has been reviewed. Last refill ws 07/19/2021 # 60 for a 30 day supply.  Last office visit was 05/10/2021.

## 2021-10-04 ENCOUNTER — Other Ambulatory Visit: Payer: Self-pay | Admitting: Adult Health

## 2021-10-04 MED ORDER — AMPHETAMINE-DEXTROAMPHETAMINE 20 MG PO TABS: ORAL_TABLET | ORAL | 0 refills | Status: DC

## 2021-10-04 NOTE — Telephone Encounter (Signed)
Pt request refill for amphetamine-dextroamphetamine (ADDERALL) 20 MG tablet at [facility] [phone]

## 2021-11-12 ENCOUNTER — Other Ambulatory Visit: Payer: Self-pay | Admitting: Neurology

## 2021-11-12 MED ORDER — AMPHETAMINE-DEXTROAMPHETAMINE 20 MG PO TABS: ORAL_TABLET | ORAL | 0 refills | Status: DC

## 2021-11-12 NOTE — Telephone Encounter (Signed)
Patient is needing a refill for Adderall 20mg  to the [facility] on [address] in [place] 

## 2021-11-12 NOTE — Addendum Note (Signed)
Addended by: Marlana Latus C on: 11/12/2021 10:14 AM   Modules accepted: Orders

## 2021-11-12 NOTE — Telephone Encounter (Signed)
I have routed this request to Dr Sater for review. The pt is due for the medication and [place] registry was verified.  

## 2021-11-15 ENCOUNTER — Ambulatory Visit: Payer: BC Managed Care – PPO | Admitting: Neurology

## 2021-11-24 ENCOUNTER — Other Ambulatory Visit: Payer: Self-pay | Admitting: Neurology

## 2021-11-28 ENCOUNTER — Telehealth: Payer: Self-pay | Admitting: Adult Health

## 2021-11-28 ENCOUNTER — Other Ambulatory Visit: Payer: Self-pay | Admitting: Neurology

## 2021-11-28 NOTE — Telephone Encounter (Signed)
Contacted [facility] back, spoke to [employer], he questions if pt was going to continue 39months or go back to 24months.  informed him Per note 05/10/21 "Continue Ocrevus currently every 8 months due to low IgG and IgM. Recent infusion 05/03/2021. " advised there has been no changes or encounters since then. Pt has upcoming appt with [place] next week 12/05/21, we will reassess then. He understood

## 2021-11-28 NOTE — Telephone Encounter (Signed)
[facility] Dan[facility] Earthly) calling to verify prescription for OCREVUS 300 MG/10ML injection. Would like a call from the nurse

## 2021-12-05 ENCOUNTER — Ambulatory Visit (INDEPENDENT_AMBULATORY_CARE_PROVIDER_SITE_OTHER): Payer: BC Managed Care – PPO | Admitting: Neurology

## 2021-12-05 ENCOUNTER — Encounter: Payer: Self-pay | Admitting: Neurology

## 2021-12-05 VITALS — BP 150/110 | HR 86 | Ht 73.0 in | Wt 269.0 lb

## 2021-12-05 DIAGNOSIS — G35 Multiple sclerosis: Secondary | ICD-10-CM | POA: Diagnosis not present

## 2021-12-05 DIAGNOSIS — F988 Other specified behavioral and emotional disorders with onset usually occurring in childhood and adolescence: Secondary | ICD-10-CM

## 2021-12-05 DIAGNOSIS — G44219 Episodic tension-type headache, not intractable: Secondary | ICD-10-CM

## 2021-12-05 DIAGNOSIS — Z79899 Other long term (current) drug therapy: Secondary | ICD-10-CM

## 2021-12-05 NOTE — Progress Notes (Signed)
[facility]  PATIENT: Sean Espinoza DOB: March 14, 1976  REFERRING DOCTOR OR PCP:  Referred by Dr. Thad Ranger   SOURCE: paitent, ED notes, imaging reports, lab reports, MRI images on PACS  _________________________________   HISTORICAL  CHIEF COMPLAINT:  Chief Complaint  Patient presents with   Follow-up    RM 1. Last seen 05/10/21. MS DMT: Ocrevus. Pt states that ocrevus has been moved to every 8 months and around month 5 he notices that he is starting to have a hard time. He is wandering if there is something different that needs to be done. The adderall he takes it not helping with the brain fog. He has noted with his left hip when he takes a step, it hurts to walk. He says this started about 6 weeks ago and denies injury.    HISTORY OF PgRESENT ILLNESS:  Sean Espinoza is a 46 y.o. man  diagnosed with relapsing remitting multiple sclerosis in early 2018.     Update 12/05/2021: He is on Ocrevus 600 mg every 8 months for MS.  He feels much worse the last 3 months with more fatigue and generalized feeling weaker.    He has tolerated it well and denies any exacerbation or new neurologic symptoms.   IgG has been mildly low (553 last check; IgM normal)  Gait is ok with mildly reduced balance.   He can go downstairs without using the bannister  He has mild left hand weakness and sometimes has some cramping in the thumb and index fingers.  Leg cramps have resolved.   He had cervical spine fusion x 2 (C5-C7 now)        Bladder is doing well.     He denies any difficulty with vision.   He had ON OD in past.   He gets right sided HA at times.    He has some fatigue and decreased focus/attention.   He takes Adderall at 700, 1200 and 1700 most days. But notes it seems to wear off between doses.    He is sleeping better than last year.   Mood is doing well on Wellbutrin.     Home sleep study 2020 that showed mild OSA with an AHI equals 12.8.  He has since lost 35 pouds so  prefers not to do CPAP .  His wife noted that he is snoring less  NCV/EMG 2021 of left arm showed no significant findings though a mild right C7 chronic radiculopathy cannot be ruled out.  He works for [employer] and does bending and  lifting but weight is usually not high.    MS History:    In mid March 2018, he had the onset of blurry vision out of the left eye. He noted that the visual field was worse on the left side than the right side. Additionally he was unable to read out of the left eye. Colors were mildly desaturated. On April 2, he had the onset of left facial numbness. Over the next day the numbness increased to involve the entire left side, worse in the arm than the leg. He also noted mild weakness in the left arm and reduced balance and gait.[name] He presented to [facility] and an MRI 07/02/2016 showed many T2/FLAIR hyperintense foci, four foci  enhanced  (right frontal subcortical white matter, right corpus callosum, left parietal subcortical white matter and left mesial temporal lobe), consistent with the diagnosis of MS. He was admitted and received 3 days of IV Solu-Medrol. His symptoms  improved some and he was discharged.  He was referred to me and Gilford Raid was started in March 2018.   Due to an exacerbation 04/2017, he switched to Tysabri.    He had an exacerbation 05/2018 and he switched to [facility] 07/2018.    IMAGING: MRI 07/02/2016 showed many T2/FLAIR hyperintense foci, four foci  enhanced  (right frontal subcortical white matter, right corpus callosum, left parietal subcortical white matter and left mesial temporal lobe),   MRI brain 05/13/2018 showed Multiple T2/flair hyperintense foci in the hemispheres, cerebellum and brainstem in a pattern and configuration consistent with chronic demyelinating plaque associated with multiple sclerosis.  None of the foci appears to be acute.  Compared to the MRI dated 03/28/2018, there is no interval change.   There are no acute findings and a  normal enhancement pattern.  MRI cervical spine 05/13/2018 showed no demyelinating plaques in the spinal coed.    C5-C7 fusion with metal hardware at C6-C7.     Mild multilevel degenerative changes as detailed above that do not lead to nerve root compression or spinal cord stenosis.  Unchanged from 2018 MRI.    MRI Brain 03/28/2018 T2/flair hyperintense foci in the left middle cerebellar peduncle, left pons and right cerebellar hemisphere.  None of these appear to be acute and they were all present on the previous MRI.  Additionally, there are T2/flair hyperintense foci in the juxtacortical, periventricular and deep white matter of both hemispheres.  None of these appear to be acute.  Some of the periventricular foci are radially oriented to the ventricles.  They were all present on the MRI from 07/02/2016. [name]  MRI cervical spine 03/28/2018 showed a normal spinal coed.    chronic C5-C7 fusion with metal hardware at C6-C7.     Mild multilevel mild degenerative changes without nerve root compression ir spinal compression    REVIEW OF SYSTEMS: Constitutional: No fevers, chills, sweats, or change in appetite.   Notes fatigue. Eyes: No visual changes, double vision, eye pain Ear, nose and throat: No hearing loss, ear pain, nasal congestion, sore throat Cardiovascular: No chest pain, palpitations Respiratory:  No shortness of breath at rest or with exertion.   No wheezes GastrointestinaI: No nausea, vomiting, diarrhea, abdominal pain, fecal incontinence Genitourinary:  No dysuria, urinary retention or frequency.  No nocturia. Musculoskeletal:  No neck pain, back pain Integumentary: No rash, pruritus, skin lesions Neurological: as above Psychiatric: No depression at this time.  Mild anxiety Endocrine: No palpitations, diaphoresis, change in appetite, change in weigh or increased thirst Hematologic/Lymphatic:  No anemia, purpura, petechiae. Allergic/Immunologic: No itchy/runny eyes, nasal congestion,  recent allergic reactions, rashes  ALLERGIES: No Known Allergies  HOME MEDICATIONS:  Current Outpatient Medications:    amphetamine-dextroamphetamine (ADDERALL) 20 MG tablet, Take 1 tablet in the morning and 1 around noon. Do not take after 3 pm., Disp: 60 tablet, Rfl: 0   baclofen (LIORESAL) 10 MG tablet, Take 1 tablet (10 mg total) by mouth 2 (two) times daily., Disp: 30 each, Rfl: 0   metoprolol tartrate (LOPRESSOR) 25 MG tablet, TAKE 1 TABLET BY MOUTH TWICE DAILY, Disp: 60 tablet, Rfl: 1   OCREVUS 300 MG/10ML injection, INFUSE 600 MG INTRAVENOUSLY EVERY 8 MONTHS, Disp: 20 mL, Rfl: 0   omeprazole (PRILOSEC) 40 MG capsule, TAKE 1 CAPSULE(40 MG) BY MOUTH DAILY, Disp: 90 capsule, Rfl: 3   VITAMIN D PO, Take 5,000 Units by mouth daily., Disp: , Rfl:  No current facility-administered medications for this visit.  Facility-Administered Medications Ordered  in Other Visits:    gadopentetate dimeglumine (MAGNEVIST) injection 20 mL, 20 mL, Intravenous, Once PRN, Jonty Morrical, Pearletha Furl, MD  PAST MEDICAL HISTORY: Past Medical History:  Diagnosis Date   Atrial fibrillation (HCC)    Hypertension    MS (multiple sclerosis) (HCC)    Vision abnormalities     PAST SURGICAL HISTORY: Past Surgical History:  Procedure Laterality Date   CERVICAL FUSION  2005, 2014    FAMILY HISTORY: Family History  Problem Relation Age of Onset   Lupus Mother    Healthy Father    Lymphoma Maternal Grandfather    Colon cancer Paternal Grandmother    Alcohol abuse Paternal Uncle    Alcohol abuse Paternal Grandfather    Kidney cancer Neg Hx    Bladder Cancer Neg Hx    Prostate cancer Neg Hx     SOCIAL HISTORY:  Social History   Socioeconomic History   Marital status: Married    Spouse name: teresa   Number of children: 3   Years of education: Not on file   Highest education level: High school graduate  Occupational History   Not on file  Tobacco Use   Smoking status: Former    Types: Cigarettes     Quit date: 2003    Years since quitting: 20.6   Smokeless tobacco: Never  Vaping Use   Vaping Use: Never used  Substance and Sexual Activity   Alcohol use: Not Currently    Comment: social   Drug use: No   Sexual activity: Yes  Other Topics Concern   Not on file  Social History Narrative   Not on file   Social Determinants of Health   Financial Resource Strain: Low Risk  (11/12/2018)   Overall Financial Resource Strain (CARDIA)    Difficulty of Paying Living Expenses: Not hard at all  Food Insecurity: No Food Insecurity (11/12/2018)   Hunger Vital Sign    Worried About Running Out of Food in the Last Year: Never true    Ran Out of Food in the Last Year: Never true  Transportation Needs: No Transportation Needs (11/12/2018)   PRAPARE - Administrator, Civil Service (Medical): No    Lack of Transportation (Non-Medical): No  Physical Activity: Sufficiently Active (11/12/2018)   Exercise Vital Sign    Days of Exercise per Week: 4 days    Minutes of Exercise per Session: 60 min  Stress: No Stress Concern Present (11/12/2018)   [employer] of Occupational Health - Occupational Stress Questionnaire    Feeling of Stress : Only a little  Social Connections: Unknown (11/12/2018)   Social Connection and Isolation Panel [NHANES]    Frequency of Communication with Friends and Family: Not on file    Frequency of Social Gatherings with Friends and Family: Not on file    Attends Religious Services: Never    Active Member of Clubs or Organizations: No    Attends Banker Meetings: Never    Marital Status: Married  Catering manager Violence: Not At Risk (11/12/2018)   Humiliation, Afraid, Rape, and Kick questionnaire    Fear of Current or Ex-Partner: No    Emotionally Abused: No    Physically Abused: No    Sexually Abused: No     PHYSICAL EXAM  Vitals:   12/05/21 1122  BP: (!) 150/110  Pulse: 86  Weight: 269 lb (122 kg)  Height: 6\' 1"  (1.854 m)     Body mass index is 35.49 kg/m.  General: The patient is well-developed and well-nourished and in no acute distress.     Neurologic Exam  Mental status:    The patient is alert and oriented x 3 at the time of the examination. The patient has apparent normal recent and remote memory, with an apparently normal attention span and concentration ability.   Speech is normal.  Cranial nerves: Extraocular movements are full.    Color vision is symmetric.  Facial strength is normal.  Trapezius and sternocleidomastoid strength is normal. No dysarthria is noted.  No obvious hearing deficits are noted.  Motor:  Muscle bulk is normal.   Tone is normal. Strength is 5/5 on the right but 4++/5 in biceps, APB and FDIO but 5/5 elsewhere.    Sensory: Sensory testing is altered in the palm and dorsum adjacent to thee second and third fingers..  leg sensation was symmetric  Coordination: Cerebellar testing shows good finger nose finger and mildly reduced left heel-to-shin.  Gait and station: Station is normal.  Gait is normal.  Tandem gait is slightly wide.  Romberg is negative.  Reflexes: Deep tendon reflexes are symmetric and 3 at knees and 2 elsewhere.  No ankle clonus.       DIAGNOSTIC DATA (LABS, IMAGING, TESTING) - I reviewed patient records, labs, notes, testing and imaging myself where available.  Lab Results  Component Value Date   WBC 6.5 05/10/2021   HGB 16.2 05/10/2021   HCT 48.1 05/10/2021   MCV 88 05/10/2021   PLT 283 05/10/2021      Component Value Date/Time   NA 144 05/10/2021 1151   K 4.7 05/10/2021 1151   CL 103 05/10/2021 1151   CO2 25 05/10/2021 1151   GLUCOSE 76 05/10/2021 1151   GLUCOSE 125 (H) 09/24/2018 0428   BUN 16 05/10/2021 1151   CREATININE 0.99 05/10/2021 1151   CALCIUM 9.9 05/10/2021 1151   PROT 7.0 05/10/2021 1151   ALBUMIN 4.9 05/10/2021 1151   AST 8 05/10/2021 1151   ALT 12 05/10/2021 1151   ALKPHOS 61 05/10/2021 1151   BILITOT 0.3 05/10/2021 1151    GFRNONAA 103 12/29/2018 1121   GFRAA 119 12/29/2018 1121   _________________________  Multiple sclerosis (HCC) - Plan: IgG, IgA, IgM, CBC with Differential/Platelet, CD20 B Cells  High risk medication use - Plan: IgG, IgA, IgM, CBC with Differential/Platelet, CD20 B Cells  Attention deficit disorder (ADD) in adult  Headache, frequent episodic tension-type   1.   We will continue Ocrevus.  His IgG has been a little low and I moved him from every 6 months to every 8 months.  However, he feels much worse the last 3 months of every cycle.  He asked about move back to every 6 months.  I will have him get 1 g of steroid today.  His next infusion is scheduled in a few weeks.  I will check some lab work and consider putting him back to every 6 months based on the results.   2.   Adderall 20 mg twice a day for ADD and fatigue .   3.   Stay active and exercise as tolerated. 4.   He will return to see [place] in 6 months or sooner if there are new or worsening neurologic symptoms.

## 2021-12-06 LAB — CBC WITH DIFFERENTIAL/PLATELET
Basophils Absolute: 0 10*3/uL (ref 0.0–0.2)
Basos: 0 %
EOS (ABSOLUTE): 0.3 10*3/uL (ref 0.0–0.4)
Eos: 4 %
Hematocrit: 46 % (ref 37.5–51.0)
Hemoglobin: 15.5 g/dL (ref 13.0–17.7)
Immature Grans (Abs): 0 10*3/uL (ref 0.0–0.1)
Immature Granulocytes: 1 %
Lymphocytes Absolute: 1.8 10*3/uL (ref 0.7–3.1)
Lymphs: 27 %
MCH: 30 pg (ref 26.6–33.0)
MCHC: 33.7 g/dL (ref 31.5–35.7)
MCV: 89 fL (ref 79–97)
Monocytes Absolute: 0.5 10*3/uL (ref 0.1–0.9)
Monocytes: 7 %
Neutrophils Absolute: 4.1 10*3/uL (ref 1.4–7.0)
Neutrophils: 61 %
Platelets: 250 10*3/uL (ref 150–450)
RBC: 5.16 x10E6/uL (ref 4.14–5.80)
RDW: 13.2 % (ref 11.6–15.4)
WBC: 6.6 10*3/uL (ref 3.4–10.8)

## 2021-12-06 LAB — IGG, IGA, IGM
IgA/Immunoglobulin A, Serum: 90 mg/dL (ref 90–386)
IgG (Immunoglobin G), Serum: 490 mg/dL — ABNORMAL LOW (ref 603–1613)
IgM (Immunoglobulin M), Srm: 21 mg/dL (ref 20–172)

## 2021-12-06 LAB — CD20 B CELLS
% CD19-B Cells: 2.8 % — ABNORMAL LOW (ref 4.6–22.1)
% CD20-B Cells: 2.8 % — ABNORMAL LOW (ref 5.0–22.3)

## 2021-12-18 ENCOUNTER — Other Ambulatory Visit: Payer: Self-pay | Admitting: Neurology

## 2021-12-20 ENCOUNTER — Other Ambulatory Visit: Payer: Self-pay | Admitting: Neurology

## 2021-12-20 MED ORDER — AMPHETAMINE-DEXTROAMPHETAMINE 20 MG PO TABS: ORAL_TABLET | ORAL | 0 refills | Status: DC

## 2021-12-20 NOTE — Telephone Encounter (Signed)
Pt has an up coming appt and has been checked in the registry. 

## 2021-12-26 ENCOUNTER — Encounter: Payer: Self-pay | Admitting: Neurology

## 2021-12-26 ENCOUNTER — Other Ambulatory Visit: Payer: Self-pay | Admitting: Neurology

## 2021-12-26 MED ORDER — AMPHETAMINE-DEXTROAMPHETAMINE 20 MG PO TABS
ORAL_TABLET | ORAL | 0 refills | Status: DC
Start: 2021-12-26 — End: 2022-02-09

## 2022-02-08 ENCOUNTER — Other Ambulatory Visit: Payer: Self-pay | Admitting: Neurology

## 2022-02-09 ENCOUNTER — Other Ambulatory Visit: Payer: Self-pay | Admitting: Neurology

## 2022-02-11 ENCOUNTER — Other Ambulatory Visit: Payer: Self-pay | Admitting: Neurology

## 2022-02-11 MED ORDER — AMPHETAMINE-DEXTROAMPHETAMINE 20 MG PO TABS
ORAL_TABLET | ORAL | 0 refills | Status: DC
Start: 2022-02-11 — End: 2022-02-11

## 2022-02-11 MED ORDER — AMPHETAMINE-DEXTROAMPHETAMINE 20 MG PO TABS
ORAL_TABLET | ORAL | 0 refills | Status: DC
Start: 2022-02-11 — End: 2022-03-12

## 2022-02-11 MED ORDER — AMPHETAMINE-DEXTROAMPHETAMINE 20 MG PO TABS: ORAL_TABLET | ORAL | 0 refills | Status: DC

## 2022-02-11 NOTE — Addendum Note (Signed)
Addended by: Despina Arias A on: 02/11/2022 05:26 PM   Modules accepted: Orders

## 2022-03-12 ENCOUNTER — Other Ambulatory Visit: Payer: Self-pay | Admitting: Neurology

## 2022-03-12 MED ORDER — AMPHETAMINE-DEXTROAMPHETAMINE 20 MG PO TABS: ORAL_TABLET | ORAL | 0 refills | Status: DC

## 2022-04-14 ENCOUNTER — Other Ambulatory Visit: Payer: Self-pay | Admitting: Neurology

## 2022-04-15 MED ORDER — AMPHETAMINE-DEXTROAMPHETAMINE 20 MG PO TABS: ORAL_TABLET | ORAL | 0 refills | Status: DC

## 2022-05-14 ENCOUNTER — Other Ambulatory Visit: Payer: Self-pay | Admitting: Neurology

## 2022-05-14 MED ORDER — AMPHETAMINE-DEXTROAMPHETAMINE 20 MG PO TABS: ORAL_TABLET | ORAL | 0 refills | Status: DC

## 2022-05-21 ENCOUNTER — Ambulatory Visit (INDEPENDENT_AMBULATORY_CARE_PROVIDER_SITE_OTHER): Payer: BC Managed Care – PPO | Admitting: Podiatry

## 2022-05-21 ENCOUNTER — Encounter: Payer: Self-pay | Admitting: Podiatry

## 2022-05-21 VITALS — BP 201/108 | HR 84

## 2022-05-21 DIAGNOSIS — L600 Ingrowing nail: Secondary | ICD-10-CM

## 2022-05-21 NOTE — Progress Notes (Signed)
   Chief Complaint  Patient presents with   Nail Problem    "The two little toes hurt when I wear certain shoes." N - ingrown toenail L - 4th and 5th left D - 1 year O - intermitten C - tender, sore, sharp pain by the end of the day A - certain shoes T - buy shoes every 6-8 weeks to accommodate it    Subjective: Patient presents today for evaluation of pain to the lateral border of the left fifth toe. Patient is concerned for possible ingrown nail.  It is very sensitive to touch.  Patient states that he has had pain and tenderness to this area for over 1 year.  He tries different shoes with no relief.  He also tried shaving the lesion down and trimming the toenail with no improvement.  Patient presents today for further treatment and evaluation.  Past Medical History:  Diagnosis Date   Atrial fibrillation ([place])    Hypertension    MS (multiple sclerosis) ([place])    Vision abnormalities     Objective:  General: Well developed, nourished, in no acute distress, alert and oriented x3   Dermatology: Skin is warm, dry and supple bilateral.  Lateral border of the left fifth toe is tender with evidence of an ingrowing nail. Pain on palpation noted to the border of the nail fold.  There is also some hyperkeratotic callus tissue noted around the toenail area.  The remaining nails appear unremarkable at this time. There are no open sores, lesions.  Vascular: DP and PT pulses palpable.  No clinical evidence of vascular compromise  Neruologic: Grossly intact via light touch bilateral.  Musculoskeletal: No pedal deformity noted  Assesement: #1 Paronychia with ingrowing nail lateral border left great toe with callus formation  Plan of Care:  1. Patient evaluated.  2. Discussed treatment alternatives and plan of care.  Excisional debridement of the hyperkeratotic callus was performed today using a 312 scalpel without incident or bleeding and the patient did feel some small degree of relief 3.   For more definitive/permanent correction to the area we did discuss Winograd type matricectomy which would need to be performed in the [employer] office in our procedure room.  The procedure was explained in detail as well as the postoperative recovery course.  The patient would like to proceed with this procedural option for more definitive solution versus simple debridement of the area.  Risk benefits advantages and disadvantages as well as the postoperative recovery course were explained in detail.  No guarantees were expressed or implied. 4.  Authorization for an office procedure was initiated today.  The in office procedure will consist of Winograd type matricectomy left fifth digit 5.  Return to clinic morning of in office procedure in our [place] office  *Run a Illene Regulus Lay route  Edrick Kins, DPM [facility]  Dr. Edrick Kins, DPM    [address], [place] [address]                Office [phone]  Fax [phone]

## 2022-05-27 ENCOUNTER — Telehealth: Payer: Self-pay | Admitting: Urology

## 2022-05-27 NOTE — Telephone Encounter (Signed)
OFFICE DOS - 06/19/22  EXC NAIL PERM 5TH LEFT --- [id number]   [employer]   SPOKE WITH ELAINA P. WITH [employer] AND SHE STATED THAT FOR CPT CODE 83151 NO PRIOR AUTH IS REQUIRED.  REF # [id number]

## 2022-06-03 NOTE — Progress Notes (Deleted)
[facility]  PATIENT: Sean Espinoza DOB: September 22, 1975  REFERRING DOCTOR OR PCP:  Referred by Dr. Doy Mince   SOURCE: paitent, ED notes, imaging reports, lab reports, MRI images on PACS  _________________________________   HISTORICAL  CHIEF COMPLAINT:  No chief complaint on file.   HISTORY OF PgRESENT ILLNESS:  Sean Espinoza is a 47 y.o. man  diagnosed with relapsing remitting multiple sclerosis in early 2018.     Update 06/04/2022: Previously seen by Dr. Felecia Shelling 12/05/2021.  He is on Ocrevus 600 mg every 6 months. His IgG had been on the lower side therefore was moved from 6 months to every 8 month infusion but was feeling worse with increased fatigue and generalized weakness for the last 3 months of each cycle. Repeat lab work showed reduced B-cell count which is expected on Ocrevus but Dr. Felecia Shelling wanted to see even lower levels therefore was transition back to every 6 months.  Last infusion ***.    Gait is ok with mildly reduced balance.   He can go downstairs without using the bannister  He has mild left hand weakness and sometimes has some cramping in the thumb and index fingers.  Leg cramps have resolved.   He had cervical spine fusion x 2 (C5-C7 now)        Bladder is doing well.     He denies any difficulty with vision.   He had ON OD in past.   He gets right sided HA at times.    He has some fatigue and decreased focus/attention.   He takes Adderall at 700, 1200 and 1700 most days. But notes it seems to wear off between doses.    He is sleeping better than last year.   Mood is doing well on Wellbutrin.     Home sleep study 2020 that showed mild OSA with an AHI equals 12.8.  He has since lost 35 pouds so prefers not to do CPAP .  His wife noted that he is snoring less  NCV/EMG 2021 of left arm showed no significant findings though a mild right C7 chronic radiculopathy cannot be ruled out.  He works for [employer] and does bending and  lifting but weight is  usually not high.    MS History:    In mid March 2018, he had the onset of blurry vision out of the left eye. He noted that the visual field was worse on the left side than the right side. Additionally he was unable to read out of the left eye. Colors were mildly desaturated. On April 2, he had the onset of left facial numbness. Over the next day the numbness increased to involve the entire left side, worse in the arm than the leg. He also noted mild weakness in the left arm and reduced balance and gait.[name] He presented to [facility] and an MRI 07/02/2016 showed many T2/FLAIR hyperintense foci, four foci  enhanced  (right frontal subcortical white matter, right corpus callosum, left parietal subcortical white matter and left mesial temporal lobe), consistent with the diagnosis of MS. He was admitted and received 3 days of IV Solu-Medrol. His symptoms improved some and he was discharged.  He was referred to me and Melchor Amour was started in March 2018.   Due to an exacerbation 04/2017, he switched to Tysabri.    He had an exacerbation 05/2018 and he switched to [facility] 07/2018.    IMAGING: MRI 07/02/2016 showed many T2/FLAIR hyperintense foci, four foci  enhanced  (  right frontal subcortical white matter, right corpus callosum, left parietal subcortical white matter and left mesial temporal lobe),   MRI brain 05/13/2018 showed Multiple T2/flair hyperintense foci in the hemispheres, cerebellum and brainstem in a pattern and configuration consistent with chronic demyelinating plaque associated with multiple sclerosis.  None of the foci appears to be acute.  Compared to the MRI dated 03/28/2018, there is no interval change.   There are no acute findings and a normal enhancement pattern.  MRI cervical spine 05/13/2018 showed no demyelinating plaques in the spinal coed.    C5-C7 fusion with metal hardware at C6-C7.     Mild multilevel degenerative changes as detailed above that do not lead to nerve root compression or  spinal cord stenosis.  Unchanged from 2018 MRI.    MRI Brain 03/28/2018 T2/flair hyperintense foci in the left middle cerebellar peduncle, left pons and right cerebellar hemisphere.  None of these appear to be acute and they were all present on the previous MRI.  Additionally, there are T2/flair hyperintense foci in the juxtacortical, periventricular and deep white matter of both hemispheres.  None of these appear to be acute.  Some of the periventricular foci are radially oriented to the ventricles.  They were all present on the MRI from 07/02/2016. [name]  MRI cervical spine 03/28/2018 showed a normal spinal coed.    chronic C5-C7 fusion with metal hardware at C6-C7.     Mild multilevel mild degenerative changes without nerve root compression ir spinal compression    REVIEW OF SYSTEMS: Constitutional: No fevers, chills, sweats, or change in appetite.   Notes fatigue. Eyes: No visual changes, double vision, eye pain Ear, nose and throat: No hearing loss, ear pain, nasal congestion, sore throat Cardiovascular: No chest pain, palpitations Respiratory:  No shortness of breath at rest or with exertion.   No wheezes GastrointestinaI: No nausea, vomiting, diarrhea, abdominal pain, fecal incontinence Genitourinary:  No dysuria, urinary retention or frequency.  No nocturia. Musculoskeletal:  No neck pain, back pain Integumentary: No rash, pruritus, skin lesions Neurological: as above Psychiatric: No depression at this time.  Mild anxiety Endocrine: No palpitations, diaphoresis, change in appetite, change in weigh or increased thirst Hematologic/Lymphatic:  No anemia, purpura, petechiae. Allergic/Immunologic: No itchy/runny eyes, nasal congestion, recent allergic reactions, rashes  ALLERGIES: No Known Allergies  HOME MEDICATIONS:  Current Outpatient Medications:    amphetamine-dextroamphetamine (ADDERALL) 20 MG tablet, Take 1 tablet in the morning and 1 around noon. Do not take after 3 pm., Disp: 60  tablet, Rfl: 0   baclofen (LIORESAL) 10 MG tablet, Take 1 tablet (10 mg total) by mouth 2 (two) times daily. (Patient not taking: Reported on 05/21/2022), Disp: 30 each, Rfl: 0   metoprolol tartrate (LOPRESSOR) 25 MG tablet, TAKE 1 TABLET BY MOUTH TWICE DAILY (Patient not taking: Reported on 05/21/2022), Disp: 60 tablet, Rfl: 1   OCREVUS 300 MG/10ML injection, INFUSE 600 MG INTRAVENOUSLY EVERY 8 MONTHS, Disp: 20 mL, Rfl: 0   omeprazole (PRILOSEC) 40 MG capsule, TAKE 1 CAPSULE(40 MG) BY MOUTH DAILY, Disp: 90 capsule, Rfl: 3   VITAMIN D PO, Take 5,000 Units by mouth daily., Disp: , Rfl:  No current facility-administered medications for this visit.  Facility-Administered Medications Ordered in Other Visits:    gadopentetate dimeglumine (MAGNEVIST) injection 20 mL, 20 mL, Intravenous, Once PRN, Sater, Nanine Means, MD  PAST MEDICAL HISTORY: Past Medical History:  Diagnosis Date   Atrial fibrillation ([place])    Hypertension    MS (multiple sclerosis) ([place])  Vision abnormalities     PAST SURGICAL HISTORY: Past Surgical History:  Procedure Laterality Date   CERVICAL FUSION  2005, 2014    FAMILY HISTORY: Family History  Problem Relation Age of Onset   Lupus Mother    Healthy Father    Lymphoma Maternal Grandfather    Colon cancer Paternal Grandmother    Alcohol abuse Paternal Uncle    Alcohol abuse Paternal Grandfather    Kidney cancer Neg Hx    Bladder Cancer Neg Hx    Prostate cancer Neg Hx     SOCIAL HISTORY:  Social History   Socioeconomic History   Marital status: Married    Spouse name: teresa   Number of children: 3   Years of education: Not on file   Highest education level: High school graduate  Occupational History   Not on file  Tobacco Use   Smoking status: Former    Types: Cigarettes    Quit date: 2003    Years since quitting: 21.1   Smokeless tobacco: Never  Vaping Use   Vaping Use: Never used  Substance and Sexual Activity   Alcohol use: Not Currently     Comment: social   Drug use: No   Sexual activity: Yes  Other Topics Concern   Not on file  Social History Narrative   Not on file   Social Determinants of Health   Financial Resource Strain: Low Risk  (11/12/2018)   Overall Financial Resource Strain (CARDIA)    Difficulty of Paying Living Expenses: Not hard at all  Food Insecurity: No Food Insecurity (11/12/2018)   Hunger Vital Sign    Worried About Running Out of Food in the Last Year: Never true    Saline in the Last Year: Never true  Transportation Needs: No Transportation Needs (11/12/2018)   PRAPARE - Hydrologist (Medical): No    Lack of Transportation (Non-Medical): No  Physical Activity: Sufficiently Active (11/12/2018)   Exercise Vital Sign    Days of Exercise per Week: 4 days    Minutes of Exercise per Session: 60 min  Stress: No Stress Concern Present (11/12/2018)   [place]    Feeling of Stress : Only a little  Social Connections: Unknown (11/12/2018)   Social Connection and Isolation Panel [NHANES]    Frequency of Communication with Friends and Family: Not on file    Frequency of Social Gatherings with Friends and Family: Not on file    Attends Religious Services: Never    Active Member of Clubs or Organizations: No    Attends Archivist Meetings: Never    Marital Status: Married  Human resources officer Violence: Not At Risk (11/12/2018)   Humiliation, Afraid, Rape, and Kick questionnaire    Fear of Current or Ex-Partner: No    Emotionally Abused: No    Physically Abused: No    Sexually Abused: No     PHYSICAL EXAM  There were no vitals filed for this visit.   There is no height or weight on file to calculate BMI.   General: The patient is well-developed and well-nourished and in no acute distress.     Neurologic Exam  Mental status:    The patient is alert and oriented x 3 at the time of the  examination. The patient has apparent normal recent and remote memory, with an apparently normal attention span and concentration ability.   Speech is normal.  Cranial nerves: Extraocular movements are full.    Color vision is symmetric.  Facial strength is normal.  Trapezius and sternocleidomastoid strength is normal. No dysarthria is noted.  No obvious hearing deficits are noted.  Motor:  Muscle bulk is normal.   Tone is normal. Strength is 5/5 on the right but 4++/5 in biceps, APB and FDIO but 5/5 elsewhere.    Sensory: Sensory testing is altered in the palm and dorsum adjacent to thee second and third fingers..  leg sensation was symmetric  Coordination: Cerebellar testing shows good finger nose finger and mildly reduced left heel-to-shin.  Gait and station: Station is normal.  Gait is normal.  Tandem gait is slightly wide.  Romberg is negative.  Reflexes: Deep tendon reflexes are symmetric and 3 at knees and 2 elsewhere.  No ankle clonus.       DIAGNOSTIC DATA (LABS, IMAGING, TESTING) - I reviewed patient records, labs, notes, testing and imaging myself where available.  Lab Results  Component Value Date   WBC 6.6 12/05/2021   HGB 15.5 12/05/2021   HCT 46.0 12/05/2021   MCV 89 12/05/2021   PLT 250 12/05/2021      Component Value Date/Time   NA 144 05/10/2021 1151   K 4.7 05/10/2021 1151   CL 103 05/10/2021 1151   CO2 25 05/10/2021 1151   GLUCOSE 76 05/10/2021 1151   GLUCOSE 125 (H) 09/24/2018 0428   BUN 16 05/10/2021 1151   CREATININE 0.99 05/10/2021 1151   CALCIUM 9.9 05/10/2021 1151   PROT 7.0 05/10/2021 1151   ALBUMIN 4.9 05/10/2021 1151   AST 8 05/10/2021 1151   ALT 12 05/10/2021 1151   ALKPHOS 61 05/10/2021 1151   BILITOT 0.3 05/10/2021 1151   GFRNONAA 103 12/29/2018 1121   GFRAA 119 12/29/2018 1121   _________________________  No diagnosis found.   1.   We will continue Ocrevus every 6 months.  Repeat lab work today (IgG, IgA, IgM, CBC with diff and CD20  B cells). 2.   Adderall 20 mg twice a day for ADD and fatigue .   3.   Stay active and exercise as tolerated. 4.   He will return to see [place] in 6 months or sooner if there are new or worsening neurologic symptoms.      I spent *** minutes of face-to-face and non-face-to-face time with patient.  This included previsit chart review, lab review, study review, order entry, electronic health record documentation, patient education  Frann Rider, [facility]  [facility] [address] [place], [place] [address]  Phone [phone] Fax [phone] Note: This document was prepared with digital dictation and possible smart phrase technology. Any transcriptional errors that result from this process are unintentional.

## 2022-06-04 ENCOUNTER — Ambulatory Visit: Payer: BC Managed Care – PPO | Admitting: Adult Health

## 2022-06-04 ENCOUNTER — Telehealth: Payer: Self-pay | Admitting: Neurology

## 2022-06-04 ENCOUNTER — Encounter: Payer: Self-pay | Admitting: Adult Health

## 2022-06-04 NOTE — Telephone Encounter (Signed)
Pt cancelled appt due forgot what time of the appt. Informed pt of $50 show fee and would reschedule appt after discussing with Billing. Pt stated, I'll find another office. Pt hung up.

## 2022-06-13 ENCOUNTER — Other Ambulatory Visit: Payer: Self-pay | Admitting: *Deleted

## 2022-06-13 DIAGNOSIS — G35 Multiple sclerosis: Secondary | ICD-10-CM

## 2022-06-13 MED ORDER — OCREVUS 300 MG/10ML IV SOLN: 600.0000 mg | INTRAVENOUS | 0 refills | Status: DC

## 2022-06-16 ENCOUNTER — Other Ambulatory Visit: Payer: Self-pay | Admitting: Neurology

## 2022-06-17 MED ORDER — AMPHETAMINE-DEXTROAMPHETAMINE 20 MG PO TABS: ORAL_TABLET | ORAL | 0 refills | Status: DC

## 2022-06-19 ENCOUNTER — Ambulatory Visit (INDEPENDENT_AMBULATORY_CARE_PROVIDER_SITE_OTHER): Payer: BC Managed Care – PPO | Admitting: Podiatry

## 2022-06-19 DIAGNOSIS — L600 Ingrowing nail: Secondary | ICD-10-CM

## 2022-06-19 MED ORDER — HYDROCODONE-ACETAMINOPHEN 5-325 MG PO TABS: 1.0000 | ORAL_TABLET | Freq: Four times a day (QID) | ORAL | 0 refills | Status: DC | PRN

## 2022-06-19 NOTE — Progress Notes (Signed)
   OPERATIVE REPORT Patient name: Sean Espinoza MRN: [id number] DOB: 1976-02-04  DOS:  06/19/22  Preop Dx: Ingrown toenail left fifth toe Postop Dx: same  Procedure:  1. Winograd matrixectomy lateral border left fifth toe  Surgeon: Edrick Kins DPM  Anesthesia: 2% lidocaine plain totaling 3 mL infiltrated around the surgical area  Hemostasis: None  EBL: Minimal mL Materials: None Injectables: None Pathology: None  Condition: The patient tolerated the procedure and anesthesia well. No complications noted or reported   Justification for procedure: The patient is a 47 y.o. male who presents today for correction of a symptomatic ingrown toenail with callus lateral border left fifth toe. Conservative modalities of been unsuccessful in providing any sort of satisfactory alleviation of symptoms with the patient. The patient was told benefits as well as possible side effects of the surgery. The patient consented for surgical correction. The patient consent form was reviewed. All patient questions were answered. No guarantees were expressed or implied.   Procedure in Detail: The patient was brought to the procedure room, placed in the procedure chair in the supine position at which time an aseptic scrub and drape were performed about the patient's respective lower extremity after anesthesia was induced as described above. Attention was then directed to the surgical area where procedure number one commenced.  Procedure #1: Molli Knock matrixectomy lateral border left fifth toe A 1.5 cm elliptical incision was planned and made overlying the lateral aspect of the fifth digit left foot.  Incision was carried directly down to the level of bone and the ellipse skin wedge with the encompassing ingrown portion of the toenail and symptomatic callus was excisionally removed.  There was some small underlying bone prominence that was resected away using a tissue nipper.  Irrigation was utilized and  primary closure was achieved using 4-0 nylon suture.  Dry sterile compressive dressings were then applied about the patient's lower extremity. The patient was then discharged from the office with adequate prescriptions for analgesia. Verbal as well as written instructions were provided for the patient regarding postprocedural care. The patient is to keep the dressings clean dry and intact until they are to follow up in the office upon discharge in one week.   Edrick Kins, DPM [facility]  Dr. Edrick Kins, DPM    [address], [place] [address]                Office [phone]  Fax [phone]

## 2022-06-25 ENCOUNTER — Encounter: Payer: BC Managed Care – PPO | Admitting: Podiatry

## 2022-06-25 ENCOUNTER — Ambulatory Visit (INDEPENDENT_AMBULATORY_CARE_PROVIDER_SITE_OTHER): Payer: BC Managed Care – PPO | Admitting: Podiatry

## 2022-06-25 DIAGNOSIS — Z9889 Other specified postprocedural states: Secondary | ICD-10-CM

## 2022-06-25 NOTE — Progress Notes (Signed)
   Chief Complaint  Patient presents with   Routine Post Op    POV #1 DOS - WINOGRAD NAIL  MATRIXECTOMY LEFT 5TH TOE, patient denies any N/V/F/C/SOB, blister on the heel from the surgical shoe     Subjective:  Patient presents today status post Winograd type matricectomy performed in the office on 06/19/2022 to the left fifth digit.  Patient is doing well.  WBAT in surgical shoe as instructed.  No new complaints at this time  Past Medical History:  Diagnosis Date   Atrial fibrillation ([place])    Hypertension    MS (multiple sclerosis) ([place])    Vision abnormalities     Past Surgical History:  Procedure Laterality Date   CERVICAL FUSION  2005, 2014    No Known Allergies  Objective/Physical Exam Neurovascular status intact.  Incision well coapted with sutures intact. No sign of infectious process noted. No dehiscence. No active bleeding noted.    Assessment: 1. s/p Winograd matricectomy lateral border left fifth toe.  Date of procedure 06/19/2022 performed in office  -Recommend triple antibiotic and a Band-Aid daily -Patient may wash and shower and get the foot wet -Transition out of the postsurgical shoe into good supportive tennis shoes and sneakers that do not irritate the toe -Refrain from work additional 2 weeks.  Patient works at [employer] -Return to clinic 1 week suture removal   Edrick Kins, DPM [facility]  Dr. Edrick Kins, DPM    [address], [place] [address]                Office [phone]  Fax [phone]

## 2022-07-02 ENCOUNTER — Ambulatory Visit (INDEPENDENT_AMBULATORY_CARE_PROVIDER_SITE_OTHER): Payer: BC Managed Care – PPO | Admitting: Podiatry

## 2022-07-02 ENCOUNTER — Encounter: Payer: BC Managed Care – PPO | Admitting: Podiatry

## 2022-07-02 ENCOUNTER — Encounter: Payer: Self-pay | Admitting: Podiatry

## 2022-07-02 VITALS — BP 156/101 | HR 82

## 2022-07-02 DIAGNOSIS — Z9889 Other specified postprocedural states: Secondary | ICD-10-CM

## 2022-07-02 NOTE — Progress Notes (Signed)
   Chief Complaint  Patient presents with   Routine Post Op    "It's not too bad.  It looks better than it did."    Subjective:  Patient presents today status post Winograd type matricectomy performed in the office on 06/19/2022 to the left fifth digit.  Patient can continues to do well.  He is currently WBAT in tennis shoes.  No new complaints at this time  Past Medical History:  Diagnosis Date   Atrial fibrillation    Hypertension    MS (multiple sclerosis)    Vision abnormalities     Past Surgical History:  Procedure Laterality Date   CERVICAL FUSION  2005, 2014    No Known Allergies  Objective/Physical Exam Neurovascular status intact.  Incision well coapted with sutures intact.  No edema.  Overall well-healing surgical toe  Assessment: 1. s/p Winograd matricectomy lateral border left fifth toe.  Date of procedure 06/19/2022 performed in office  - Sutures removed -Patient may slowly increase activity over the following 2-3 weeks -Return to clinic 2-3 weeks to discuss return to work date.  Current only planning to return to work 07/21/2022  Edrick Kins, DPM [facility]  Dr. Edrick Kins, DPM    [address] [place],  [address]                Office [phone]  Fax [phone]

## 2022-07-04 DIAGNOSIS — M79676 Pain in unspecified toe(s): Secondary | ICD-10-CM

## 2022-07-09 ENCOUNTER — Encounter: Payer: BC Managed Care – PPO | Admitting: Podiatry

## 2022-07-16 ENCOUNTER — Ambulatory Visit (INDEPENDENT_AMBULATORY_CARE_PROVIDER_SITE_OTHER): Payer: BC Managed Care – PPO | Admitting: Podiatry

## 2022-07-16 ENCOUNTER — Encounter: Payer: Self-pay | Admitting: Podiatry

## 2022-07-16 ENCOUNTER — Encounter: Payer: BC Managed Care – PPO | Admitting: Podiatry

## 2022-07-16 DIAGNOSIS — Z9889 Other specified postprocedural states: Secondary | ICD-10-CM

## 2022-07-16 NOTE — Progress Notes (Signed)
   Chief Complaint  Patient presents with   Routine Post Op    "It seems to be doing alright."    Subjective:  Patient presents today status post Winograd type matricectomy performed in the office on 06/19/2022 to the left fifth digit.  Patient doing well.  He continues to be walking and ambulating in tennis shoes without any pain or tenderness  Past Medical History:  Diagnosis Date   Atrial fibrillation    Hypertension    MS (multiple sclerosis)    Vision abnormalities     Past Surgical History:  Procedure Laterality Date   CERVICAL FUSION  2005, 2014    No Known Allergies  Objective/Physical Exam Neurovascular status intact.  Incision healed.  There is some callus tissue around the area but there is no pain or tenderness with palpation or edema.  Well-healed surgical toe  Assessment: 1. s/p Winograd matricectomy lateral border left fifth toe.  Date of procedure 06/19/2022 performed in office  - Light debridement of the callus area was performed today using a tissue nipper. -Patient may return to work beginning 07/21/2022.  Light duty only for 1 week.  He may resume full activity with no restrictions beginning 07/29/2022 -Return to clinic as needed  Felecia Shelling, DPM [facility]  Dr. Felecia Shelling, DPM    [address] [place], [place] [address]                Office [phone]  Fax [phone]

## 2022-07-19 ENCOUNTER — Other Ambulatory Visit: Payer: Self-pay | Admitting: Neurology

## 2022-07-22 MED ORDER — AMPHETAMINE-DEXTROAMPHETAMINE 20 MG PO TABS: ORAL_TABLET | ORAL | 0 refills | Status: DC

## 2022-07-22 NOTE — Telephone Encounter (Signed)
Last seen on 12/05/21 per note " Adderall 20 mg twice a day for ADD and fatigue . " No 6 month follow up scheduled  Last filled on 06/17/22 # 60 tablets (30 day supply) Rx pending to be signed

## 2022-08-21 ENCOUNTER — Other Ambulatory Visit: Payer: Self-pay | Admitting: Neurology

## 2022-08-21 MED ORDER — AMPHETAMINE-DEXTROAMPHETAMINE 20 MG PO TABS: ORAL_TABLET | ORAL | 0 refills | Status: DC

## 2022-09-20 ENCOUNTER — Other Ambulatory Visit: Payer: Self-pay | Admitting: Neurology

## 2022-09-23 MED ORDER — AMPHETAMINE-DEXTROAMPHETAMINE 20 MG PO TABS: ORAL_TABLET | ORAL | 0 refills | Status: DC

## 2022-10-05 ENCOUNTER — Other Ambulatory Visit: Payer: Self-pay | Admitting: Neurology

## 2022-10-08 NOTE — Telephone Encounter (Signed)
Last seen on 12/05/21 No 6 month follow up scheduled  Last filled on 07/15/22 #30 tablets (30 day supply)

## 2022-10-26 ENCOUNTER — Other Ambulatory Visit: Payer: Self-pay | Admitting: Neurology

## 2022-10-28 MED ORDER — AMPHETAMINE-DEXTROAMPHETAMINE 20 MG PO TABS: ORAL_TABLET | ORAL | 0 refills | Status: DC

## 2022-10-28 NOTE — Telephone Encounter (Signed)
Last seen on 12/05/21 No 6 month follow up scheduled Last filled on 09/23/22 #60 tablets (30 day supply) Rx pending to be signed

## 2022-11-27 ENCOUNTER — Other Ambulatory Visit: Payer: Self-pay

## 2022-11-28 ENCOUNTER — Encounter: Payer: Self-pay | Admitting: Neurology

## 2022-11-28 ENCOUNTER — Ambulatory Visit (INDEPENDENT_AMBULATORY_CARE_PROVIDER_SITE_OTHER): Payer: BC Managed Care – PPO | Admitting: Neurology

## 2022-11-28 VITALS — BP 146/106 | HR 84 | Ht 73.0 in | Wt 276.6 lb

## 2022-11-28 DIAGNOSIS — H539 Unspecified visual disturbance: Secondary | ICD-10-CM | POA: Diagnosis not present

## 2022-11-28 DIAGNOSIS — I10 Essential (primary) hypertension: Secondary | ICD-10-CM | POA: Diagnosis not present

## 2022-11-28 DIAGNOSIS — G35 Multiple sclerosis: Secondary | ICD-10-CM | POA: Diagnosis not present

## 2022-11-28 DIAGNOSIS — Z79899 Other long term (current) drug therapy: Secondary | ICD-10-CM

## 2022-11-28 MED ORDER — METOPROLOL TARTRATE 25 MG PO TABS: 25.0000 mg | ORAL_TABLET | Freq: Two times a day (BID) | ORAL | 2 refills | Status: DC

## 2022-11-28 NOTE — Progress Notes (Signed)
[facility]  PATIENT: Sean Espinoza DOB: Nov 03, 1975  REFERRING DOCTOR OR PCP:  Referred by Dr. Thad Ranger   SOURCE: paitent, ED notes, imaging reports, lab reports, MRI images on PACS  _________________________________   HISTORICAL  CHIEF COMPLAINT:  Chief Complaint  Patient presents with   Follow-up    Pt in room 10. Here for vision concerns. Pt said this morning it was hard to see, pt as the morning passed his vision became better. Pt said he thinks his vision is related to blood pressure, he was taking blood pressure medication but stopped because it was well controlled.     HISTORY OF PgRESENT ILLNESS:  Sean Espinoza is a 47 y.o. man  diagnosed with relapsing remitting multiple sclerosis in early 2018.     Update 11/28/2022 This was an urgent work in visit due to visual changes.   Vision was blurry and he had to squint to see more.   His BP is very elevated today - 146/106.   He reports because BP had stabilized and he no longer had AFib that the metoprolol was stopped last year.   His PCP has moved and he has an appt in 3-4 weeks with a new one at the same practice.    He had ON OD in past.    He is on Ocrevus 600 mg every 8 months for MS (next infusion is October 2024.  He feels much worse the last 3 months with more fatigue and generalized feeling weaker.    He has tolerated it well and denies any exacerbation or new neurologic symptoms.   IgG has been mildly low (553 last check; IgM normal)  Gait is ok with mildly reduced balance.   He can go downstairs without using the bannister  He has mild left hand weakness and sometimes has some cramping in the thumb and index fingers.  Leg cramps have resolved.   He had cervical spine fusion x 2 (C5-C7 now)     Bladder is doing well.      He gets right sided HA once a month.    He has some fatigue and decreased focus/attention.   He takes Adderall at 700, 1200 and 1700 most days. But notes it seems to wear off  between doses.    He is sleeping better than last year.   Mood is doing well on Wellbutrin.     Home sleep study 2020 that showed mild OSA with an AHI equals 12.8.     His wife noted that he is snoring less since losing a few pounds  NCV/EMG 2021 of left arm showed no significant findings though a mild right C7 chronic radiculopathy cannot be ruled out.  He works for [employer] and does bending and  lifting but weight is usually not high.  He averages 10-15000 steps a day.     MS History:    In mid March 2018, he had the onset of blurry vision out of the left eye. He noted that the visual field was worse on the left side than the right side. Additionally he was unable to read out of the left eye. Colors were mildly desaturated. On April 2, he had the onset of left facial numbness. Over the next day the numbness increased to involve the entire left side, worse in the arm than the leg. He also noted mild weakness in the left arm and reduced balance and gait.[name] He presented to [facility] and an MRI 07/02/2016 showed  many T2/FLAIR hyperintense foci, four foci  enhanced  (right frontal subcortical white matter, right corpus callosum, left parietal subcortical white matter and left mesial temporal lobe), consistent with the diagnosis of MS. He was admitted and received 3 days of IV Solu-Medrol. His symptoms improved some and he was discharged.  He was referred to me and Gilford Raid was started in March 2018.   Due to an exacerbation 04/2017, he switched to Tysabri.    He had an exacerbation 05/2018 and he switched to [facility] 07/2018.    IMAGING: MRI 07/02/2016 showed many T2/FLAIR hyperintense foci, four foci  enhanced  (right frontal subcortical white matter, right corpus callosum, left parietal subcortical white matter and left mesial temporal lobe),   MRI brain 05/13/2018 showed Multiple T2/flair hyperintense foci in the hemispheres, cerebellum and brainstem in a pattern and configuration consistent with  chronic demyelinating plaque associated with multiple sclerosis.  None of the foci appears to be acute.  Compared to the MRI dated 03/28/2018, there is no interval change.   There are no acute findings and a normal enhancement pattern.  MRI cervical spine 05/13/2018 showed no demyelinating plaques in the spinal coed.    C5-C7 fusion with metal hardware at C6-C7.     Mild multilevel degenerative changes as detailed above that do not lead to nerve root compression or spinal cord stenosis.  Unchanged from 2018 MRI.    MRI Brain 03/28/2018 T2/flair hyperintense foci in the left middle cerebellar peduncle, left pons and right cerebellar hemisphere.  None of these appear to be acute and they were all present on the previous MRI.  Additionally, there are T2/flair hyperintense foci in the juxtacortical, periventricular and deep white matter of both hemispheres.  None of these appear to be acute.  Some of the periventricular foci are radially oriented to the ventricles.  They were all present on the MRI from 07/02/2016. [name]  MRI cervical spine 03/28/2018 showed a normal spinal coed.    chronic C5-C7 fusion with metal hardware at C6-C7.     Mild multilevel mild degenerative changes without nerve root compression ir spinal compression    REVIEW OF SYSTEMS: Constitutional: No fevers, chills, sweats, or change in appetite.   Notes fatigue. Eyes: No visual changes, double vision, eye pain Ear, nose and throat: No hearing loss, ear pain, nasal congestion, sore throat Cardiovascular: No chest pain, palpitations Respiratory:  No shortness of breath at rest or with exertion.   No wheezes GastrointestinaI: No nausea, vomiting, diarrhea, abdominal pain, fecal incontinence Genitourinary:  No dysuria, urinary retention or frequency.  No nocturia. Musculoskeletal:  No neck pain, back pain Integumentary: No rash, pruritus, skin lesions Neurological: as above Psychiatric: No depression at this time.  Mild anxiety Endocrine:  No palpitations, diaphoresis, change in appetite, change in weigh or increased thirst Hematologic/Lymphatic:  No anemia, purpura, petechiae. Allergic/Immunologic: No itchy/runny eyes, nasal congestion, recent allergic reactions, rashes  ALLERGIES: No Known Allergies  HOME MEDICATIONS:  Current Outpatient Medications:    amphetamine-dextroamphetamine (ADDERALL) 20 MG tablet, Take 1 tablet in the morning and 1 around noon. Do not take after 3 pm., Disp: 60 tablet, Rfl: 0   OCREVUS 300 MG/10ML injection, Inject 20 mLs (600 mg total) into the vein every 6 (six) months., Disp: 20 mL, Rfl: 0   omeprazole (PRILOSEC) 40 MG capsule, TAKE 1 CAPSULE(40 MG) BY MOUTH DAILY, Disp: 90 capsule, Rfl: 3   VITAMIN D PO, Take 5,000 Units by mouth daily., Disp: , Rfl:    baclofen (LIORESAL)  10 MG tablet, Take 1 tablet (10 mg total) by mouth 2 (two) times daily. (Patient not taking: Reported on 07/16/2022), Disp: 30 each, Rfl: 0   HYDROcodone-acetaminophen (NORCO/VICODIN) 5-325 MG tablet, Take 1 tablet by mouth every 6 (six) hours as needed for moderate pain. (Patient not taking: Reported on 07/16/2022), Disp: 20 tablet, Rfl: 0   metoprolol tartrate (LOPRESSOR) 25 MG tablet, Take 1 tablet (25 mg total) by mouth 2 (two) times daily., Disp: 60 tablet, Rfl: 2 No current facility-administered medications for this visit.  Facility-Administered Medications Ordered in Other Visits:    gadopentetate dimeglumine (MAGNEVIST) injection 20 mL, 20 mL, Intravenous, Once PRN, Ariana Cavenaugh, Pearletha Furl, MD  PAST MEDICAL HISTORY: Past Medical History:  Diagnosis Date   Atrial fibrillation (HCC)    Hypertension    MS (multiple sclerosis) (HCC)    Vision abnormalities     PAST SURGICAL HISTORY: Past Surgical History:  Procedure Laterality Date   CERVICAL FUSION  2005, 2014    FAMILY HISTORY: Family History  Problem Relation Age of Onset   Lupus Mother    Healthy Father    Lymphoma Maternal Grandfather    Colon cancer  Paternal Grandmother    Alcohol abuse Paternal Uncle    Alcohol abuse Paternal Grandfather    Kidney cancer Neg Hx    Bladder Cancer Neg Hx    Prostate cancer Neg Hx     SOCIAL HISTORY:  Social History   Socioeconomic History   Marital status: Married    Spouse name: teresa   Number of children: 3   Years of education: Not on file   Highest education level: High school graduate  Occupational History   Not on file  Tobacco Use   Smoking status: Former    Current packs/day: 0.00    Types: Cigarettes    Quit date: 2003    Years since quitting: 21.6   Smokeless tobacco: Never  Vaping Use   Vaping status: Never Used  Substance and Sexual Activity   Alcohol use: Not Currently    Comment: social   Drug use: No   Sexual activity: Yes  Other Topics Concern   Not on file  Social History Narrative   Not on file   Social Determinants of Health   Financial Resource Strain: Low Risk  (11/12/2018)   Overall Financial Resource Strain (CARDIA)    Difficulty of Paying Living Expenses: Not hard at all  Food Insecurity: No Food Insecurity (11/12/2018)   Hunger Vital Sign    Worried About Running Out of Food in the Last Year: Never true    Ran Out of Food in the Last Year: Never true  Transportation Needs: No Transportation Needs (11/12/2018)   PRAPARE - Administrator, Civil Service (Medical): No    Lack of Transportation (Non-Medical): No  Physical Activity: Sufficiently Active (11/12/2018)   Exercise Vital Sign    Days of Exercise per Week: 4 days    Minutes of Exercise per Session: 60 min  Stress: No Stress Concern Present (11/12/2018)   [employer] of Occupational Health - Occupational Stress Questionnaire    Feeling of Stress : Only a little  Social Connections: Unknown (11/12/2018)   Social Connection and Isolation Panel [NHANES]    Frequency of Communication with Friends and Family: Not on file    Frequency of Social Gatherings with Friends and Family: Not  on file    Attends Religious Services: Never    Database administrator or Organizations:  No    Attends Club or Organization Meetings: Never    Marital Status: Married  Catering manager Violence: Not At Risk (11/12/2018)   Humiliation, Afraid, Rape, and Kick questionnaire    Fear of Current or Ex-Partner: No    Emotionally Abused: No    Physically Abused: No    Sexually Abused: No     PHYSICAL EXAM  Vitals:   11/28/22 1441 11/28/22 1445  BP: (!) 168/112 (!) 146/106  Pulse: 93 84  Weight: 276 lb 9.6 oz (125.5 kg)   Height: 6\' 1"  (1.854 m)     Body mass index is 36.49 kg/m.  Vision Screening   Right eye Left eye Both eyes  Without correction 20/30 20/40 20/30   With correction         General: The patient is well-developed and well-nourished and in no acute distress.     Neurologic Exam  Mental status:    The patient is alert and oriented x 3 at the time of the examination. The patient has apparent normal recent and remote memory, with an apparently normal attention span and concentration ability.   Speech is normal.  Cranial nerves: Extraocular movements are full.    Color vision is symmetric.  Facial strength is normal.  Trapezius and sternocleidomastoid strength is normal. No dysarthria is noted.  No obvious hearing deficits are noted.  Motor:  Muscle bulk is normal.   Tone is normal. Strength is 5/5 on the right but 4++/5 in biceps, APB and FDIO but 5/5 elsewhere.    Sensory: Sensory testing is altered in the palm and dorsum adjacent to thee second and third fingers..  leg sensation was symmetric  Coordination: Cerebellar testing shows good finger nose finger and mildly reduced left heel-to-shin.  Gait and station: Station is normal.  Gait is normal.  Tandem gait is slightly wide.  Romberg is negative.  Reflexes: Deep tendon reflexes are symmetric and 3 at knees and 2 elsewhere.  No ankle clonus.       DIAGNOSTIC DATA (LABS, IMAGING, TESTING) - I reviewed  patient records, labs, notes, testing and imaging myself where available.  Lab Results  Component Value Date   WBC 6.6 12/05/2021   HGB 15.5 12/05/2021   HCT 46.0 12/05/2021   MCV 89 12/05/2021   PLT 250 12/05/2021      Component Value Date/Time   NA 144 05/10/2021 1151   K 4.7 05/10/2021 1151   CL 103 05/10/2021 1151   CO2 25 05/10/2021 1151   GLUCOSE 76 05/10/2021 1151   GLUCOSE 125 (H) 09/24/2018 0428   BUN 16 05/10/2021 1151   CREATININE 0.99 05/10/2021 1151   CALCIUM 9.9 05/10/2021 1151   PROT 7.0 05/10/2021 1151   ALBUMIN 4.9 05/10/2021 1151   AST 8 05/10/2021 1151   ALT 12 05/10/2021 1151   ALKPHOS 61 05/10/2021 1151   BILITOT 0.3 05/10/2021 1151   GFRNONAA 103 12/29/2018 1121   GFRAA 119 12/29/2018 1121   _________________________  Multiple sclerosis (HCC) - Plan: IgG, IgA, IgM, CD20 B Cells  Vision changes  High risk medication use - Plan: IgG, IgA, IgM, CD20 B Cells  Essential hypertension   1.   Visual changes are better than this morning.  I am more concerned about visual changes from HTN than MS.   I will restart hos metoprolol and he sees PCP next month.  Advised to check BP in a few days.  We will hold Adderall until BP back into normal range.  2.  Continue Ocrevus.  His IgG has been a little low and I moved him from every 6 months to every 8 months.   His next infusion is scheduled in a few weeks.  I will check some lab work   3.   Hold Adderall for now due to BP.  Resume  Adderall 20 mg twice a day for ADD and fatigue once BP gets back to normal.   4.   Stay active and exercise as tolerated. 5.   He will return to see [place] in 6 months or sooner if there are new or worsening neurologic symptoms.

## 2022-11-30 LAB — IGG, IGA, IGM
IgA/Immunoglobulin A, Serum: 101 mg/dL (ref 90–386)
IgG (Immunoglobin G), Serum: 534 mg/dL — ABNORMAL LOW (ref 603–1613)
IgM (Immunoglobulin M), Srm: 22 mg/dL (ref 20–172)

## 2022-11-30 LAB — CD20 B CELLS
% CD19-B Cells: 0.1 % — ABNORMAL LOW (ref 4.6–22.1)
% CD20-B Cells: 0.1 % — ABNORMAL LOW (ref 5.0–22.3)

## 2022-12-07 ENCOUNTER — Other Ambulatory Visit: Payer: Self-pay | Admitting: Neurology

## 2022-12-07 DIAGNOSIS — G35 Multiple sclerosis: Secondary | ICD-10-CM

## 2022-12-13 ENCOUNTER — Other Ambulatory Visit: Payer: Self-pay | Admitting: *Deleted

## 2022-12-13 MED ORDER — OMEPRAZOLE 40 MG PO CPDR: 40.0000 mg | DELAYED_RELEASE_CAPSULE | Freq: Every day | ORAL | 3 refills | Status: DC

## 2023-01-01 ENCOUNTER — Other Ambulatory Visit: Payer: Self-pay | Admitting: Neurology

## 2023-01-01 MED ORDER — AMPHETAMINE-DEXTROAMPHETAMINE 20 MG PO TABS: ORAL_TABLET | ORAL | 0 refills | Status: DC

## 2023-01-01 NOTE — Telephone Encounter (Signed)
Last seen on 11/28/22 Follow up scheduled on 06/11/23 Last filled on 10/28/22 #60 tablets (30 day supply) Rx pending to be signed

## 2023-01-28 ENCOUNTER — Encounter: Payer: Self-pay | Admitting: Neurology

## 2023-02-14 ENCOUNTER — Telehealth: Payer: Self-pay | Admitting: Neurology

## 2023-02-14 ENCOUNTER — Ambulatory Visit: Payer: Self-pay | Admitting: Neurology

## 2023-02-14 NOTE — Telephone Encounter (Signed)
Pt cancelled appt due to death in the family. Pt decided not to reschedule due to have an appt scheduled with Dr. Epimenio Foot already in 2025.

## 2023-02-20 ENCOUNTER — Encounter: Payer: Self-pay | Admitting: Neurology

## 2023-04-22 ENCOUNTER — Other Ambulatory Visit: Payer: Self-pay | Admitting: Neurology

## 2023-04-24 MED ORDER — AMPHETAMINE-DEXTROAMPHETAMINE 20 MG PO TABS: ORAL_TABLET | ORAL | 0 refills | Status: DC

## 2023-06-03 ENCOUNTER — Other Ambulatory Visit: Payer: Self-pay | Admitting: Neurology

## 2023-06-04 MED ORDER — AMPHETAMINE-DEXTROAMPHETAMINE 20 MG PO TABS: ORAL_TABLET | ORAL | 0 refills | Status: DC

## 2023-06-04 NOTE — Telephone Encounter (Signed)
 Last seen 11/28/22 and next f/u 06/11/23. Last refilled 04/24/23 #60.   However per Dr. Bonnita Hollow last note 11/28/22: Hold Adderall for now due to BP.  Resume  Adderall 20 mg twice a day for ADD and fatigue once BP gets back to normal."

## 2023-06-11 ENCOUNTER — Encounter: Payer: Self-pay | Admitting: Neurology

## 2023-06-11 ENCOUNTER — Ambulatory Visit (INDEPENDENT_AMBULATORY_CARE_PROVIDER_SITE_OTHER): Payer: BC Managed Care – PPO | Admitting: Neurology

## 2023-06-11 VITALS — BP 140/98 | HR 94 | Ht 73.0 in | Wt 275.0 lb

## 2023-06-11 DIAGNOSIS — I10 Essential (primary) hypertension: Secondary | ICD-10-CM

## 2023-06-11 DIAGNOSIS — Z79899 Other long term (current) drug therapy: Secondary | ICD-10-CM

## 2023-06-11 DIAGNOSIS — R899 Unspecified abnormal finding in specimens from other organs, systems and tissues: Secondary | ICD-10-CM | POA: Diagnosis not present

## 2023-06-11 DIAGNOSIS — G35 Multiple sclerosis: Secondary | ICD-10-CM | POA: Diagnosis not present

## 2023-06-11 DIAGNOSIS — H469 Unspecified optic neuritis: Secondary | ICD-10-CM

## 2023-06-11 DIAGNOSIS — F988 Other specified behavioral and emotional disorders with onset usually occurring in childhood and adolescence: Secondary | ICD-10-CM

## 2023-06-11 MED ORDER — LOSARTAN POTASSIUM 50 MG PO TABS: 50.0000 mg | ORAL_TABLET | Freq: Every day | ORAL | 11 refills | Status: DC

## 2023-06-11 MED ORDER — BUPROPION HCL ER (XL) 150 MG PO TB24: 150.0000 mg | ORAL_TABLET | Freq: Every day | ORAL | 11 refills | Status: AC

## 2023-06-11 MED ORDER — AMPHETAMINE-DEXTROAMPHETAMINE 10 MG PO TABS: ORAL_TABLET | ORAL | 0 refills | Status: DC

## 2023-06-11 NOTE — Progress Notes (Signed)
 [facility]  PATIENT: Sean Espinoza DOB: 10/10/1975  REFERRING DOCTOR OR PCP:  Referred by Dr. Thad Ranger   SOURCE: paitent, ED notes, imaging reports, lab reports, MRI images on PACS  _________________________________   HISTORICAL  CHIEF COMPLAINT:  Chief Complaint  Patient presents with   Follow-up    Pt in 11 pt here for MS f/u Pt states no questions or concerns for today's visit Pt states eye exam 3 months ago     HISTORY OF PgRESENT ILLNESS:  Sean Espinoza is a 48 y.o. man  diagnosed with relapsing remitting multiple sclerosis in early 2018.    Update  06/11/2023 He is on Ocrevus 600 mg every 8 months for MS (next infusion is April 2025.  He feels much worse the last 3 months with more fatigue and generalized feeling weaker.  Additionally, it is difficult for him to take time off work for the infusions.  He has tolerated the actual infusions well and denies any exacerbation or new neurologic symptoms.   IgG has been mildly low (553 last check; IgM normal)  Gait is ok with mildly reduced balance.   He can go downstairs without using the bannister  He has mild left hand weakness and sometimes has some cramping in the thumb and index fingers.  Leg cramps have resolved.   He had cervical spine fusion x 2 (C5-C7 now)     Bladder is doing well.     He had some visual issues last visit but this seems stable.  BP was elevated that day/     He has some fatigue and decreased focus/attention.  Adderall helps   He is sleeping better than last year.   He notes he is more irritable.  Wellbutrin had helped him in the past.     Home sleep study 2020 that showed mild OSA with an AHI equals 12.8.     His wife noted that he is snoring less since losing a few pounds  He works for [employer] and does bending and  lifting but weight is usually not high.  He averages 10-15000 steps a day.    BP is elevated again today.  He had one episode of AFib around 2012 and was placed on  metoprolol.  He has not had anther episode.   MS History:    In mid March 2018, he had the onset of blurry vision out of the left eye. He noted that the visual field was worse on the left side than the right side. Additionally he was unable to read out of the left eye. Colors were mildly desaturated. On April 2, he had the onset of left facial numbness. Over the next day the numbness increased to involve the entire left side, worse in the arm than the leg. He also noted mild weakness in the left arm and reduced balance and gait.[name] He presented to [facility] and an MRI 07/02/2016 showed many T2/FLAIR hyperintense foci, four foci  enhanced  (right frontal subcortical white matter, right corpus callosum, left parietal subcortical white matter and left mesial temporal lobe), consistent with the diagnosis of MS. He was admitted and received 3 days of IV Solu-Medrol. His symptoms improved some and he was discharged.  He was referred to me and Gilford Raid was started in March 2018.   Due to an exacerbation 04/2017, he switched to Tysabri.    He had an exacerbation 05/2018 and he switched to [facility] 07/2018.    IMAGING: MRI 07/02/2016 showed  many T2/FLAIR hyperintense foci, four foci  enhanced  (right frontal subcortical white matter, right corpus callosum, left parietal subcortical white matter and left mesial temporal lobe),   MRI brain 05/13/2018 showed Multiple T2/flair hyperintense foci in the hemispheres, cerebellum and brainstem in a pattern and configuration consistent with chronic demyelinating plaque associated with multiple sclerosis.  None of the foci appears to be acute.  Compared to the MRI dated 03/28/2018, there is no interval change.   There are no acute findings and a normal enhancement pattern.  MRI cervical spine 05/13/2018 showed no demyelinating plaques in the spinal coed.    C5-C7 fusion with metal hardware at C6-C7.     Mild multilevel degenerative changes as detailed above that do not lead to  nerve root compression or spinal cord stenosis.  Unchanged from 2018 MRI.    MRI Brain 03/28/2018 T2/flair hyperintense foci in the left middle cerebellar peduncle, left pons and right cerebellar hemisphere.  None of these appear to be acute and they were all present on the previous MRI.  Additionally, there are T2/flair hyperintense foci in the juxtacortical, periventricular and deep white matter of both hemispheres.  None of these appear to be acute.  Some of the periventricular foci are radially oriented to the ventricles.  They were all present on the MRI from 07/02/2016. [name]  MRI cervical spine 03/28/2018 showed a normal spinal coed.    chronic C5-C7 fusion with metal hardware at C6-C7.     Mild multilevel mild degenerative changes without nerve root compression ir spinal compression    REVIEW OF SYSTEMS: Constitutional: No fevers, chills, sweats, or change in appetite.   Notes fatigue. Eyes: No visual changes, double vision, eye pain Ear, nose and throat: No hearing loss, ear pain, nasal congestion, sore throat Cardiovascular: No chest pain, palpitations Respiratory:  No shortness of breath at rest or with exertion.   No wheezes GastrointestinaI: No nausea, vomiting, diarrhea, abdominal pain, fecal incontinence Genitourinary:  No dysuria, urinary retention or frequency.  No nocturia. Musculoskeletal:  No neck pain, back pain Integumentary: No rash, pruritus, skin lesions Neurological: as above Psychiatric: No depression at this time.  Mild anxiety Endocrine: No palpitations, diaphoresis, change in appetite, change in weigh or increased thirst Hematologic/Lymphatic:  No anemia, purpura, petechiae. Allergic/Immunologic: No itchy/runny eyes, nasal congestion, recent allergic reactions, rashes  ALLERGIES: No Known Allergies  HOME MEDICATIONS:  Current Outpatient Medications:    buPROPion (WELLBUTRIN XL) 150 MG 24 hr tablet, Take 1 tablet (150 mg total) by mouth daily., Disp: 30 tablet, Rfl:  11   losartan (COZAAR) 50 MG tablet, Take 1 tablet (50 mg total) by mouth daily., Disp: 30 tablet, Rfl: 11   metoprolol tartrate (LOPRESSOR) 25 MG tablet, Take 1 tablet (25 mg total) by mouth 2 (two) times daily., Disp: 60 tablet, Rfl: 2   OCREVUS 300 MG/10ML injection, INFUSE 20 ML (600 MG) INTRAVENOUSLY EVERY 6 MONTHS. May require reconstitution/dilution refer to package insert or medical order., Disp: 20 mL, Rfl: 1   omeprazole (PRILOSEC) 40 MG capsule, Take 1 capsule (40 mg total) by mouth daily., Disp: 90 capsule, Rfl: 3   VITAMIN D PO, Take 5,000 Units by mouth daily., Disp: , Rfl:    amphetamine-dextroamphetamine (ADDERALL) 10 MG tablet, Take 2 po in the morning and 1 po around noon., Disp: 90 tablet, Rfl: 0   baclofen (LIORESAL) 10 MG tablet, Take 1 tablet (10 mg total) by mouth 2 (two) times daily. (Patient not taking: Reported on 07/16/2022), Disp: 30  each, Rfl: 0   HYDROcodone-acetaminophen (NORCO/VICODIN) 5-325 MG tablet, Take 1 tablet by mouth every 6 (six) hours as needed for moderate pain. (Patient not taking: Reported on 07/16/2022), Disp: 20 tablet, Rfl: 0 No current facility-administered medications for this visit.  Facility-Administered Medications Ordered in Other Visits:    gadopentetate dimeglumine (MAGNEVIST) injection 20 mL, 20 mL, Intravenous, Once PRN, Kashtyn Jankowski, Pearletha Furl, MD  PAST MEDICAL HISTORY: Past Medical History:  Diagnosis Date   Atrial fibrillation (HCC)    Hypertension    MS (multiple sclerosis) (HCC)    Vision abnormalities     PAST SURGICAL HISTORY: Past Surgical History:  Procedure Laterality Date   CERVICAL FUSION  2005, 2014    FAMILY HISTORY: Family History  Problem Relation Age of Onset   Lupus Mother    Healthy Father    Alcohol abuse Paternal Uncle    Lymphoma Maternal Grandfather    Colon cancer Paternal Grandmother    Alcohol abuse Paternal Grandfather    Kidney cancer Neg Hx    Bladder Cancer Neg Hx    Prostate cancer Neg Hx     Multiple sclerosis Neg Hx     SOCIAL HISTORY:  Social History   Socioeconomic History   Marital status: Married    Spouse name: teresa   Number of children: 3   Years of education: Not on file   Highest education level: High school graduate  Occupational History   Not on file  Tobacco Use   Smoking status: Former    Current packs/day: 0.00    Types: Cigarettes    Quit date: 2003    Years since quitting: 22.2   Smokeless tobacco: Never  Vaping Use   Vaping status: Never Used  Substance and Sexual Activity   Alcohol use: Not Currently    Comment: social   Drug use: No   Sexual activity: Yes  Other Topics Concern   Not on file  Social History Narrative   Pt works    Social Drivers of Corporate investment banker Strain: Low Risk  (11/12/2018)   Overall Financial Resource Strain (CARDIA)    Difficulty of Paying Living Expenses: Not hard at all  Food Insecurity: No Food Insecurity (11/12/2018)   Hunger Vital Sign    Worried About Running Out of Food in the Last Year: Never true    Ran Out of Food in the Last Year: Never true  Transportation Needs: No Transportation Needs (11/12/2018)   PRAPARE - Administrator, Civil Service (Medical): No    Lack of Transportation (Non-Medical): No  Physical Activity: Sufficiently Active (11/12/2018)   Exercise Vital Sign    Days of Exercise per Week: 4 days    Minutes of Exercise per Session: 60 min  Stress: No Stress Concern Present (11/12/2018)   [employer] of Occupational Health - Occupational Stress Questionnaire    Feeling of Stress : Only a little  Social Connections: Unknown (11/12/2018)   Social Connection and Isolation Panel [NHANES]    Frequency of Communication with Friends and Family: Not on file    Frequency of Social Gatherings with Friends and Family: Not on file    Attends Religious Services: Never    Active Member of Clubs or Organizations: No    Attends Banker Meetings: Never     Marital Status: Married  Catering manager Violence: Not At Risk (11/12/2018)   Humiliation, Afraid, Rape, and Kick questionnaire    Fear of Current or Ex-Partner: No  Emotionally Abused: No    Physically Abused: No    Sexually Abused: No     PHYSICAL EXAM  Vitals:   06/11/23 1536  BP: (!) 140/98  Pulse: 94  Weight: 275 lb (124.7 kg)  Height: 6\' 1"  (1.854 m)    Body mass index is 36.28 kg/m.  No results found.     General: The patient is well-developed and well-nourished and in no acute distress.     Neurologic Exam  Mental status:    The patient is alert and oriented x 3 at the time of the examination. The patient has apparent normal recent and remote memory, with an apparently normal attention span and concentration ability.   Speech is normal.  Cranial nerves: Extraocular movements are full.    Color vision is symmetric.  Facial strength is normal.  Trapezius and sternocleidomastoid strength is normal. No dysarthria is noted.  No obvious hearing deficits are noted.  Motor:  Muscle bulk is normal.   Tone is normal. Strength is 5/5 on the right but 4++/5 in biceps, APB and FDIO but 5/5 elsewhere.    Sensory: Sensory testing is altered in the palm and dorsum adjacent to thee second and third fingers..  leg sensation was symmetric  Coordination: Cerebellar testing shows good finger nose finger and mildly reduced left heel-to-shin.  Gait and station: Station is normal.  Gait is normal.  Tandem gait is slightly wide.  Romberg is negative.  Reflexes: Deep tendon reflexes are symmetric and 3 at knees and 2 elsewhere.  There is no ankle clonus    DIAGNOSTIC DATA (LABS, IMAGING, TESTING) - I reviewed patient records, labs, notes, testing and imaging myself where available.  Lab Results  Component Value Date   WBC 6.6 12/05/2021   HGB 15.5 12/05/2021   HCT 46.0 12/05/2021   MCV 89 12/05/2021   PLT 250 12/05/2021      Component Value Date/Time   NA 144 05/10/2021  1151   K 4.7 05/10/2021 1151   CL 103 05/10/2021 1151   CO2 25 05/10/2021 1151   GLUCOSE 76 05/10/2021 1151   GLUCOSE 125 (H) 09/24/2018 0428   BUN 16 05/10/2021 1151   CREATININE 0.99 05/10/2021 1151   CALCIUM 9.9 05/10/2021 1151   PROT 7.0 05/10/2021 1151   ALBUMIN 4.9 05/10/2021 1151   AST 8 05/10/2021 1151   ALT 12 05/10/2021 1151   ALKPHOS 61 05/10/2021 1151   BILITOT 0.3 05/10/2021 1151   GFRNONAA 103 12/29/2018 1121   GFRAA 119 12/29/2018 1121   _________________________  Multiple sclerosis (HCC) - Plan: Hepatitis B Core AB, Total, Hep B Surface Antigen, IgG, IgA, IgM, CBC with Differential/Platelet  High risk medication use - Plan: Hepatitis B Core AB, Total, Hep B Surface Antigen, IgG, IgA, IgM, CBC with Differential/Platelet  Abnormal laboratory test - Plan: Hepatitis B Core AB, Total, Hep B Surface Antigen, IgG, IgA, IgM, CBC with Differential/Platelet  Left optic neuritis  Attention deficit disorder (ADD) in adult  Essential hypertension   1.  He would like to switch off Ocrevus to another medication.  We discussed Kesimpta injections.  This will allow him to do the injections without having to take time off work for the infusions.  We did discuss that if the IgG drops further I would change his dosing to every 6 weeks after his load  2.   Change Adderall to 20 mg qAM and 10 mg q noon 3.   Stay active and exercise as tolerated. 4.  I added losartan for his hypertension.  He is advised to find a primary care provider.   He will return to see [place] in 6 months or sooner if there are new or worsening neurologic symptoms.

## 2023-06-12 ENCOUNTER — Encounter: Payer: Self-pay | Admitting: Neurology

## 2023-06-12 LAB — HEPATITIS B CORE ANTIBODY, TOTAL: Hep B Core Total Ab: NEGATIVE

## 2023-06-12 LAB — CBC WITH DIFFERENTIAL/PLATELET
Basophils Absolute: 0 10*3/uL (ref 0.0–0.2)
Basos: 1 %
EOS (ABSOLUTE): 0.2 10*3/uL (ref 0.0–0.4)
Eos: 3 %
Hematocrit: 48 % (ref 37.5–51.0)
Hemoglobin: 16.1 g/dL (ref 13.0–17.7)
Immature Grans (Abs): 0 10*3/uL (ref 0.0–0.1)
Immature Granulocytes: 0 %
Lymphocytes Absolute: 2.2 10*3/uL (ref 0.7–3.1)
Lymphs: 31 %
MCH: 29.4 pg (ref 26.6–33.0)
MCHC: 33.5 g/dL (ref 31.5–35.7)
MCV: 88 fL (ref 79–97)
Monocytes Absolute: 0.7 10*3/uL (ref 0.1–0.9)
Monocytes: 10 %
Neutrophils Absolute: 4 10*3/uL (ref 1.4–7.0)
Neutrophils: 55 %
Platelets: 296 10*3/uL (ref 150–450)
RBC: 5.48 x10E6/uL (ref 4.14–5.80)
RDW: 13 % (ref 11.6–15.4)
WBC: 7.1 10*3/uL (ref 3.4–10.8)

## 2023-06-12 LAB — HEPATITIS B SURFACE ANTIGEN: Hepatitis B Surface Ag: NEGATIVE

## 2023-06-12 LAB — IGG, IGA, IGM
IgA/Immunoglobulin A, Serum: 104 mg/dL (ref 90–386)
IgG (Immunoglobin G), Serum: 563 mg/dL — ABNORMAL LOW (ref 603–1613)
IgM (Immunoglobulin M), Srm: 17 mg/dL — ABNORMAL LOW (ref 20–172)

## 2023-06-18 ENCOUNTER — Telehealth: Payer: Self-pay | Admitting: *Deleted

## 2023-06-18 NOTE — Telephone Encounter (Signed)
Faxed completed/signed Kesimpta start form to [facility] at [phone]. Received fax confirmation.  

## 2023-06-18 NOTE — Telephone Encounter (Signed)
 Completed PA for Kesimpta via CMM. Sent to [employer]. Key: [id number]. It was approved.  "CaseId:[id number];Status:Approved;Review Type:Prior Auth;Coverage Start Date:05/19/2023;Coverage End Date:06/17/2024;"

## 2023-07-22 NOTE — Addendum Note (Signed)
 Addended by: Oneita Bihari on: 07/22/2023 03:20 PM   Modules accepted: Orders

## 2023-07-22 NOTE — Telephone Encounter (Signed)
 Called pt at [phone].  He has started Kesimpta. Doing third loading dose tomorrow and then will start monthly injections after that. Tolerating ok. Did end up getting sick last Thursday but improving. Next f/u 01/14/24 at 4pm with Dr. Godwin Lat. He will plan to keep this as scheduled and will call if he develops any new/worsening sx prior to this appt.

## 2023-08-05 ENCOUNTER — Other Ambulatory Visit: Payer: Self-pay | Admitting: Neurology

## 2023-08-06 MED ORDER — AMPHETAMINE-DEXTROAMPHETAMINE 10 MG PO TABS: ORAL_TABLET | ORAL | 0 refills | Status: DC

## 2023-08-15 ENCOUNTER — Ambulatory Visit (INDEPENDENT_AMBULATORY_CARE_PROVIDER_SITE_OTHER)

## 2023-08-15 ENCOUNTER — Encounter: Payer: Self-pay | Admitting: Podiatry

## 2023-08-15 ENCOUNTER — Ambulatory Visit (INDEPENDENT_AMBULATORY_CARE_PROVIDER_SITE_OTHER): Admitting: Podiatry

## 2023-08-15 DIAGNOSIS — M722 Plantar fascial fibromatosis: Secondary | ICD-10-CM

## 2023-08-15 DIAGNOSIS — M21861 Other specified acquired deformities of right lower leg: Secondary | ICD-10-CM | POA: Diagnosis not present

## 2023-08-15 MED ORDER — BETAMETHASONE SOD PHOS & ACET 6 (3-3) MG/ML IJ SUSP
3.0000 mg | Freq: Once | INTRAMUSCULAR | Status: AC
  Administered 2023-08-15: 3 mg via INTRA_ARTICULAR

## 2023-08-15 MED ORDER — MELOXICAM 15 MG PO TABS: 15.0000 mg | ORAL_TABLET | Freq: Every day | ORAL | 1 refills | Status: AC

## 2023-08-15 MED ORDER — METHYLPREDNISOLONE 4 MG PO TBPK: ORAL_TABLET | ORAL | 0 refills | Status: DC

## 2023-08-15 NOTE — Patient Instructions (Signed)
 Recovery slides available on [employer]- OOFOS, new balance, Hoka, crocs

## 2023-08-15 NOTE — Progress Notes (Signed)
   Chief Complaint  Patient presents with   Foot Pain    "My heel hurts." N - heel pain L - plantar heel D - 6-8 mos. O - suddenly, gotten worse C - sharp pain, really sore A - sit down for any length of time T - otc inserts, brace    Subjective: 48 y.o. male presenting today for new onset of pain and tenderness ongoing for about 8 months.  Idiopathic gradual onset.  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  He has tried OTC inserts and ankle brace with no improvement.   Past Medical History:  Diagnosis Date   Atrial fibrillation (HCC)    Hypertension    MS (multiple sclerosis) (HCC)    Vision abnormalities    Past Surgical History:  Procedure Laterality Date   CERVICAL FUSION  2005, 2014   No Known Allergies   Objective: Physical Exam General: The patient is alert and oriented x3 in no acute distress.  Dermatology: Skin is warm, dry and supple bilateral lower extremities. Negative for open lesions or macerations bilateral.   Vascular: Dorsalis Pedis and Posterior Tibial pulses palpable bilateral.  Capillary fill time is immediate to all digits.  Neurological: Grossly intact via light touch  Musculoskeletal: Tenderness to palpation to the plantar aspect of the right heel along the plantar fascia. All other joints range of motion within normal limits bilateral. Strength 5/5 in all groups bilateral.  There is some ankle joint limited range of motion consistent with a tight posterior complex and equinus  Radiographic exam RT foot 08/15/2023: Normal osseous mineralization. Joint spaces preserved. No fracture/dislocation/boney destruction. No other soft tissue abnormalities or radiopaque foreign bodies.  Posterior heel spur noted which is asymptomatic with palpation  Assessment: 1. Plantar fasciitis right 2.  Equinus deformity right  Plan of Care:  -Patient evaluated. Xrays reviewed.   -Injection 0.5 cc Celestone Soluspan injected in the plantar fascia  right -Prescription for Medrol  Dosepak -Prescription for meloxicam 15 mg daily after completion of the Dosepak -Advised against going barefoot.  Recommend good supportive shoes and sneakers.  Recommended recovery slides; OOFOS, Hoka, new balance, or crocs recovery slides all available on [employer] -Recommended [employer] running store -Recommend daily stretching exercises to stretch the posterior aspect of the leg and calf to alleviate any equinus type deformity -Plantar fascia brace dispensed.  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 Dot Gazella, DPM    [address] [place], [place] [address]                Office [phone]  Fax [phone]

## 2023-09-11 ENCOUNTER — Other Ambulatory Visit: Payer: Self-pay | Admitting: Neurology

## 2023-09-11 ENCOUNTER — Other Ambulatory Visit: Payer: Self-pay

## 2023-09-11 MED ORDER — AMPHETAMINE-DEXTROAMPHETAMINE 10 MG PO TABS: ORAL_TABLET | ORAL | 0 refills | Status: DC

## 2023-09-11 NOTE — Telephone Encounter (Signed)
Pt is requesting a refill for amphetamine-dextroamphetamine (ADDERALL) 20 MG tablet.  Pharmacy: [facility] [phone]

## 2023-09-11 NOTE — Telephone Encounter (Signed)
 Pt last seen 06/11/2023 Upcoming Appointment 01/14/2024   Adderall last filled 08/06/2023 Escript 09/11/2023

## 2023-09-26 ENCOUNTER — Encounter: Payer: Self-pay | Admitting: Podiatry

## 2023-09-26 ENCOUNTER — Ambulatory Visit (INDEPENDENT_AMBULATORY_CARE_PROVIDER_SITE_OTHER): Admitting: Podiatry

## 2023-09-26 VITALS — Ht 73.0 in | Wt 275.0 lb

## 2023-09-26 DIAGNOSIS — M722 Plantar fascial fibromatosis: Secondary | ICD-10-CM

## 2023-09-26 NOTE — Progress Notes (Signed)
   Chief Complaint  Patient presents with   Foot Pain    Pt is here to f/u on right foot pain, he states his foot is a lot better.    Subjective: 48 y.o. male presenting today for follow-up evaluation of plantar fasciitis bilateral.  He says that he feels significantly better.  He no longer has any pain or tenderness.  Overall very satisfied   Past Medical History:  Diagnosis Date   Atrial fibrillation (HCC)    Hypertension    MS (multiple sclerosis) (HCC)    Vision abnormalities    Past Surgical History:  Procedure Laterality Date   CERVICAL FUSION  2005, 2014   No Known Allergies   Objective: Physical Exam General: The patient is alert and oriented x3 in no acute distress.  Dermatology: Skin is warm, dry and supple bilateral lower extremities. Negative for open lesions or macerations bilateral.   Vascular: Dorsalis Pedis and Posterior Tibial pulses palpable bilateral.  Capillary fill time is immediate to all digits.  Neurological: Grossly intact via light touch  Musculoskeletal: Negative for any appreciable tenderness to palpation to the plantar aspect of the right heel along the plantar fascia. All other joints range of motion within normal limits bilateral. Strength 5/5 in all groups bilateral.  There is some ankle joint limited range of motion consistent with a tight posterior complex and equinus  Radiographic exam RT foot 08/15/2023: Normal osseous mineralization. Joint spaces preserved. No fracture/dislocation/boney destruction. No other soft tissue abnormalities or radiopaque foreign bodies.  Posterior heel spur noted which is asymptomatic with palpation  Assessment: 1. Plantar fasciitis right 2.  Equinus deformity right  Plan of Care:  -Patient evaluated.  -Overall significant improvement -Continue good supportive tennis shoes and sneakers.  Continue to refrain from going barefoot.  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 Thresa EMERSON Sar, DPM    [address] [place], [place] [address]                Office [phone]  Fax [phone]

## 2023-10-09 ENCOUNTER — Other Ambulatory Visit: Payer: Self-pay | Admitting: Neurology

## 2023-10-09 NOTE — Telephone Encounter (Signed)
 Last seen on 06/11/23 Follow up scheduled on 01/14/24  Did you want to continue filling or did you want patient to follow up with PCP?

## 2023-10-13 ENCOUNTER — Other Ambulatory Visit: Payer: Self-pay | Admitting: Neurology

## 2023-10-13 MED ORDER — AMPHETAMINE-DEXTROAMPHETAMINE 10 MG PO TABS: ORAL_TABLET | ORAL | 0 refills | Status: DC

## 2023-10-13 NOTE — Telephone Encounter (Signed)
 Last seen 06/11/23 and next f/u 01/14/24. Last refilled 09/11/23 #90.

## 2023-10-13 NOTE — Telephone Encounter (Signed)
 Pt called stating that he is needing a refill request sent in for his amphetamine -dextroamphetamine  (ADDERALL) 10 MG tablet to the [facility] in [place].

## 2023-11-13 ENCOUNTER — Encounter: Payer: Self-pay | Admitting: Neurology

## 2023-11-13 MED ORDER — AMPHETAMINE-DEXTROAMPHETAMINE 10 MG PO TABS: ORAL_TABLET | ORAL | 0 refills | Status: DC

## 2023-11-13 NOTE — Telephone Encounter (Signed)
 Last seen on 06/11/23 Follow up scheduled on 01/14/24   Dispensed Days Supply Quantity Provider Pharmacy  D-AMPHETAMINE  SALT COMBO 10MG  TAB 10/13/2023 30 90 each Sater, Charlie LABOR, MD [facility] #...     Rx pending to be signed

## 2023-11-25 ENCOUNTER — Other Ambulatory Visit: Payer: Self-pay | Admitting: *Deleted

## 2023-11-25 DIAGNOSIS — G35 Multiple sclerosis: Secondary | ICD-10-CM

## 2023-11-25 MED ORDER — KESIMPTA 20 MG/0.4ML ~~LOC~~ SOAJ: 0.4000 mL | SUBCUTANEOUS | 1 refills | Status: AC

## 2023-12-13 IMAGING — CR DG LUMBAR SPINE COMPLETE 4+V
6 series · 6 of 6 positions shown · non-contrast
Comparison: MRI lumbar spine dated November 25, 2016.

CLINICAL DATA: Left lower back pain radiating to the hips.

EXAM:
LUMBAR SPINE - COMPLETE 4+ VIEW

[l-spine ap (1 of 2)]
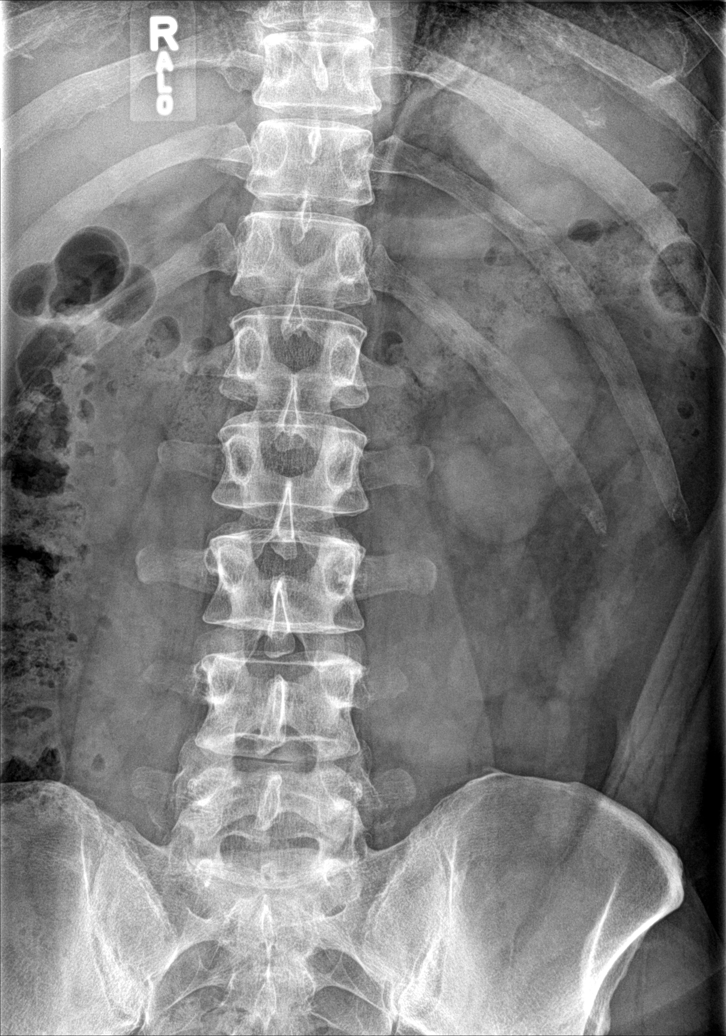

[l-spine obl (1 of 2)]
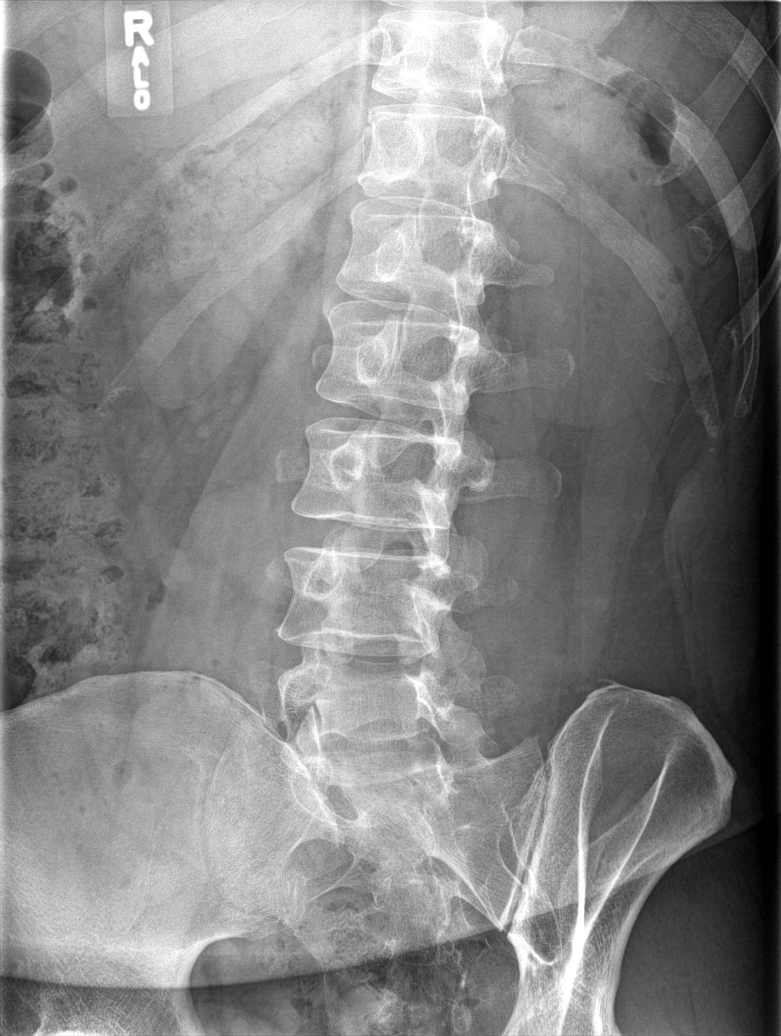

[l-spine obl (2 of 2)]
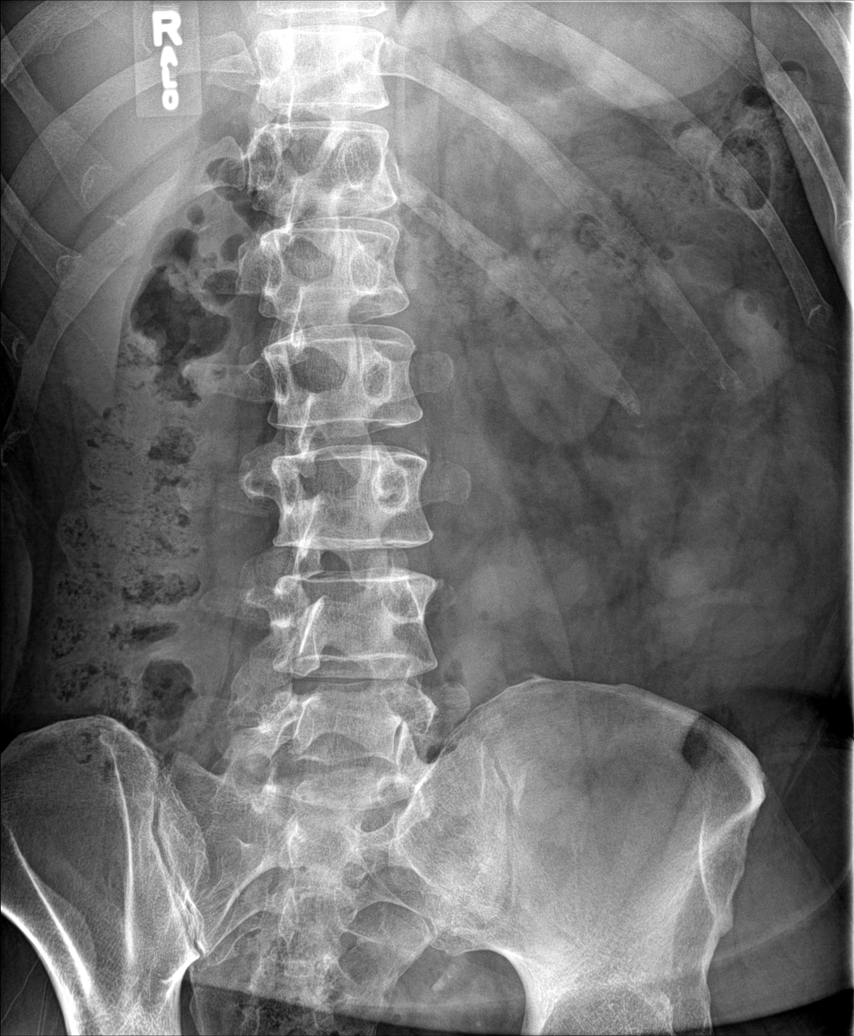

[l-spine lat]
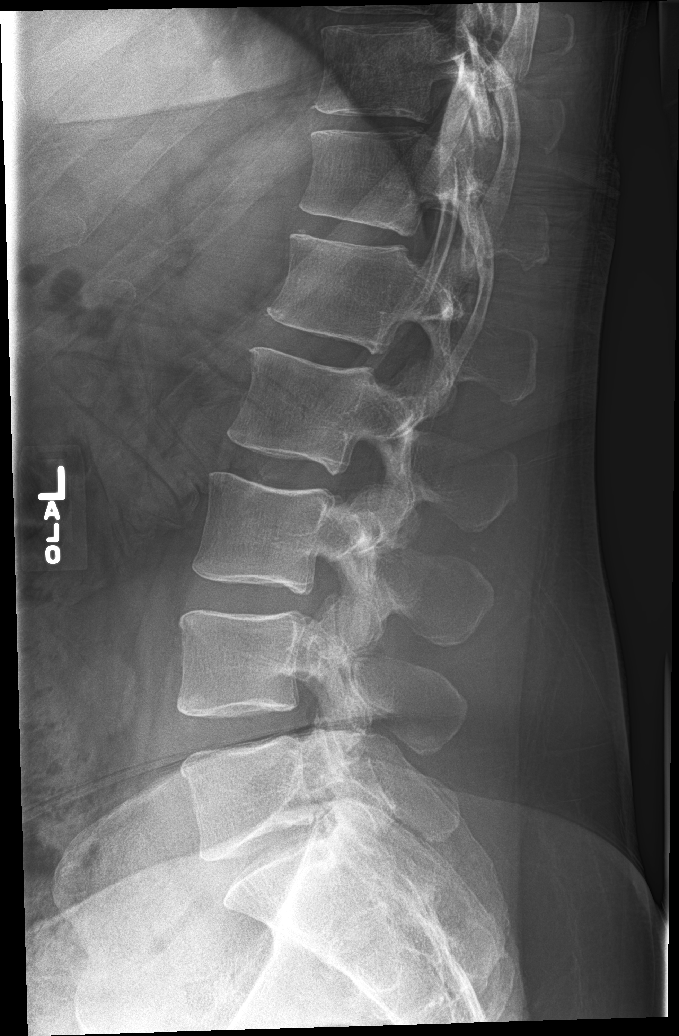

[l-spine spot]
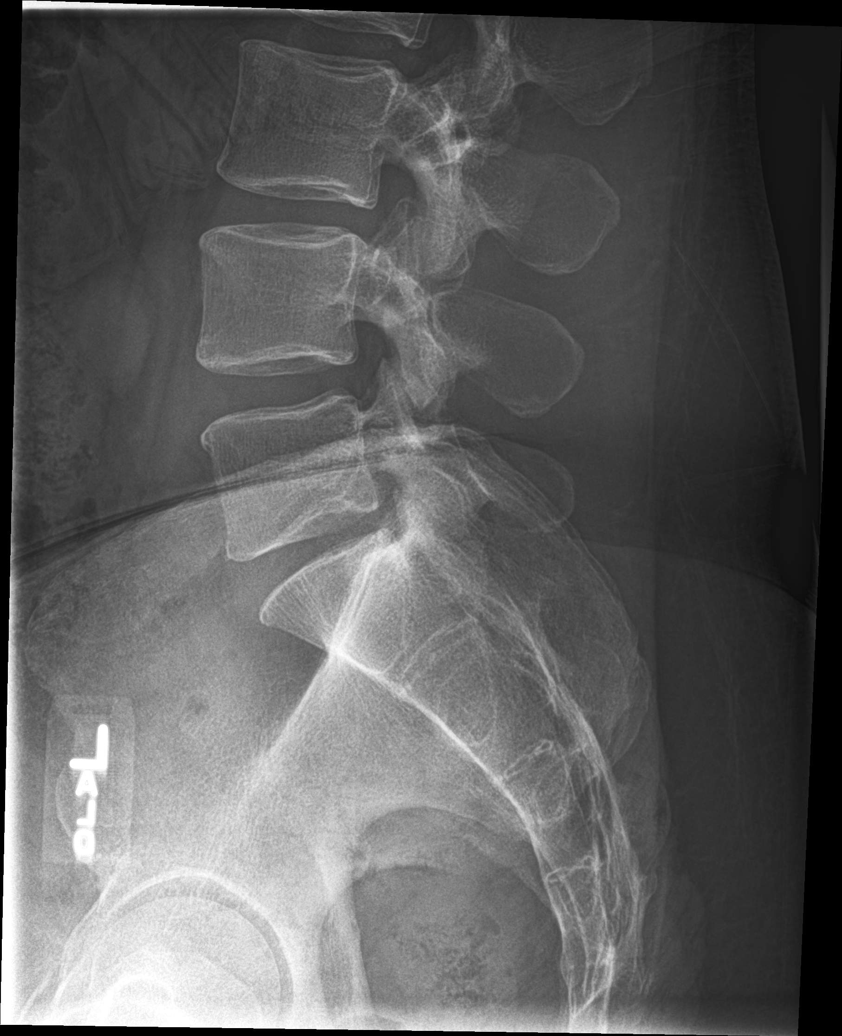

[l-spine ap (2 of 2)]
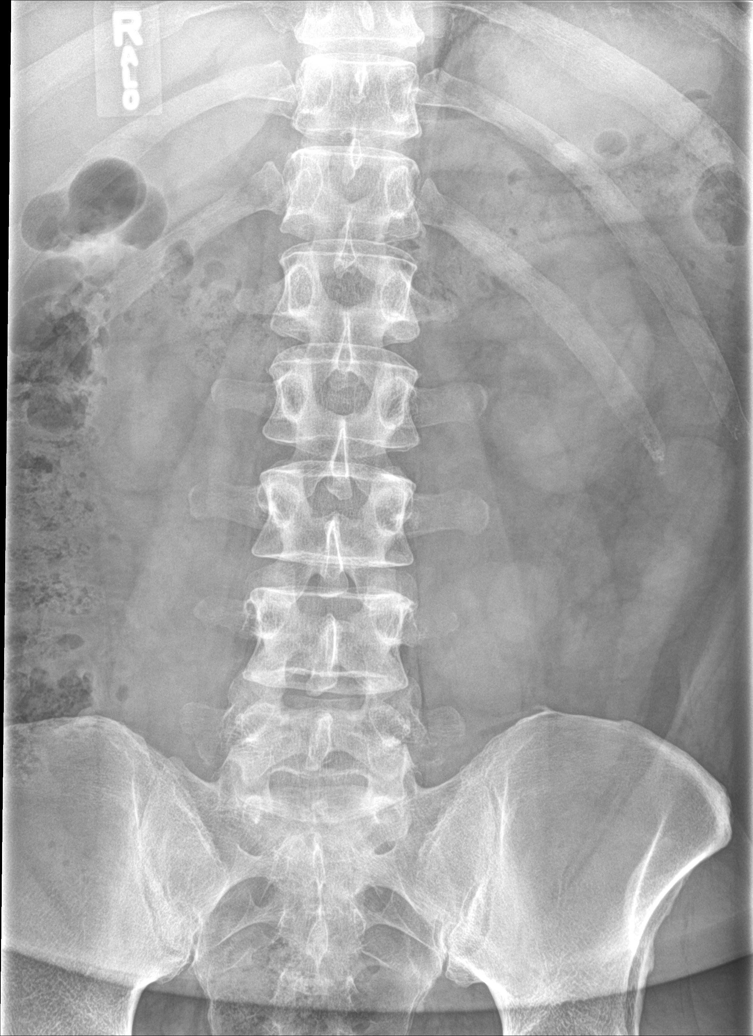

[6 of 6 positions shown; findings below may reference images not displayed]

FINDINGS: Five lumbar type vertebral bodies.

No acute fracture or subluxation. Vertebral body heights are
preserved.

Alignment is normal.

Unchanged mild disc height loss at L5-S1. Remaining intervertebral
disc spaces are maintained.

The sacroiliac joints are unremarkable.
IMPRESSION: 1.  No acute osseous abnormality.
2. Unchanged mild degenerative disc disease at L5-S1.

## 2023-12-17 ENCOUNTER — Telehealth: Payer: Self-pay | Admitting: Neurology

## 2023-12-17 MED ORDER — AMPHETAMINE-DEXTROAMPHETAMINE 10 MG PO TABS: ORAL_TABLET | ORAL | 0 refills | Status: DC

## 2023-12-17 NOTE — Telephone Encounter (Signed)
 Last Seen 06/11/2023 Upcoming Appointment 01/14/2024  Adderall Last filled 11/13/2023

## 2023-12-17 NOTE — Telephone Encounter (Signed)
Pt is requesting a refill for amphetamine-dextroamphetamine (ADDERALL) 20 MG tablet.  Pharmacy: [facility] [phone]

## 2024-01-14 ENCOUNTER — Ambulatory Visit: Admitting: Neurology

## 2024-01-14 ENCOUNTER — Encounter: Payer: Self-pay | Admitting: Neurology

## 2024-01-14 ENCOUNTER — Other Ambulatory Visit: Payer: Self-pay | Admitting: Neurology

## 2024-01-14 VITALS — BP 160/97 | HR 68 | Ht 73.0 in | Wt 266.0 lb

## 2024-01-14 DIAGNOSIS — H469 Unspecified optic neuritis: Secondary | ICD-10-CM | POA: Diagnosis not present

## 2024-01-14 DIAGNOSIS — I10 Essential (primary) hypertension: Secondary | ICD-10-CM

## 2024-01-14 DIAGNOSIS — Z79899 Other long term (current) drug therapy: Secondary | ICD-10-CM | POA: Diagnosis not present

## 2024-01-14 DIAGNOSIS — G5603 Carpal tunnel syndrome, bilateral upper limbs: Secondary | ICD-10-CM

## 2024-01-14 DIAGNOSIS — G35D Multiple sclerosis, unspecified: Secondary | ICD-10-CM | POA: Diagnosis not present

## 2024-01-14 DIAGNOSIS — R899 Unspecified abnormal finding in specimens from other organs, systems and tissues: Secondary | ICD-10-CM | POA: Diagnosis not present

## 2024-01-14 DIAGNOSIS — F988 Other specified behavioral and emotional disorders with onset usually occurring in childhood and adolescence: Secondary | ICD-10-CM

## 2024-01-14 MED ORDER — LOSARTAN POTASSIUM 100 MG PO TABS: 100.0000 mg | ORAL_TABLET | Freq: Every day | ORAL | 3 refills | Status: AC

## 2024-01-14 MED ORDER — AMPHETAMINE-DEXTROAMPHETAMINE 10 MG PO TABS: ORAL_TABLET | ORAL | 0 refills | Status: DC

## 2024-01-14 NOTE — Telephone Encounter (Signed)
 Last seen on 06/11/23 Follow up scheduled on 01/14/24  Dr.Sater approved Rx on 10/09/23 for lopressor 

## 2024-01-14 NOTE — Progress Notes (Signed)
 [facility]  PATIENT: Sean Espinoza DOB: 1975-10-24  REFERRING DOCTOR OR PCP:  Referred by Dr. Germaine   SOURCE: paitent, ED notes, imaging reports, lab reports, MRI images on PACS  _________________________________   HISTORICAL  CHIEF COMPLAINT:  Chief Complaint  Patient presents with   Follow-up    Pt in room 10. Alone. Here for MS follow up.    HISTORY OF PgRESENT ILLNESS:  Sean Espinoza is a 48 y.o. man  diagnosed with relapsing remitting multiple sclerosis in early 2018.    Update  01/14/2024 He is on Kesimpta  monthly.  He tolerates these better than the Ocrevus .  He denies any exacerbation or new neurologic symptoms.   IgG has been mildly low on Ocrevus .  He feels neurologically stable.  Gait is usually fine with occasional reduced balance.   No falls. He can go downstairs without using the bannister  .  Leg cramps have resolved.    He has intermittent numbess in the right hand, motly fingertips of first 4 fingers, splitting the ring finger.    Bladder is doing well.     He had some visual issues last visit but this seems stable.    BP was elevated today  He is on losartan .  10 years ago was on metoprolol  after an arrhythmia event but not on x years   He has not gained any weight.  He reports traffic was heavy on his way in.    He has some fatigue and decreased focus/attention.   Adderall helps the MS related ADD and the fatiguw.     He is sleeping better than last year.   He notes he is more irritable.  Wellbutrin  had helped him in the past.     Home sleep study 2020 that showed mild OSA with an AHI equals 12.8.    He denies significant excessive daytime sleepiness  He works for [employer] and does bending and  lifting but weight is usually not high.  He averages 10-15000 steps a day.     He had cervical spine fusion x 2 (C5-C7 now)    MS History:    In mid March 2018, he had the onset of blurry vision out of the left eye. He noted that  the visual field was worse on the left side than the right side. Additionally he was unable to read out of the left eye. Colors were mildly desaturated. On April 2, he had the onset of left facial numbness. Over the next day the numbness increased to involve the entire left side, worse in the arm than the leg. He also noted mild weakness in the left arm and reduced balance and gait.SABRA He presented to [facility] and an MRI 07/02/2016 showed many T2/FLAIR hyperintense foci, four foci  enhanced  (right frontal subcortical white matter, right corpus callosum, left parietal subcortical white matter and left mesial temporal lobe), consistent with the diagnosis of MS. He was admitted and received 3 days of IV Solu-Medrol . His symptoms improved some and he was discharged.  He was referred to me and Bridget was started in March 2018.   Due to an exacerbation 04/2017, he switched to Tysabri.    He had an exacerbation 05/2018 and he switched to Ocrevus  07/2018.    IMAGING: MRI 07/02/2016 showed many T2/FLAIR hyperintense foci, four foci  enhanced  (right frontal subcortical white matter, right corpus callosum, left parietal subcortical white matter and left mesial temporal lobe),   MRI brain 05/13/2018  showed Multiple T2/flair hyperintense foci in the hemispheres, cerebellum and brainstem in a pattern and configuration consistent with chronic demyelinating plaque associated with multiple sclerosis.  None of the foci appears to be acute.  Compared to the MRI dated 03/28/2018, there is no interval change.   There are no acute findings and a normal enhancement pattern.  MRI cervical spine 05/13/2018 showed no demyelinating plaques in the spinal coed.    C5-C7 fusion with metal hardware at C6-C7.     Mild multilevel degenerative changes as detailed above that do not lead to nerve root compression or spinal cord stenosis.  Unchanged from 2018 MRI.    MRI Brain 03/28/2018 T2/flair hyperintense foci in the left middle  cerebellar peduncle, left pons and right cerebellar hemisphere.  None of these appear to be acute and they were all present on the previous MRI.  Additionally, there are T2/flair hyperintense foci in the juxtacortical, periventricular and deep white matter of both hemispheres.  None of these appear to be acute.  Some of the periventricular foci are radially oriented to the ventricles.  They were all present on the MRI from 07/02/2016. SABRA  MRI cervical spine 03/28/2018 showed a normal spinal coed.    chronic C5-C7 fusion with metal hardware at C6-C7.     Mild multilevel mild degenerative changes without nerve root compression ir spinal compression    REVIEW OF SYSTEMS: Constitutional: No fevers, chills, sweats, or change in appetite.   Notes fatigue. Eyes: No visual changes, double vision, eye pain Ear, nose and throat: No hearing loss, ear pain, nasal congestion, sore throat Cardiovascular: No chest pain, palpitations Respiratory:  No shortness of breath at rest or with exertion.   No wheezes GastrointestinaI: No nausea, vomiting, diarrhea, abdominal pain, fecal incontinence Genitourinary:  No dysuria, urinary retention or frequency.  No nocturia. Musculoskeletal:  No neck pain, back pain Integumentary: No rash, pruritus, skin lesions Neurological: as above Psychiatric: No depression at this time.  Mild anxiety Endocrine: No palpitations, diaphoresis, change in appetite, change in weigh or increased thirst Hematologic/Lymphatic:  No anemia, purpura, petechiae. Allergic/Immunologic: No itchy/runny eyes, nasal congestion, recent allergic reactions, rashes  ALLERGIES: No Known Allergies  HOME MEDICATIONS:  Current Outpatient Medications:    buPROPion  (WELLBUTRIN  XL) 150 MG 24 hr tablet, Take 1 tablet (150 mg total) by mouth daily., Disp: 30 tablet, Rfl: 11   KESIMPTA  20 MG/0.4ML SOAJ, Inject 0.4 mLs into the skin every 30 (thirty) days., Disp: 1.2 mL, Rfl: 1   meloxicam  (MOBIC ) 15 MG tablet,  Take 15 mg by mouth daily., Disp: , Rfl:    omeprazole  (PRILOSEC) 40 MG capsule, TAKE 1 CAPSULE(40 MG) BY MOUTH DAILY, Disp: 90 capsule, Rfl: 3   VITAMIN D PO, Take 5,000 Units by mouth daily., Disp: , Rfl:    amphetamine -dextroamphetamine  (ADDERALL) 10 MG tablet, Take 2 po in the morning and 1 po around noon., Disp: 90 tablet, Rfl: 0   losartan  (COZAAR ) 100 MG tablet, Take 1 tablet (100 mg total) by mouth daily., Disp: 90 tablet, Rfl: 3 No current facility-administered medications for this visit.  Facility-Administered Medications Ordered in Other Visits:    gadopentetate dimeglumine  (MAGNEVIST ) injection 20 mL, 20 mL, Intravenous, Once PRN, Emina Ribaudo, Charlie LABOR, MD  PAST MEDICAL HISTORY: Past Medical History:  Diagnosis Date   Atrial fibrillation (HCC)    Hypertension    MS (multiple sclerosis)    Vision abnormalities     PAST SURGICAL HISTORY: Past Surgical History:  Procedure Laterality Date  CERVICAL FUSION  2005, 2014    FAMILY HISTORY: Family History  Problem Relation Age of Onset   Lupus Mother    Healthy Father    Alcohol  abuse Paternal Uncle    Lymphoma Maternal Grandfather    Colon cancer Paternal Grandmother    Alcohol  abuse Paternal Grandfather    Kidney cancer Neg Hx    Bladder Cancer Neg Hx    Prostate cancer Neg Hx    Multiple sclerosis Neg Hx     SOCIAL HISTORY:  Social History   Socioeconomic History   Marital status: Married    Spouse name: teresa   Number of children: 3   Years of education: Not on file   Highest education level: High school graduate  Occupational History   Not on file  Tobacco Use   Smoking status: Former    Current packs/day: 0.00    Types: Cigarettes    Quit date: 2003    Years since quitting: 22.8   Smokeless tobacco: Never  Vaping Use   Vaping status: Never Used  Substance and Sexual Activity   Alcohol  use: Not Currently    Comment: social   Drug use: No   Sexual activity: Yes  Other Topics Concern   Not on file   Social History Narrative   Pt works    Social Drivers of Corporate investment banker Strain: Low Risk  (11/12/2018)   Overall Financial Resource Strain (CARDIA)    Difficulty of Paying Living Expenses: Not hard at all  Food Insecurity: No Food Insecurity (11/12/2018)   Hunger Vital Sign    Worried About Running Out of Food in the Last Year: Never true    Ran Out of Food in the Last Year: Never true  Transportation Needs: No Transportation Needs (11/12/2018)   PRAPARE - Administrator, Civil Service (Medical): No    Lack of Transportation (Non-Medical): No  Physical Activity: Sufficiently Active (11/12/2018)   Exercise Vital Sign    Days of Exercise per Week: 4 days    Minutes of Exercise per Session: 60 min  Stress: No Stress Concern Present (11/12/2018)   [employer] of Occupational Health - Occupational Stress Questionnaire    Feeling of Stress : Only a little  Social Connections: Unknown (11/12/2018)   Social Connection and Isolation Panel    Frequency of Communication with Friends and Family: Not on file    Frequency of Social Gatherings with Friends and Family: Not on file    Attends Religious Services: Never    Active Member of Clubs or Organizations: No    Attends Banker Meetings: Never    Marital Status: Married  Catering manager Violence: Not At Risk (11/12/2018)   Humiliation, Afraid, Rape, and Kick questionnaire    Fear of Current or Ex-Partner: No    Emotionally Abused: No    Physically Abused: No    Sexually Abused: No     PHYSICAL EXAM  Vitals:   01/14/24 1522  BP: (!) 160/97  Pulse: 68  Weight: 266 lb (120.7 kg)  Height: 6' 1 (1.854 m)    Body mass index is 35.09 kg/m.  BP recheck was unchanged   General: The patient is well-developed and well-nourished and in no acute distress.     Neurologic Exam  Mental status:    The patient is alert and oriented x 3 at the time of the examination. The patient has apparent  normal recent and remote memory, with an apparently  normal attention span and concentration ability.   Speech is normal.  Cranial nerves: Extraocular movements are full.    Color vision is symmetric.  Facial strength is normal.  Trapezius and sternocleidomastoid strength is normal. No dysarthria is noted.  No obvious hearing deficits are noted.  Motor:  Muscle bulk is normal.   Tone is normal. Strength is  4++/5 in right, APB andt 5/5 elsewhere.     Sensory: Tinel's sign right .  No Phalens sign.  Sensory testing is altered in the palm and dorsum adjacent to the right second and third fingers..  Leg sensation was symmetric  Coordination: Cerebellar testing shows good finger nose finger and mildly reduced left heel-to-shin.  Gait and station: Station is normal.  Gait is normal.  Tandem gait is slightly wide.  Romberg is negative  Reflexes: Deep tendon reflexes are symmetric and 3 at knees and 2 elsewhere.  There is no ankle clonus    DIAGNOSTIC DATA (LABS, IMAGING, TESTING) - I reviewed patient records, labs, notes, testing and imaging myself where available.  Lab Results  Component Value Date   WBC 7.1 06/11/2023   HGB 16.1 06/11/2023   HCT 48.0 06/11/2023   MCV 88 06/11/2023   PLT 296 06/11/2023      Component Value Date/Time   NA 144 05/10/2021 1151   K 4.7 05/10/2021 1151   CL 103 05/10/2021 1151   CO2 25 05/10/2021 1151   GLUCOSE 76 05/10/2021 1151   GLUCOSE 125 (H) 09/24/2018 0428   BUN 16 05/10/2021 1151   CREATININE 0.99 05/10/2021 1151   CALCIUM  9.9 05/10/2021 1151   PROT 7.0 05/10/2021 1151   ALBUMIN 4.9 05/10/2021 1151   AST 8 05/10/2021 1151   ALT 12 05/10/2021 1151   ALKPHOS 61 05/10/2021 1151   BILITOT 0.3 05/10/2021 1151   GFRNONAA 103 12/29/2018 1121   GFRAA 119 12/29/2018 1121   _________________________  Multiple sclerosis - Plan: CBC with Differential/Platelets, IgG, IgA, IgM  High risk medication use - Plan: CBC with Differential/Platelets, IgG,  IgA, IgM  Left optic neuritis  Abnormal laboratory test  Attention deficit disorder (ADD) in adult  Essential hypertension  Bilateral carpal tunnel syndrome   1.  Continue Kesimpta  injections.   Check CBC/D and IgG/IgM 2.   continue Adderall to 20 mg qAM and 10 mg q noon 3.   Stay active and exercise as tolerated. 4.   Increase losartan  for his hypertension.  He is advised to find a primary care provider.    He should check BP in 1-2 weeks.     5.   If the hand pain worsens consider NCV/EMG to evaluate the probable CTS 6.   He will return to see us  in 6 months or sooner if there are new or worsening neurologic symptoms.

## 2024-01-15 ENCOUNTER — Ambulatory Visit: Payer: Self-pay | Admitting: Neurology

## 2024-01-15 LAB — CBC WITH DIFFERENTIAL/PLATELET
Basophils Absolute: 0 x10E3/uL (ref 0.0–0.2)
Basos: 1 %
EOS (ABSOLUTE): 0.1 x10E3/uL (ref 0.0–0.4)
Eos: 2 %
Hematocrit: 48.3 % (ref 37.5–51.0)
Hemoglobin: 16 g/dL (ref 13.0–17.7)
Immature Grans (Abs): 0 x10E3/uL (ref 0.0–0.1)
Immature Granulocytes: 0 %
Lymphocytes Absolute: 1.4 x10E3/uL (ref 0.7–3.1)
Lymphs: 23 %
MCH: 29.7 pg (ref 26.6–33.0)
MCHC: 33.1 g/dL (ref 31.5–35.7)
MCV: 90 fL (ref 79–97)
Monocytes Absolute: 0.7 x10E3/uL (ref 0.1–0.9)
Monocytes: 12 %
Neutrophils Absolute: 3.8 x10E3/uL (ref 1.4–7.0)
Neutrophils: 62 %
Platelets: 293 x10E3/uL (ref 150–450)
RBC: 5.38 x10E6/uL (ref 4.14–5.80)
RDW: 13.3 % (ref 11.6–15.4)
WBC: 6.1 x10E3/uL (ref 3.4–10.8)

## 2024-01-15 LAB — IGG, IGA, IGM
IgA/Immunoglobulin A, Serum: 104 mg/dL (ref 90–386)
IgG (Immunoglobin G), Serum: 548 mg/dL — ABNORMAL LOW (ref 603–1613)
IgM (Immunoglobulin M), Srm: 18 mg/dL — ABNORMAL LOW (ref 20–172)

## 2024-02-10 ENCOUNTER — Encounter: Payer: Self-pay | Admitting: Neurology

## 2024-02-18 ENCOUNTER — Other Ambulatory Visit: Payer: Self-pay | Admitting: Neurology

## 2024-02-18 NOTE — Telephone Encounter (Signed)
 Rx was D/c'd by Dr.Sater at [facility] on 01/14/24

## 2024-02-23 ENCOUNTER — Other Ambulatory Visit: Payer: Self-pay | Admitting: Neurology

## 2024-02-23 MED ORDER — AMPHETAMINE-DEXTROAMPHETAMINE 10 MG PO TABS: ORAL_TABLET | ORAL | 0 refills | Status: DC

## 2024-02-23 NOTE — Telephone Encounter (Signed)
 Patient request refill for amphetamine -dextroamphetamine  (ADDERALL) 10 MG tablet send to  [facility] [phone]

## 2024-02-23 NOTE — Telephone Encounter (Signed)
 Requested Prescriptions   Pending Prescriptions Disp Refills   amphetamine -dextroamphetamine  (ADDERALL) 10 MG tablet 90 tablet 0    Sig: Take 2 po in the morning and 1 po around noon.   Last seen 01/14/24 Next appt not scheduled  Dispenses   Dispensed Days Supply Quantity Provider Pharmacy  D-AMPHETAMINE  SALT COMBO  10MG  TAB 01/20/2024 30 90 each Sater, Charlie LABOR, MD [facility] #...  D-AMPHETAMINE  SALT COMBO 10MG  TAB 12/17/2023 30 90 each Camara, Amadou, MD [facility] #...  D-AMPHETAMINE  SALT COMBO 10MG  TAB 11/13/2023 30 90 each Sater, Charlie LABOR, MD [facility] #...  D-AMPHETAMINE  SALT COMBO 10MG  TAB 10/13/2023 30 90 each Sater, Charlie LABOR, MD [facility] #...  D-AMPHETAMINE  SALT COMBO 10MG  TAB 09/11/2023 30 90 each Sater, Charlie LABOR, MD [facility] #...  D-AMPHETAMINE  SALT COMBO 10MG  TAB 08/06/2023 30 90 each Sater, Charlie LABOR, MD [facility] #...  D-AMPHETAMINE  SALT COMBO 10MG  TAB 06/11/2023 30 90 each Sater, Charlie LABOR, MD [facility] #.[email or web address]

## 2024-04-02 ENCOUNTER — Telehealth: Admitting: Physician Assistant

## 2024-04-02 DIAGNOSIS — U071 COVID-19: Secondary | ICD-10-CM

## 2024-04-02 MED ORDER — NIRMATRELVIR/RITONAVIR (PAXLOVID)TABLET: 3.0000 | ORAL_TABLET | Freq: Two times a day (BID) | ORAL | 0 refills | Status: AC

## 2024-04-02 MED ORDER — FLUTICASONE PROPIONATE 50 MCG/ACT NA SUSP: 2.0000 | Freq: Every day | NASAL | 0 refills | Status: AC

## 2024-04-02 MED ORDER — PROMETHAZINE-DM 6.25-15 MG/5ML PO SYRP: 5.0000 mL | ORAL_SOLUTION | Freq: Four times a day (QID) | ORAL | 0 refills | Status: AC | PRN

## 2024-04-02 NOTE — Patient Instructions (Signed)
 " Sean Espinoza, thank you for joining [name], PA-C for today's virtual visit.  While this provider is not your primary care provider (PCP), if your PCP is located in our provider database this encounter information will be shared with them immediately following your visit.   A [facility] MyChart account gives you access to today's visit and all your visits, tests, and labs performed at [facility]  click here if you don't have a  MyChart account or go to [email or web address]  Consent: (Patient) Sean Espinoza provided verbal consent for this virtual visit at the beginning of the encounter.  Current Medications:  Current Outpatient Medications:    fluticasone (FLONASE) 50 MCG/ACT nasal spray, Place 2 sprays into both nostrils daily., Disp: 16 g, Rfl: 0   nirmatrelvir/ritonavir (PAXLOVID) 20 x 150 MG & 10 x 100MG  TABS, Take 3 tablets by mouth 2 (two) times daily for 5 days. (Take nirmatrelvir 150 mg two tablets twice daily for 5 days and ritonavir 100 mg one tablet twice daily for 5 days) Patient GFR is 97, Disp: 30 tablet, Rfl: 0   promethazine -dextromethorphan (PROMETHAZINE -DM) 6.25-15 MG/5ML syrup, Take 5 mLs by mouth 4 (four) times daily as needed., Disp: 118 mL, Rfl: 0   amphetamine -dextroamphetamine  (ADDERALL) 10 MG tablet, Take 2 po in the morning and 1 po around noon., Disp: 90 tablet, Rfl: 0   buPROPion  (WELLBUTRIN  XL) 150 MG 24 hr tablet, Take 1 tablet (150 mg total) by mouth daily., Disp: 30 tablet, Rfl: 11   KESIMPTA  20 MG/0.4ML SOAJ, Inject 0.4 mLs into the skin every 30 (thirty) days., Disp: 1.2 mL, Rfl: 1   losartan  (COZAAR ) 100 MG tablet, Take 1 tablet (100 mg total) by mouth daily., Disp: 90 tablet, Rfl: 3   meloxicam  (MOBIC ) 15 MG tablet, Take 15 mg by mouth daily., Disp: , Rfl:    omeprazole  (PRILOSEC) 40 MG capsule, TAKE 1 CAPSULE(40 MG) BY MOUTH DAILY, Disp: 90 capsule, Rfl: 3   VITAMIN D PO, Take 5,000 Units by mouth daily.,  Disp: , Rfl:  No current facility-administered medications for this visit.  Facility-Administered Medications Ordered in Other Visits:    gadopentetate dimeglumine  (MAGNEVIST ) injection 20 mL, 20 mL, Intravenous, Once PRN, Sater, Charlie LABOR, MD   Medications ordered in this encounter:  Meds ordered this encounter  Medications   nirmatrelvir/ritonavir (PAXLOVID) 20 x 150 MG & 10 x 100MG  TABS    Sig: Take 3 tablets by mouth 2 (two) times daily for 5 days. (Take nirmatrelvir 150 mg two tablets twice daily for 5 days and ritonavir 100 mg one tablet twice daily for 5 days) Patient GFR is 97    Dispense:  30 tablet    Refill:  0    Supervising Provider:   BLAISE ALEENE KIDD [[id number]]   promethazine -dextromethorphan (PROMETHAZINE -DM) 6.25-15 MG/5ML syrup    Sig: Take 5 mLs by mouth 4 (four) times daily as needed.    Dispense:  118 mL    Refill:  0    Supervising Provider:   LAMPTEY, PHILIP O [[id number]]   fluticasone (FLONASE) 50 MCG/ACT nasal spray    Sig: Place 2 sprays into both nostrils daily.    Dispense:  16 g    Refill:  0    Supervising Provider:   BLAISE ALEENE KIDD [[id number]]     *If you need refills on other medications prior to your next appointment, please contact your pharmacy*  Follow-Up: Call back or seek an in-person evaluation if the  symptoms worsen or if the condition fails to improve as anticipated.  [facility] [phone]  Care Instructions: Can take to lessen severity (if able): Vit C 500mg  twice daily Quercertin 250-500mg  twice daily Zinc 75-100mg  daily Melatonin 3-6 mg at bedtime Vit D3 1000-2000 IU daily Aspirin  81 mg daily with food Optional: Famotidine 20mg  daily Also can add tylenol /ibuprofen as needed for fevers and body aches May add Mucinex or Mucinex DM as needed for cough/congestion    Isolation Instructions: You are to isolate at home until you have been fever free for at least 24 hours without a fever-reducing medication, and  symptoms have been steadily improving for 24 hours. At that time,  you can end isolation but need to mask for an additional 5 days.   If you must be around other household members who do not have symptoms, you need to make sure that both you and the family members are masking consistently with a high-quality mask.  If you note any worsening of symptoms despite treatment, please seek an in-person evaluation ASAP. If you note any significant shortness of breath or any chest pain, please seek ER evaluation. Please do not delay care!   COVID-19: What to Do if You Are Sick If you test positive and are an older adult or someone who is at high risk of getting very sick from COVID-19, treatment may be available. Contact a healthcare provider right away after a positive test to determine if you are eligible, even if your symptoms are mild right now. You can also visit a Test to Treat location and, if eligible, receive a prescription from a provider. Don't delay: Treatment must be started within the first few days to be effective. If you have a fever, cough, or other symptoms, you might have COVID-19. Most people have mild illness and are able to recover at home. If you are sick: Keep track of your symptoms. If you have an emergency warning sign (including trouble breathing), call 911. Steps to help prevent the spread of COVID-19 if you are sick If you are sick with COVID-19 or think you might have COVID-19, follow the steps below to care for yourself and to help protect other people in your home and community. Stay home except to get medical care Stay home. Most people with COVID-19 have mild illness and can recover at home without medical care. Do not leave your home, except to get medical care. Do not visit public areas and do not go to places where you are unable to wear a mask. Take care of yourself. Get rest and stay hydrated. Take over-the-counter medicines, such as acetaminophen , to help you feel  better. Stay in touch with your doctor. Call before you get medical care. Be sure to get care if you have trouble breathing, or have any other emergency warning signs, or if you think it is an emergency. Avoid public transportation, ride-sharing, or taxis if possible. Get tested If you have symptoms of COVID-19, get tested. While waiting for test results, stay away from others, including staying apart from those living in your household. Get tested as soon as possible after your symptoms start. Treatments may be available for people with COVID-19 who are at risk for becoming very sick. Don't delay: Treatment must be started early to be effective--some treatments must begin within 5 days of your first symptoms. Contact your healthcare provider right away if your test result is positive to determine if you are eligible. Self-tests are one  of several options for testing for the virus that causes COVID-19 and may be more convenient than laboratory-based tests and point-of-care tests. Ask your healthcare provider or your local health department if you need help interpreting your test results. You can visit your state, tribal, local, and territorial health department's website to look for the latest local information on testing sites. Separate yourself from other people As much as possible, stay in a specific room and away from other people and pets in your home. If possible, you should use a separate bathroom. If you need to be around other people or animals in or outside of the home, wear a well-fitting mask. Tell your close contacts that they may have been exposed to COVID-19. An infected person can spread COVID-19 starting 48 hours (or 2 days) before the person has any symptoms or tests positive. By letting your close contacts know they may have been exposed to COVID-19, you are helping to protect everyone. See COVID-19 and Animals if you have questions about pets. If you are diagnosed with COVID-19,  someone from the health department may call you. Answer the call to slow the spread. Monitor your symptoms Symptoms of COVID-19 include fever, cough, or other symptoms. Follow care instructions from your healthcare provider and local health department. Your local health authorities may give instructions on checking your symptoms and reporting information. When to seek emergency medical attention Look for emergency warning signs* for COVID-19. If someone is showing any of these signs, seek emergency medical care immediately: Trouble breathing Persistent pain or pressure in the chest New confusion Inability to wake or stay awake Pale, gray, or blue-colored skin, lips, or nail beds, depending on skin tone *This list is not all possible symptoms. Please call your medical provider for any other symptoms that are severe or concerning to you. Call 911 or call ahead to your local emergency facility: Notify the operator that you are seeking care for someone who has or may have COVID-19. Call ahead before visiting your doctor Call ahead. Many medical visits for routine care are being postponed or done by phone or telemedicine. If you have a medical appointment that cannot be postponed, call your doctor's office, and tell them you have or may have COVID-19. This will help the office protect themselves and other patients. If you are sick, wear a well-fitting mask You should wear a mask if you must be around other people or animals, including pets (even at home). Wear a mask with the best fit, protection, and comfort for you. You don't need to wear the mask if you are alone. If you can't put on a mask (because of trouble breathing, for example), cover your coughs and sneezes in some other way. Try to stay at least 6 feet away from other people. This will help protect the people around you. Masks should not be placed on young children under age 12 years, anyone who has trouble breathing, or anyone who is not  able to remove the mask without help. Cover your coughs and sneezes Cover your mouth and nose with a tissue when you cough or sneeze. Throw away used tissues in a lined trash can. Immediately wash your hands with soap and water for at least 20 seconds. If soap and water are not available, clean your hands with an alcohol -based hand sanitizer that contains at least 60% alcohol . Clean your hands often Wash your hands often with soap and water for at least 20 seconds. This is especially important after blowing  your nose, coughing, or sneezing; going to the bathroom; and before eating or preparing food. Use hand sanitizer if soap and water are not available. Use an alcohol -based hand sanitizer with at least 60% alcohol , covering all surfaces of your hands and rubbing them together until they feel dry. Soap and water are the best option, especially if hands are visibly dirty. Avoid touching your eyes, nose, and mouth with unwashed hands. Handwashing Tips Avoid sharing personal household items Do not share dishes, drinking glasses, cups, eating utensils, towels, or bedding with other people in your home. Wash these items thoroughly after using them with soap and water or put in the dishwasher. Clean surfaces in your home regularly Clean and disinfect high-touch surfaces (for example, doorknobs, tables, handles, light switches, and countertops) in your sick room and bathroom. In shared spaces, you should clean and disinfect surfaces and items after each use by the person who is ill. If you are sick and cannot clean, a caregiver or other person should only clean and disinfect the area around you (such as your bedroom and bathroom) on an as needed basis. Your caregiver/other person should wait as long as possible (at least several hours) and wear a mask before entering, cleaning, and disinfecting shared spaces that you use. Clean and disinfect areas that may have blood, stool, or body fluids on them. Use  household cleaners and disinfectants. Clean visible dirty surfaces with household cleaners containing soap or detergent. Then, use a household disinfectant. Use a product from [employer] List N: Disinfectants for Coronavirus (COVID-19). Be sure to follow the instructions on the label to ensure safe and effective use of the product. Many products recommend keeping the surface wet with a disinfectant for a certain period of time (look at contact time on the product label). You may also need to wear personal protective equipment, such as gloves, depending on the directions on the product label. Immediately after disinfecting, wash your hands with soap and water for 20 seconds. For completed guidance on cleaning and disinfecting your home, visit Complete Disinfection Guidance. Take steps to improve ventilation at home Improve ventilation (air flow) at home to help prevent from spreading COVID-19 to other people in your household. Clear out COVID-19 virus particles in the air by opening windows, using air filters, and turning on fans in your home. Use this interactive tool to learn how to improve air flow in your home. When you can be around others after being sick with COVID-19 Deciding when you can be around others is different for different situations. Find out when you can safely end home isolation. For any additional questions about your care, contact your healthcare provider or state or local health department. 06/20/2020 Content source: [facility] ([facility]), Division of Viral Diseases This information is not intended to replace advice given to you by your health care provider. Make sure you discuss any questions you have with your health care provider. Document Revised: 08/03/2020 Document Reviewed: 08/03/2020 Elsevier Patient Education  2022 [employer].     If you have been instructed to have an in-person evaluation today at a local Urgent Care  facility, please use the link below. It will take you to a list of all of our available [facility], including address, phone number and hours of operation. Please do not delay care.  [facility]  If you or a family member do not have a primary care provider, use the link below to schedule  a visit and establish care. When you choose a [facility] primary care physician or advanced practice provider, you gain a long-term partner in health. Find a Primary Care Provider  Learn more about [facility]'s in-office and virtual care options: [facility] - Get Care Now "

## 2024-04-02 NOTE — Progress Notes (Signed)
 " Virtual Visit Consent   Sean Espinoza, you are scheduled for a virtual visit with a [facility] provider today. Just as with appointments in the office, your consent must be obtained to participate. Your consent will be active for this visit and any virtual visit you may have with one of our providers in the next 365 days. If you have a MyChart account, a copy of this consent can be sent to you electronically.  As this is a virtual visit, video technology does not allow for your provider to perform a traditional examination. This may limit your provider's ability to fully assess your condition. If your provider identifies any concerns that need to be evaluated in person or the need to arrange testing (such as labs, EKG, etc.), we will make arrangements to do so. Although advances in technology are sophisticated, we cannot ensure that it will always work on either your end or our end. If the connection with a video visit is poor, the visit may have to be switched to a telephone visit. With either a video or telephone visit, we are not always able to ensure that we have a secure connection.  By engaging in this virtual visit, you consent to the provision of healthcare and authorize for your insurance to be billed (if applicable) for the services provided during this visit. Depending on your insurance coverage, you may receive a charge related to this service.  I need to obtain your verbal consent now. Are you willing to proceed with your visit today? Sean Espinoza has provided verbal consent on 04/02/2024 for a virtual visit (video or telephone). Sean CHRISTELLA Dickinson, PA-C  Date: 04/02/2024 12:44 PM   Virtual Visit via Video Note   I, Sean Espinoza, connected with  Sean Espinoza  ([id number], [birth year]) on 04/02/2024 at 12:30 PM EST by a video-enabled telemedicine application and verified that I am speaking with the correct person using two identifiers.  Location: Patient: Virtual Visit  Location Patient: Home Provider: Virtual Visit Location Provider: Home Office   I discussed the limitations of evaluation and management by telemedicine and the availability of in person appointments. The patient expressed understanding and agreed to proceed.    History of Present Illness: Sean Espinoza is a 49 y.o. who identifies as a male who was assigned male at birth, and is being seen today for URI symptoms.  HPI: URI  This is a new problem. The current episode started yesterday. Maximum temperature: low grade. Associated symptoms include congestion, coughing (productive but swallowing), headaches, rhinorrhea (and post nasal drainage) and a sore throat. Pertinent negatives include no chest pain, diarrhea, ear pain, nausea, plugged ear sensation, sinus pain, vomiting or wheezing. Associated symptoms comments: Dizziness, chills, tinnitus bilat but R>L, hears rattling with breathing. Treatments tried: tylenol  severe cold and flu, robitussin. The treatment provided no relief.  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   Mixed hyperlipidemia 09/01/2016   Metabolic syndrome 09/01/2016   GERD (gastroesophageal reflux disease) 09/01/2016  Essential hypertension 09/01/2016   Class 1 obesity due to excess calories with serious comorbidity and body mass index (BMI) of 31.0 to 31.9 in adult 09/01/2016   Cellulitis of left axilla 09/01/2016   Cellulitis of left lower extremity 09/01/2016   Anxiety  09/01/2016   Sciatica, left side 08/27/2016   Multiple sclerosis 07/17/2016   Gait disturbance 07/17/2016   Left optic neuritis 07/17/2016   Numbness and tingling in right hand 07/17/2016   Blurred vision, left eye    TIA (transient ischemic attack) 07/01/2016    Allergies: Allergies[1] Medications: Current Medications[2]  Observations/Objective: Patient is well-developed, well-nourished in no acute distress.  Resting comfortably at home.  Head is normocephalic, atraumatic.  No labored breathing.  Speech is clear and coherent with logical content.  Patient is alert and oriented at baseline.    Assessment and Plan: 1. COVID-19 (Primary) - nirmatrelvir/ritonavir (PAXLOVID) 20 x 150 MG & 10 x 100MG  TABS; Take 3 tablets by mouth 2 (two) times daily for 5 days. (Take nirmatrelvir 150 mg two tablets twice daily for 5 days and ritonavir 100 mg one tablet twice daily for 5 days) Patient GFR is 97  Dispense: 30 tablet; Refill: 0 - promethazine -dextromethorphan (PROMETHAZINE -DM) 6.25-15 MG/5ML syrup; Take 5 mLs by mouth 4 (four) times daily as needed.  Dispense: 118 mL; Refill: 0 - fluticasone (FLONASE) 50 MCG/ACT nasal spray; Place 2 sprays into both nostrils daily.  Dispense: 16 g; Refill: 0  - Continue OTC symptomatic management of choice - Will send OTC vitamins and supplement information through AVS - Paxlovid prescribed - Promethazine  DM for cough - Flonase for nasal congestion and drainage - Push fluids - Rest as needed - Discussed return precautions and when to seek in-person evaluation, sent via AVS as well   Follow Up Instructions: I discussed the assessment and treatment plan with the patient. The patient was provided an opportunity to ask questions and all were answered. The patient agreed with the plan and demonstrated an understanding of the instructions.  A copy of instructions were sent to the patient via MyChart unless otherwise noted below.    The patient was advised  to call back or seek an in-person evaluation if the symptoms worsen or if the condition fails to improve as anticipated.    Sean HERO Desman Polak, PA-C     [1] No Known Allergies [2]  Current Outpatient Medications:    fluticasone (FLONASE) 50 MCG/ACT nasal spray, Place 2 sprays into both nostrils daily., Disp: 16 g, Rfl: 0   nirmatrelvir/ritonavir (PAXLOVID) 20 x 150 MG & 10 x 100MG  TABS, Take 3 tablets by mouth 2 (two) times daily for 5 days. (Take nirmatrelvir 150 mg two tablets twice daily for 5 days and ritonavir 100 mg one tablet twice daily for 5 days) Patient GFR is 97, Disp: 30 tablet, Rfl: 0   promethazine -dextromethorphan (PROMETHAZINE -DM) 6.25-15 MG/5ML syrup, Take 5 mLs by mouth 4 (four) times daily as needed., Disp: 118 mL, Rfl: 0   amphetamine -dextroamphetamine  (ADDERALL) 10 MG tablet, Take 2 po in the morning and 1 po around noon., Disp: 90 tablet, Rfl: 0   buPROPion  (WELLBUTRIN  XL) 150 MG 24 hr tablet, Take 1 tablet (150 mg total) by mouth daily., Disp: 30 tablet, Rfl: 11   KESIMPTA  20 MG/0.4ML SOAJ, Inject 0.4 mLs into the skin every 30 (thirty) days., Disp: 1.2 mL, Rfl: 1   losartan  (COZAAR ) 100 MG tablet, Take 1 tablet (100 mg total) by mouth daily., Disp: 90 tablet, Rfl: 3  meloxicam  (MOBIC ) 15 MG tablet, Take 15 mg by mouth daily., Disp: , Rfl:    omeprazole  (PRILOSEC) 40 MG capsule, TAKE 1 CAPSULE(40 MG) BY MOUTH DAILY, Disp: 90 capsule, Rfl: 3   VITAMIN D PO, Take 5,000 Units by mouth daily., Disp: , Rfl:  No current facility-administered medications for this visit.  Facility-Administered Medications Ordered in Other Visits:    gadopentetate dimeglumine  (MAGNEVIST ) injection 20 mL, 20 mL, Intravenous, Once PRN, Sater, Charlie LABOR, MD  "

## 2024-04-19 ENCOUNTER — Telehealth: Payer: Self-pay | Admitting: Neurology

## 2024-04-19 NOTE — Telephone Encounter (Signed)
Pt is requesting a refill for amphetamine-dextroamphetamine (ADDERALL) 20 MG tablet.  Pharmacy: [facility] [phone]

## 2024-04-20 MED ORDER — AMPHETAMINE-DEXTROAMPHETAMINE 10 MG PO TABS: ORAL_TABLET | ORAL | 0 refills | Status: AC

## 2024-04-20 NOTE — Telephone Encounter (Signed)
 ROUTING TO PHONE ROOM TO SCHEDULE FOLLOW UP AND TO DR VEAR TO REFILL Last seen 01/14/24 Next appt not scheduled   Dispenses   Dispensed Days Supply Quantity Provider Pharmacy  D-AMPHETAMINE  SALT COMBO  10MG  TAB 02/27/2024 30 90 each Espinoza, Sean LABOR, MD [facility]...  D-AMPHETAMINE  SALT COMBO  10MG  TAB 01/20/2024 30 90 each Espinoza, Sean LABOR, MD [facility] #...  D-AMPHETAMINE  SALT COMBO 10MG  TAB 12/17/2023 30 90 each Camara, Amadou, MD [facility] #...  D-AMPHETAMINE  SALT COMBO 10MG  TAB 11/13/2023 30 90 each Espinoza, Sean LABOR, MD [facility] #...  D-AMPHETAMINE  SALT COMBO 10MG  TAB 10/13/2023 30 90 each Espinoza, Sean LABOR, MD [facility] #...  D-AMPHETAMINE  SALT COMBO 10MG  TAB 09/11/2023 30 90 each Espinoza, Sean LABOR, MD [facility] #...  D-AMPHETAMINE  SALT COMBO 10MG  TAB 08/06/2023 30 90 each Espinoza, Sean LABOR, MD [facility]ORE #...  D-AMPHETAMINE  SALT COMBO 10MG  TAB 06/11/2023 30 90 each Espinoza, Sean LABOR, MD [facility] #...  DEXTROAMP-AMPHETAMIN 20 MG TAB 04/24/2023 30 60 each Espinoza, Sean LABOR, MD [facility] [phone] - [email or web address]
